# Patient Record
Sex: Male | Born: 1955
Health system: Southern US, Community
[De-identification: ages and names within clinical notes are randomized; demographics above are authoritative.]

## PROBLEM LIST (undated history)

## (undated) DIAGNOSIS — M1990 Unspecified osteoarthritis, unspecified site: Secondary | ICD-10-CM

## (undated) DIAGNOSIS — K9189 Other postprocedural complications and disorders of digestive system: Secondary | ICD-10-CM

## (undated) DIAGNOSIS — C4491 Basal cell carcinoma of skin, unspecified: Secondary | ICD-10-CM

## (undated) DIAGNOSIS — H9192 Unspecified hearing loss, left ear: Secondary | ICD-10-CM

## (undated) DIAGNOSIS — R001 Bradycardia, unspecified: Secondary | ICD-10-CM

## (undated) DIAGNOSIS — Z860100 Personal history of colon polyps, unspecified: Secondary | ICD-10-CM

## (undated) DIAGNOSIS — I10 Essential (primary) hypertension: Secondary | ICD-10-CM

## (undated) DIAGNOSIS — Q8789 Other specified congenital malformation syndromes, not elsewhere classified: Secondary | ICD-10-CM

## (undated) DIAGNOSIS — Z9289 Personal history of other medical treatment: Secondary | ICD-10-CM

## (undated) DIAGNOSIS — E785 Hyperlipidemia, unspecified: Secondary | ICD-10-CM

## (undated) DIAGNOSIS — E278 Other specified disorders of adrenal gland: Secondary | ICD-10-CM

## (undated) DIAGNOSIS — K567 Ileus, unspecified: Secondary | ICD-10-CM

## (undated) DIAGNOSIS — J189 Pneumonia, unspecified organism: Secondary | ICD-10-CM

## (undated) DIAGNOSIS — I499 Cardiac arrhythmia, unspecified: Secondary | ICD-10-CM

## (undated) DIAGNOSIS — C4442 Squamous cell carcinoma of skin of scalp and neck: Secondary | ICD-10-CM

## (undated) DIAGNOSIS — J9383 Other pneumothorax: Secondary | ICD-10-CM

## (undated) DIAGNOSIS — Z87442 Personal history of urinary calculi: Secondary | ICD-10-CM

## (undated) HISTORY — PX: TENDON REPAIR: SHX5111

## (undated) HISTORY — PX: NASAL SEPTUM SURGERY: SHX37

## (undated) HISTORY — DX: Essential (primary) hypertension: I10

## (undated) HISTORY — DX: Personal history of colon polyps, unspecified: Z86.0100

## (undated) HISTORY — PX: SKIN CANCER EXCISION: SHX779

## (undated) HISTORY — DX: Hyperlipidemia, unspecified: E78.5

## (undated) HISTORY — DX: Personal history of colonic polyps: Z86.010

## (undated) HISTORY — PX: HERNIA REPAIR: SHX51

## (undated) HISTORY — PX: LUNG SURGERY: SHX703

## (undated) HISTORY — DX: Bradycardia, unspecified: R00.1

## (undated) HISTORY — PX: KNEE ARTHROSCOPY: SHX127

---
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
## 1993-02-20 DIAGNOSIS — J9383 Other pneumothorax: Secondary | ICD-10-CM

## 1993-02-20 HISTORY — DX: Other pneumothorax: J93.83

## 1998-04-05 ENCOUNTER — Ambulatory Visit (HOSPITAL_COMMUNITY): Admission: RE | Admit: 1998-04-05 | Discharge: 1998-04-05 | Payer: Self-pay | Admitting: Psychiatry

## 1998-10-22 DIAGNOSIS — C4491 Basal cell carcinoma of skin, unspecified: Secondary | ICD-10-CM

## 1998-10-22 HISTORY — DX: Basal cell carcinoma of skin, unspecified: C44.91

## 1999-02-21 HISTORY — PX: ELBOW SURGERY: SHX618

## 2000-07-17 ENCOUNTER — Encounter (INDEPENDENT_AMBULATORY_CARE_PROVIDER_SITE_OTHER): Payer: Self-pay | Admitting: Specialist

## 2000-07-17 ENCOUNTER — Ambulatory Visit (HOSPITAL_BASED_OUTPATIENT_CLINIC_OR_DEPARTMENT_OTHER): Admission: RE | Admit: 2000-07-17 | Discharge: 2000-07-17 | Payer: Self-pay | Admitting: Otolaryngology

## 2002-02-20 HISTORY — PX: CHOLECYSTECTOMY: SHX55

## 2002-04-24 ENCOUNTER — Encounter: Payer: Self-pay | Admitting: Surgery

## 2002-04-30 ENCOUNTER — Observation Stay (HOSPITAL_COMMUNITY): Admission: RE | Admit: 2002-04-30 | Discharge: 2002-05-01 | Payer: Self-pay | Admitting: Surgery

## 2002-04-30 ENCOUNTER — Encounter (INDEPENDENT_AMBULATORY_CARE_PROVIDER_SITE_OTHER): Payer: Self-pay | Admitting: *Deleted

## 2002-04-30 ENCOUNTER — Encounter: Payer: Self-pay | Admitting: Surgery

## 2004-02-25 ENCOUNTER — Ambulatory Visit: Payer: Self-pay | Admitting: Internal Medicine

## 2004-06-27 ENCOUNTER — Encounter: Admission: RE | Admit: 2004-06-27 | Discharge: 2004-06-27 | Payer: Self-pay | Admitting: Internal Medicine

## 2004-06-27 ENCOUNTER — Ambulatory Visit: Payer: Self-pay | Admitting: Internal Medicine

## 2004-11-01 ENCOUNTER — Ambulatory Visit: Payer: Self-pay | Admitting: Internal Medicine

## 2005-02-24 ENCOUNTER — Ambulatory Visit: Payer: Self-pay | Admitting: Internal Medicine

## 2005-02-28 ENCOUNTER — Encounter: Admission: RE | Admit: 2005-02-28 | Discharge: 2005-02-28 | Payer: Self-pay | Admitting: Internal Medicine

## 2005-06-16 ENCOUNTER — Ambulatory Visit: Payer: Self-pay | Admitting: Internal Medicine

## 2005-06-20 HISTORY — PX: COLONOSCOPY: SHX174

## 2005-06-23 ENCOUNTER — Ambulatory Visit: Payer: Self-pay | Admitting: Internal Medicine

## 2005-07-03 ENCOUNTER — Ambulatory Visit: Payer: Self-pay | Admitting: Gastroenterology

## 2005-07-14 ENCOUNTER — Encounter (INDEPENDENT_AMBULATORY_CARE_PROVIDER_SITE_OTHER): Payer: Self-pay | Admitting: Specialist

## 2005-07-14 ENCOUNTER — Encounter: Payer: Self-pay | Admitting: Internal Medicine

## 2005-07-14 ENCOUNTER — Ambulatory Visit: Payer: Self-pay | Admitting: Gastroenterology

## 2005-07-18 ENCOUNTER — Encounter: Payer: Self-pay | Admitting: Internal Medicine

## 2005-12-22 ENCOUNTER — Ambulatory Visit: Payer: Self-pay | Admitting: Internal Medicine

## 2006-03-09 ENCOUNTER — Ambulatory Visit: Payer: Self-pay | Admitting: Internal Medicine

## 2006-07-03 ENCOUNTER — Ambulatory Visit: Payer: Self-pay | Admitting: Internal Medicine

## 2006-10-02 DIAGNOSIS — I10 Essential (primary) hypertension: Secondary | ICD-10-CM | POA: Insufficient documentation

## 2006-10-02 DIAGNOSIS — J309 Allergic rhinitis, unspecified: Secondary | ICD-10-CM | POA: Insufficient documentation

## 2006-10-25 ENCOUNTER — Ambulatory Visit: Payer: Self-pay | Admitting: Internal Medicine

## 2006-10-25 LAB — CONVERTED CEMR LAB
ALT: 31 units/L (ref 0–53)
AST: 21 units/L (ref 0–37)
Albumin: 4.2 g/dL (ref 3.5–5.2)
Alkaline Phosphatase: 66 units/L (ref 39–117)
BUN: 16 mg/dL (ref 6–23)
Basophils Absolute: 0 10*3/uL (ref 0.0–0.1)
Basophils Relative: 0.1 % (ref 0.0–1.0)
Bilirubin, Direct: 0.1 mg/dL (ref 0.0–0.3)
Blood in Urine, dipstick: NEGATIVE
CO2: 30 meq/L (ref 19–32)
Calcium: 9.4 mg/dL (ref 8.4–10.5)
Chloride: 104 meq/L (ref 96–112)
Cholesterol: 164 mg/dL (ref 0–200)
Creatinine, Ser: 1 mg/dL (ref 0.4–1.5)
Direct LDL: 123.1 mg/dL
Eosinophils Absolute: 0.4 10*3/uL (ref 0.0–0.6)
Eosinophils Relative: 7.3 % — ABNORMAL HIGH (ref 0.0–5.0)
GFR calc Af Amer: 101 mL/min
GFR calc non Af Amer: 83 mL/min
Glucose, Bld: 99 mg/dL (ref 70–99)
Glucose, Urine, Semiquant: NEGATIVE
HCT: 44.3 % (ref 39.0–52.0)
HDL: 26.4 mg/dL — ABNORMAL LOW (ref >39.0)
Hemoglobin: 15.5 g/dL (ref 13.0–17.0)
Ketones, urine, test strip: NEGATIVE
LDL Cholesterol: 120 mg/dL — ABNORMAL HIGH (ref 0–99)
Lymphocytes Relative: 42.2 % (ref 12.0–46.0)
MCHC: 35.1 g/dL (ref 30.0–36.0)
MCV: 91.3 fL (ref 78.0–100.0)
Monocytes Absolute: 0.4 10*3/uL (ref 0.2–0.7)
Monocytes Relative: 8.4 % (ref 3.0–11.0)
Neutro Abs: 2.1 10*3/uL (ref 1.4–7.7)
Neutrophils Relative %: 42 % — ABNORMAL LOW (ref 43.0–77.0)
Nitrite: NEGATIVE
PSA: 0.5 ng/mL (ref 0.10–4.00)
Platelets: 206 10*3/uL (ref 150–400)
Potassium: 3.4 meq/L — ABNORMAL LOW (ref 3.5–5.1)
Protein, U semiquant: NEGATIVE
RBC: 4.86 M/uL (ref 4.22–5.81)
RDW: 12.7 % (ref 11.5–14.6)
Sodium: 141 meq/L (ref 135–145)
Specific Gravity, Urine: >=1.03
TSH: 2.16 microintl units/mL (ref 0.35–5.50)
Total Bilirubin: 1 mg/dL (ref 0.3–1.2)
Total CHOL/HDL Ratio: 6.2
Total Protein: 7.1 g/dL (ref 6.0–8.3)
Triglycerides: 88 mg/dL (ref 0–149)
Urobilinogen, UA: 0.2
VLDL: 18 mg/dL (ref 0–40)
WBC Urine, dipstick: NEGATIVE
WBC: 5 10*3/uL (ref 4.5–10.5)
pH: 5.5

## 2006-11-02 ENCOUNTER — Ambulatory Visit: Payer: Self-pay | Admitting: Internal Medicine

## 2007-06-18 ENCOUNTER — Emergency Department (HOSPITAL_COMMUNITY): Admission: EM | Admit: 2007-06-18 | Discharge: 2007-06-18 | Payer: Self-pay | Admitting: Emergency Medicine

## 2007-08-26 ENCOUNTER — Ambulatory Visit: Payer: Self-pay | Admitting: Internal Medicine

## 2007-08-26 DIAGNOSIS — J310 Chronic rhinitis: Secondary | ICD-10-CM

## 2007-09-25 ENCOUNTER — Ambulatory Visit: Payer: Self-pay | Admitting: Internal Medicine

## 2007-10-22 ENCOUNTER — Ambulatory Visit: Payer: Self-pay | Admitting: Internal Medicine

## 2007-10-22 LAB — CONVERTED CEMR LAB
ALT: 26 units/L (ref 0–53)
AST: 19 units/L (ref 0–37)
Albumin: 3.8 g/dL (ref 3.5–5.2)
BUN: 14 mg/dL (ref 6–23)
Basophils Relative: 0.1 % (ref 0.0–3.0)
CO2: 33 meq/L — ABNORMAL HIGH (ref 19–32)
Chloride: 110 meq/L (ref 96–112)
Creatinine, Ser: 0.9 mg/dL (ref 0.4–1.5)
Eosinophils Absolute: 0.3 10*3/uL (ref 0.0–0.7)
Eosinophils Relative: 6.1 % — ABNORMAL HIGH (ref 0.0–5.0)
GFR calc non Af Amer: 94 mL/min
Glucose, Urine, Semiquant: NEGATIVE
Hemoglobin: 15.2 g/dL (ref 13.0–17.0)
MCV: 91.4 fL (ref 78.0–100.0)
Neutrophils Relative %: 56.2 % (ref 43.0–77.0)
PSA: 0.59 ng/mL (ref 0.10–4.00)
RBC: 4.6 M/uL (ref 4.22–5.81)
Specific Gravity, Urine: 1.02
VLDL: 19 mg/dL (ref 0–40)
WBC Urine, dipstick: NEGATIVE
WBC: 5.3 10*3/uL (ref 4.5–10.5)
pH: 5

## 2007-10-29 ENCOUNTER — Ambulatory Visit: Payer: Self-pay | Admitting: Internal Medicine

## 2007-10-29 DIAGNOSIS — E785 Hyperlipidemia, unspecified: Secondary | ICD-10-CM

## 2008-02-24 ENCOUNTER — Ambulatory Visit: Payer: Self-pay | Admitting: Internal Medicine

## 2008-02-24 DIAGNOSIS — J069 Acute upper respiratory infection, unspecified: Secondary | ICD-10-CM | POA: Insufficient documentation

## 2008-03-27 ENCOUNTER — Ambulatory Visit: Payer: Self-pay | Admitting: Internal Medicine

## 2008-04-10 ENCOUNTER — Ambulatory Visit: Payer: Self-pay | Admitting: Internal Medicine

## 2008-04-10 DIAGNOSIS — I498 Other specified cardiac arrhythmias: Secondary | ICD-10-CM

## 2008-04-13 ENCOUNTER — Telehealth: Payer: Self-pay | Admitting: Internal Medicine

## 2008-04-13 ENCOUNTER — Emergency Department (HOSPITAL_COMMUNITY): Admission: EM | Admit: 2008-04-13 | Discharge: 2008-04-13 | Payer: Self-pay | Admitting: Emergency Medicine

## 2008-04-17 ENCOUNTER — Ambulatory Visit: Payer: Self-pay | Admitting: Internal Medicine

## 2008-04-17 DIAGNOSIS — R079 Chest pain, unspecified: Secondary | ICD-10-CM

## 2008-04-20 HISTORY — PX: OTHER SURGICAL HISTORY: SHX169

## 2008-04-23 ENCOUNTER — Encounter: Payer: Self-pay | Admitting: Internal Medicine

## 2008-04-23 ENCOUNTER — Ambulatory Visit: Payer: Self-pay

## 2008-10-23 ENCOUNTER — Ambulatory Visit: Payer: Self-pay | Admitting: Internal Medicine

## 2008-10-23 LAB — CONVERTED CEMR LAB
ALT: 34 units/L (ref 0–53)
AST: 22 units/L (ref 0–37)
Alkaline Phosphatase: 70 units/L (ref 39–117)
Eosinophils Relative: 4.7 % (ref 0.0–5.0)
GFR calc non Af Amer: 82.69 mL/min (ref >60)
HCT: 46.7 % (ref 39.0–52.0)
Hemoglobin: 16.5 g/dL (ref 13.0–17.0)
LDL Cholesterol: 140 mg/dL — ABNORMAL HIGH (ref 0–99)
Lymphocytes Relative: 32.5 % (ref 12.0–46.0)
Lymphs Abs: 1.8 10*3/uL (ref 0.7–4.0)
Monocytes Relative: 7.2 % (ref 3.0–12.0)
Neutro Abs: 3 10*3/uL (ref 1.4–7.7)
Nitrite: NEGATIVE
Platelets: 193 10*3/uL (ref 150.0–400.0)
Potassium: 3.8 meq/L (ref 3.5–5.1)
Protein, U semiquant: NEGATIVE
Sodium: 142 meq/L (ref 135–145)
TSH: 2.01 microintl units/mL (ref 0.35–5.50)
Total Bilirubin: 0.9 mg/dL (ref 0.3–1.2)
Urobilinogen, UA: 0.2
VLDL: 16.4 mg/dL (ref 0.0–40.0)
WBC Urine, dipstick: NEGATIVE
WBC: 5.5 10*3/uL (ref 4.5–10.5)

## 2008-10-30 ENCOUNTER — Ambulatory Visit: Payer: Self-pay | Admitting: Internal Medicine

## 2008-12-31 ENCOUNTER — Ambulatory Visit: Payer: Self-pay | Admitting: Internal Medicine

## 2008-12-31 DIAGNOSIS — M549 Dorsalgia, unspecified: Secondary | ICD-10-CM | POA: Insufficient documentation

## 2009-03-01 ENCOUNTER — Ambulatory Visit: Payer: Self-pay | Admitting: Internal Medicine

## 2009-04-30 ENCOUNTER — Ambulatory Visit: Payer: Self-pay | Admitting: Internal Medicine

## 2009-10-21 ENCOUNTER — Ambulatory Visit: Payer: Self-pay | Admitting: Internal Medicine

## 2009-10-21 LAB — CONVERTED CEMR LAB
AST: 19 units/L (ref 0–37)
Albumin: 4.3 g/dL (ref 3.5–5.2)
BUN: 19 mg/dL (ref 6–23)
Basophils Absolute: 0 10*3/uL (ref 0.0–0.1)
Blood in Urine, dipstick: NEGATIVE
Cholesterol: 181 mg/dL (ref 0–200)
Creatinine, Ser: 1 mg/dL (ref 0.4–1.5)
Eosinophils Absolute: 0.3 10*3/uL (ref 0.0–0.7)
HCT: 47.4 % (ref 39.0–52.0)
HDL: 30.9 mg/dL — ABNORMAL LOW (ref >39.00)
Hemoglobin: 16.5 g/dL (ref 13.0–17.0)
Ketones, urine, test strip: NEGATIVE
LDL Cholesterol: 130 mg/dL — ABNORMAL HIGH (ref 0–99)
Lymphs Abs: 1.9 10*3/uL (ref 0.7–4.0)
MCHC: 34.7 g/dL (ref 30.0–36.0)
MCV: 91.9 fL (ref 78.0–100.0)
Monocytes Relative: 6.4 % (ref 3.0–12.0)
Neutro Abs: 3.1 10*3/uL (ref 1.4–7.7)
Nitrite: NEGATIVE
Protein, U semiquant: NEGATIVE
RDW: 13.2 % (ref 11.5–14.6)
TSH: 2.62 microintl units/mL (ref 0.35–5.50)
Total CHOL/HDL Ratio: 6
Triglycerides: 103 mg/dL (ref 0.0–149.0)
Urobilinogen, UA: 0.2
VLDL: 20.6 mg/dL (ref 0.0–40.0)

## 2009-11-01 ENCOUNTER — Ambulatory Visit: Payer: Self-pay | Admitting: Internal Medicine

## 2009-12-29 ENCOUNTER — Ambulatory Visit: Payer: Self-pay | Admitting: Internal Medicine

## 2009-12-29 DIAGNOSIS — L308 Other specified dermatitis: Secondary | ICD-10-CM

## 2010-01-17 ENCOUNTER — Ambulatory Visit: Payer: Self-pay | Admitting: Internal Medicine

## 2010-03-13 ENCOUNTER — Encounter: Payer: Self-pay | Admitting: Internal Medicine

## 2010-03-24 NOTE — Assessment & Plan Note (Signed)
Summary: 6 month rov/njr   Vital Signs:  Patient profile:   55 year old male Weight:      202 pounds Temp:     98. degrees F oral BP sitting:   130 / 94  (right arm) Cuff size:   regular  Vitals Entered By: Duard Brady LPN (April 30, 2009 8:12 AM) CC: 6 mos rov - doing well Is Patient Diabetic? No   CC:  6 mos rov - doing well.  History of Present Illness: 55 year old patient who is seen today for follow-up of his hypertension.  He is doing quite well.  He does have a history of allergic rhinitis.  He has mild dyslipidemia.  He is done well with his hypertension and does monitor a home blood pressures occasionally.  He is tolerant.  His medications without difficulty.  he is  only having minimal allergy related symptoms.  Preventive Screening-Counseling & Management  Alcohol-Tobacco     Smoking Status: current  Allergies (verified): No Known Drug Allergies  Past History:  Past Medical History: Reviewed history from 12/31/2008 and no changes required. Allergic rhinitis Hypertension Colonic polyps, hx of Hyperlipidemia bradycardia  Low HDL cholesterol  Past Surgical History: Reviewed history from 10/30/2008 and no changes required. spontaneous pneumothorax  10/05 Cholecystectomy 2004 ear surgery 1971 colonoscopy 5-07 Cardiolyte Stress 3-10  Social History: Smoking Status:  current  Review of Systems  The patient denies anorexia, fever, weight loss, weight gain, vision loss, decreased hearing, hoarseness, chest pain, syncope, dyspnea on exertion, peripheral edema, prolonged cough, headaches, hemoptysis, abdominal pain, melena, hematochezia, severe indigestion/heartburn, hematuria, incontinence, genital sores, muscle weakness, suspicious skin lesions, transient blindness, difficulty walking, depression, unusual weight change, abnormal bleeding, enlarged lymph nodes, angioedema, breast masses, and testicular masses.    Physical Exam  General:   Well-developed,well-nourished,in no acute distress; alert,appropriate and cooperative throughout examination; 130/90 Head:  Normocephalic and atraumatic without obvious abnormalities. No apparent alopecia or balding. Eyes:  No corneal or conjunctival inflammation noted. EOMI. Perrla. Funduscopic exam benign, without hemorrhages, exudates or papilledema. Vision grossly normal. Mouth:  Oral mucosa and oropharynx without lesions or exudates.  Teeth in good repair. Neck:  No deformities, masses, or tenderness noted. Lungs:  Normal respiratory effort, chest expands symmetrically. Lungs are clear to auscultation, no crackles or wheezes. Heart:  Normal rate and regular rhythm. S1 and S2 normal without gallop, murmur, click, rub or other extra sounds. Abdomen:  Bowel sounds positive,abdomen soft and non-tender without masses, organomegaly or hernias noted. Msk:  No deformity or scoliosis noted of thoracic or lumbar spine.   Pulses:  R and L carotid,radial,femoral,dorsalis pedis and posterior tibial pulses are full and equal bilaterally Extremities:  No clubbing, cyanosis, edema, or deformity noted with normal full range of motion of all joints.     Impression & Recommendations:  Problem # 1:  HYPERLIPIDEMIA (ICD-272.4)  Problem # 2:  HYPERTENSION (ICD-401.9)  His updated medication list for this problem includes:    Lisinopril-hydrochlorothiazide 20-25 Mg Tabs (Lisinopril-hydrochlorothiazide) ..... One daily  His updated medication list for this problem includes:    Lisinopril-hydrochlorothiazide 20-25 Mg Tabs (Lisinopril-hydrochlorothiazide) ..... One daily  Orders: Prescription Created Electronically [phone])  Problem # 3:  ALLERGIC RHINITIS (ICD-477.9)  His updated medication list for this problem includes:    Zyrtec Allergy 10 Mg Tabs (Cetirizine hcl) .Marland Kitchen... 1 qd as needed    Fluticasone Propionate 50 Mcg/act Susp (Fluticasone propionate) .Marland Kitchen..Marland Kitchen Two puffs each side twice daily for one week,  then once daily  His  updated medication list for this problem includes:    Zyrtec Allergy 10 Mg Tabs (Cetirizine hcl) .Marland Kitchen... 1 qd as needed    Fluticasone Propionate 50 Mcg/act Susp (Fluticasone propionate) .Marland Kitchen..Marland Kitchen Two puffs each side twice daily for one week, then once daily  Complete Medication List: 1)  Zyrtec Allergy 10 Mg Tabs (Cetirizine hcl) .Marland Kitchen.. 1 qd as needed 2)  Adult Aspirin Low Strength 81 Mg Tbdp (Aspirin) .Marland Kitchen.. 1 once daily 3)  Fluticasone Propionate 50 Mcg/act Susp (Fluticasone propionate) .... Two puffs each side twice daily for one week, then once daily 4)  Lisinopril-hydrochlorothiazide 20-25 Mg Tabs (Lisinopril-hydrochlorothiazide) .... One daily  Patient Instructions: 1)  Please schedule a follow-up appointment in 6 months. 2)  Limit your Sodium (Salt). 3)  It is important that you exercise regularly at least 20 minutes 5 times a week. If you develop chest pain, have severe difficulty breathing, or feel very tired , stop exercising immediately and seek medical attention. Prescriptions: LISINOPRIL-HYDROCHLOROTHIAZIDE 20-25 MG TABS (LISINOPRIL-HYDROCHLOROTHIAZIDE) one daily  #90 x 6   Entered and Authorized by:   Gordy Savers  MD   Signed by:   Gordy Savers  MD on 04/30/2009   Method used:   Print then Give to Patient   RxID:   [id number] FLUTICASONE PROPIONATE 50 MCG/ACT  SUSP (FLUTICASONE PROPIONATE) two puffs each side twice daily for one week, then once daily  #3 x 6   Entered and Authorized by:   Gordy Savers  MD   Signed by:   Gordy Savers  MD on 04/30/2009   Method used:   Print then Give to Patient   RxID:   [id number] ZYRTEC ALLERGY 10 MG  TABS (CETIRIZINE HCL) 1 qd as needed  #90 x 6   Entered and Authorized by:   Gordy Savers  MD   Signed by:   Gordy Savers  MD on 04/30/2009   Method used:   Print then Give to Patient   RxID:   [id number] LISINOPRIL-HYDROCHLOROTHIAZIDE 20-25 MG TABS  (LISINOPRIL-HYDROCHLOROTHIAZIDE) one daily  #90 x 6   Entered and Authorized by:   Gordy Savers  MD   Signed by:   Gordy Savers  MD on 04/30/2009   Method used:   Electronically to        [facility]* (retail)       [address]       [address] Shipping/mailing       [place], [place]  [address]       Ph: [phone]       Fax: [phone]   RxID:   [id number] FLUTICASONE PROPIONATE 50 MCG/ACT  SUSP (FLUTICASONE PROPIONATE) two puffs each side twice daily for one week, then once daily  #3 x 6   Entered and Authorized by:   Gordy Savers  MD   Signed by:   Gordy Savers  MD on 04/30/2009   Method used:   Electronically to        [facility]* (retail)       [address]       [address] Shipping/mailing       [place], [place]  [address]       Ph: [phone]       Fax: [phone]   RxID:   [id number] ZYRTEC ALLERGY 10 MG  TABS (CETIRIZINE HCL) 1 qd as needed  #90 x 6   Entered  and Authorized by:   Gordy Savers  MD   Signed by:   Gordy Savers  MD on 04/30/2009   Method used:   Electronically to        [facility]* (retail)       [address]       [address] Shipping/mailing       [place], [place]  [address]       Ph: [phone]       Fax: [phone]   RxID:   [id number]

## 2010-03-24 NOTE — Assessment & Plan Note (Signed)
Summary: ?shingles/njr   Vital Signs:  Patient profile:   55 year old male Weight:      208 pounds Temp:     97.6 degrees F oral BP sitting:   140 / 96  (left arm) Cuff size:   regular  Vitals Entered By: Raechel Ache, RN (March 01, 2009 11:19 AM) CC: C/o sharp back pain, itching and stinging R side abdomen.   CC:  C/o sharp back pain and itching and stinging R side abdomen.Marland Kitchen  History of Present Illness: 55 year old patient who had the sudden onset of sharp mid back pain with radiation along both flank areas.  Three days ago.  Intense pain lasted for a few minutes and then resolved completely after approximately 1 hour.  The following day he developed a burning sensation involving the right flank area.  There is some concern about shingles although there has been no rash.  Pain is described as fairly minor feeling much as a mild sunburn  Allergies: No Known Drug Allergies  Physical Exam  General:  Well-developed,well-nourished,in no acute distress; alert,appropriate and cooperative throughout examination Neck:  No deformities, masses, or tenderness noted. Chest Wall:  No deformities, masses, tenderness or gynecomastia noted. Abdomen:  Bowel sounds positive,abdomen soft and non-tender without masses, organomegaly or hernias noted. Msk:  no tenderness over the spinous processes Neurologic:  strength normal in all extremities, sensation intact to light touch, and sensation intact to pinprick.     Impression & Recommendations:  Problem # 1:  BACK PAIN (ICD-724.5)  His updated medication list for this problem includes:    Adult Aspirin Low Strength 81 Mg Tbdp (Aspirin) .Marland Kitchen... 1 once daily believed that the right flank discomfort is more of a neuralgia, and there is no evidence of shingles at this time  Problem # 2:  HYPERTENSION (ICD-401.9)  His updated medication list for this problem includes:    Lisinopril-hydrochlorothiazide 20-25 Mg Tabs (Lisinopril-hydrochlorothiazide)  ..... One daily  Complete Medication List: 1)  Zyrtec Allergy 10 Mg Tabs (Cetirizine hcl) .Marland Kitchen.. 1 qd as needed 2)  Adult Aspirin Low Strength 81 Mg Tbdp (Aspirin) .Marland Kitchen.. 1 once daily 3)  Fluticasone Propionate 50 Mcg/act Susp (Fluticasone propionate) .... Two puffs each side twice daily for one week, then once daily 4)  Lisinopril-hydrochlorothiazide 20-25 Mg Tabs (Lisinopril-hydrochlorothiazide) .... One daily  Patient Instructions: 1)  call if  the pain intensifies or if you develop any rash. 2)  Please schedule a follow-up appointment in 3 months.

## 2010-03-24 NOTE — Assessment & Plan Note (Signed)
Summary: RASH//CCM   Vital Signs:  Patient profile:   55 year old male Weight:      203 pounds Temp:     98.0 degrees F oral  Vitals Entered By: Duard Brady LPN (January 17, 2010 11:05 AM) CC: c/o rash all over has come back , ?pink eye(R) , leg and hand cramps , head congestion Is Patient Diabetic? No   CC:  c/o rash all over has come back , ?pink eye(R) , leg and hand cramps , and head congestion.  History of Present Illness: 55 year old patient who is seen today for evaluation of persistent rash.  He was seen earlier in two to with a brief course of steroids that resulted in total resolution of the rash.  The rash has return since the discontinuation of the oral prednisone.  His blood pressure regimen includes hydrochlorothiazide  Allergies: 1)  ! Niacin  Past History:  Past Medical History: Reviewed history from 12/31/2008 and no changes required. Allergic rhinitis Hypertension Colonic polyps, hx of Hyperlipidemia bradycardia  Low HDL cholesterol  Review of Systems       The patient complains of suspicious skin lesions.  The patient denies anorexia, fever, weight loss, weight gain, vision loss, decreased hearing, hoarseness, chest pain, syncope, dyspnea on exertion, peripheral edema, prolonged cough, headaches, hemoptysis, abdominal pain, melena, hematochezia, severe indigestion/heartburn, hematuria, incontinence, genital sores, muscle weakness, transient blindness, difficulty walking, depression, unusual weight change, abnormal bleeding, enlarged lymph nodes, angioedema, breast masses, and testicular masses.    Physical Exam  General:  Well-developed,well-nourished,in no acute distress; alert,appropriate and cooperative throughout examination; normal blood pressure Skin:  scattered, erythematous, macular rash.  There are patchy areas of dry, scaly, erythematous patches, most marked over the arms   Impression & Recommendations:  Problem # 1:  DERMATITIS  (ICD-692.9)  His updated medication list for this problem includes:    Zyrtec Allergy 10 Mg Tabs (Cetirizine hcl) .Marland Kitchen... 1 qd as needed    Prednisone 20 Mg Tabs (Prednisone) ..... One twice daily will discontinue hydrochlorothiazide, and retreat with oral prednisone.  If rash fails to improve or recur.  Is off steroid therapy.  Will refer to dermatology  Problem # 2:  HYPERTENSION (ICD-401.9)  The following medications were removed from the medication list:    Lisinopril-hydrochlorothiazide 20-25 Mg Tabs (Lisinopril-hydrochlorothiazide) ..... One daily His updated medication list for this problem includes:    Lisinopril 40 Mg Tabs (Lisinopril) ..... One daily  Complete Medication List: 1)  Zyrtec Allergy 10 Mg Tabs (Cetirizine hcl) .Marland Kitchen.. 1 qd as needed 2)  Adult Aspirin Low Strength 81 Mg Tbdp (Aspirin) .Marland Kitchen.. 1 once daily 3)  Fluticasone Propionate 50 Mcg/act Susp (Fluticasone propionate) .... Two puffs each side twice daily for one week, then once daily 4)  Fish Oil 1000 Mg Caps (Omega-3 fatty acids) .... 2 by mouth qd 5)  Prednisone 20 Mg Tabs (Prednisone) .... One twice daily 6)  Lisinopril 40 Mg Tabs (Lisinopril) .... One daily 7)  Sulfacetamide Sodium 10 % Soln (Sulfacetamide sodium) .... 2 drops od qid  Patient Instructions: 1)  please call for a dermatology referral if rash persists Prescriptions: SULFACETAMIDE SODIUM 10 % SOLN (SULFACETAMIDE SODIUM) 2 drops OD QID  #10 cc x 0   Entered and Authorized by:   Gordy Savers  MD   Signed by:   Gordy Savers  MD on 01/17/2010   Method used:   Print then Give to Patient   RxID:   [id number] LISINOPRIL 40 MG  TABS (LISINOPRIL) one daily  #90 x 4   Entered and Authorized by:   Gordy Savers  MD   Signed by:   Gordy Savers  MD on 01/17/2010   Method used:   Print then Give to Patient   RxID:   [id number] PREDNISONE 20 MG TABS (PREDNISONE) one twice daily  #14 x 0   Entered and Authorized by:   Gordy Savers  MD   Signed by:   Gordy Savers  MD on 01/17/2010   Method used:   Print then Give to Patient   RxID:   [id number]    Orders Added: 1)  Est. Patient Level III [84696]

## 2010-03-24 NOTE — Assessment & Plan Note (Signed)
Summary: RASH // RS   Vital Signs:  Patient profile:   55 year old male Weight:      200 pounds Temp:     98.1 degrees F oral BP sitting:   140 / 88  (right arm) Cuff size:   regular  Vitals Entered By: Duard Brady LPN (December 29, 2009 10:22 AM) CC: c/o rash and itching all over x3ks Is Patient Diabetic? No  Flu Vaccine Consent Questions     Do you have a history of severe allergic reactions to this vaccine? no    Any prior history of allergic reactions to egg and/or gelatin? no    Do you have a sensitivity to the preservative Thimersol? no    Do you have a past history of Guillan-Barre Syndrome? no    Do you currently have an acute febrile illness? no    Have you ever had a severe reaction to latex? no    Vaccine information given and explained to patient? yes    Are you currently pregnant? no    Lot Number:[id number]   Exp Date:08/20/2010   Site Given  Left Deltoid IM  CC:  c/o rash and itching all over x3ks.  History of Present Illness: 55 year old patient who presents with a 3-week history of a generalized pruritic rash.  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  There is been no change in his medications and he has been on a lisinopril, hydrochlorothiazide regimen for a number of years.  He does switch detergents, soaps deodorants etc. frequently based on cost consideration,  the rash is fairly  generalized except spares the face  Preventive Screening-Counseling & Management  Alcohol-Tobacco     Smoking Status: current  Allergies: 1)  ! Niacin  Past History:  Past Medical History: Reviewed history from 12/31/2008 and no changes required. Allergic rhinitis Hypertension Colonic polyps, hx of Hyperlipidemia bradycardia  Low HDL cholesterol  Review of Systems       The patient complains of suspicious skin lesions.  The patient denies anorexia, fever, weight loss, weight gain, vision loss, decreased hearing, hoarseness, chest pain, syncope,  dyspnea on exertion, peripheral edema, prolonged cough, headaches, hemoptysis, abdominal pain, melena, hematochezia, severe indigestion/heartburn, hematuria, incontinence, genital sores, muscle weakness, transient blindness, difficulty walking, depression, unusual weight change, abnormal bleeding, enlarged lymph nodes, angioedema, breast masses, and testicular masses.    Physical Exam  General:  Well-developed,well-nourished,in no acute distress; alert,appropriate and cooperative throughout examination Eyes:  No corneal or conjunctival inflammation noted. EOMI. Perrla. Funduscopic exam benign, without hemorrhages, exudates or papilledema. Vision grossly normal. Ears:  External ear exam shows no significant lesions or deformities.  Otoscopic examination reveals clear canals, tympanic membranes are intact bilaterally without bulging, retraction, inflammation or discharge. Hearing is grossly normal bilaterally. Mouth:  Oral mucosa and oropharynx without lesions or exudates.  Teeth in good repair. Neck:  No deformities, masses, or tenderness noted. Lungs:  Normal respiratory effort, chest expands symmetrically. Lungs are clear to auscultation, no crackles or wheezes. Skin:  there is generalized patchy, erythematous rash, most marked over the extremities   Impression & Recommendations:  Problem # 1:  DERMATITIS (ICD-692.9)  His updated medication list for this problem includes:    Zyrtec Allergy 10 Mg Tabs (Cetirizine hcl) .Marland Kitchen... 1 qd as needed    Prednisone 20 Mg Tabs (Prednisone) ..... One twice daily  Problem # 2:  HYPERTENSION (ICD-401.9)  His updated medication list for this problem includes:    Lisinopril-hydrochlorothiazide  20-25 Mg Tabs (Lisinopril-hydrochlorothiazide) ..... One daily  Complete Medication List: 1)  Zyrtec Allergy 10 Mg Tabs (Cetirizine hcl) .Marland Kitchen.. 1 qd as needed 2)  Adult Aspirin Low Strength 81 Mg Tbdp (Aspirin) .Marland Kitchen.. 1 once daily 3)  Fluticasone Propionate 50 Mcg/act  Susp (Fluticasone propionate) .... Two puffs each side twice daily for one week, then once daily 4)  Lisinopril-hydrochlorothiazide 20-25 Mg Tabs (Lisinopril-hydrochlorothiazide) .... One daily 5)  Fish Oil 1000 Mg Caps (Omega-3 fatty acids) .... 2 by mouth qd 6)  Prednisone 20 Mg Tabs (Prednisone) .... One twice daily  Other Orders: Admin 1st Vaccine ([address]) Flu Vaccine 80yrs + ([address])  Patient Instructions: 1)  Please schedule a follow-up appointment as needed. Prescriptions: PREDNISONE 20 MG TABS (PREDNISONE) one twice daily  #14 x 0   Entered and Authorized by:   Gordy Savers  MD   Signed by:   Gordy Savers  MD on 12/29/2009   Method used:   Electronically to        [facility]* (retail)       [address]       [address] Shipping/mailing       [place], [place]  [address]       Ph: [phone]       Fax: [phone]   RxID:   [id number]    Orders Added: 1)  Admin 1st Vaccine [90471] 2)  Flu Vaccine 42yrs + [27253] 3)  Est. Patient Level III [66440]     Appended Document: RASH // RS     Allergies: 1)  ! Niacin   Complete Medication List: 1)  Zyrtec Allergy 10 Mg Tabs (Cetirizine hcl) .Marland Kitchen.. 1 qd as needed 2)  Adult Aspirin Low Strength 81 Mg Tbdp (Aspirin) .Marland Kitchen.. 1 once daily 3)  Fluticasone Propionate 50 Mcg/act Susp (Fluticasone propionate) .... Two puffs each side twice daily for one week, then once daily 4)  Lisinopril-hydrochlorothiazide 20-25 Mg Tabs (Lisinopril-hydrochlorothiazide) .... One daily 5)  Fish Oil 1000 Mg Caps (Omega-3 fatty acids) .... 2 by mouth qd 6)  Prednisone 20 Mg Tabs (Prednisone) .... One twice daily  Other Orders: Depo- Medrol 80mg  (J1040) Admin of Therapeutic Inj  intramuscular or subcutaneous (34742)   Medication Administration  Injection # 1:    Medication: Depo- Medrol 80mg     Diagnosis: DERMATITIS (ICD-692.9)    Route: IM    Site: R deltoid    Exp Date: 06/2012    Lot #: [id number]     Mfr: Pharmacia    Patient tolerated injection without complications    Given by: Duard Brady LPN (December 29, 2009 12:25 PM)  Orders Added: 1)  Depo- Medrol 80mg  [J1040] 2)  Admin of Therapeutic Inj  intramuscular or subcutaneous [59563]

## 2010-03-24 NOTE — Assessment & Plan Note (Signed)
Summary: CPX // RS   Vital Signs:  Hudson profile:   55 year old male Height:      70 inches Weight:      191 pounds BMI:     27.50 Temp:     98.2 degrees F oral BP sitting:   110 / 70  (right arm) Cuff size:   regular  Vitals Entered By: Duard Brady LPN (November 01, 2009 2:36 PM) CC: cpx -  Is Hudson Diabetic? No   CC:  cpx - .  History of Present Illness: Ernest Hudson who is seen today for a wellness exam.  He has a history of treated hypertension, low HDL cholesterol.  He also has a history of allergic rhinitis.  He has done quite well.  laboratory studies were reviewed  Preventive Screening-Counseling & Management  Alcohol-Tobacco     Smoking Status: current  Allergies: 1)  ! Niacin  Past History:  Past Medical History: Reviewed history from 12/31/2008 and no changes required. Allergic rhinitis Hypertension Colonic polyps, hx of Hyperlipidemia bradycardia  Low HDL cholesterol  Past Surgical History: Reviewed history from 10/30/2008 and no changes required. spontaneous pneumothorax  10/05 Cholecystectomy 2004 ear surgery 1971 colonoscopy 5-07 Cardiolyte Stress 3-10  Family History: Reviewed history from 10/29/2007 and no changes required. father status post MI, history of diabetes. prostate ca  Mother, status post breast cancer, history of diabetes two brothers and one sister in good health  Social History: Reviewed history from 10/29/2007 and no changes required. 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    Physical Exam  General:  Well-developed,well-nourished,in no acute distress; alert,appropriate and cooperative throughout examination; 120/80 Head:  Normocephalic and atraumatic without obvious abnormalities. No apparent alopecia or balding. Eyes:  No corneal or conjunctival inflammation noted. EOMI. Perrla. Funduscopic exam benign, without hemorrhages, exudates or papilledema. Vision grossly normal. Nose:  External nasal examination shows no deformity or inflammation. Nasal mucosa are pink and moist without lesions or exudates. Mouth:  Oral mucosa and oropharynx without lesions or exudates.  Teeth in good repair. Neck:  No deformities, masses, or tenderness noted. Chest Wall:  No deformities, masses, tenderness or gynecomastia noted. Breasts:  No masses or gynecomastia noted Lungs:  Normal respiratory effort, chest expands symmetrically. Lungs are clear to auscultation, no crackles or wheezes. Heart:  Normal rate and regular rhythm. S1 and S2 normal without gallop, murmur, click, rub or other extra sounds. Abdomen:  Bowel sounds positive,abdomen soft and non-tender without masses, organomegaly or hernias noted. Rectal:  No external abnormalities noted. Normal sphincter tone. No rectal masses or tenderness. Genitalia:  Testes bilaterally descended without nodularity, tenderness or masses. No scrotal masses or lesions. No penis lesions or urethral discharge. Prostate:  Prostate gland firm and smooth, no enlargement, nodularity, tenderness, mass, asymmetry or induration. Msk:  No deformity or scoliosis noted of thoracic or lumbar spine.   Pulses:  R and L carotid,radial,femoral,dorsalis pedis and posterior tibial pulses are full and equal bilaterally Extremities:  No clubbing, cyanosis, edema, or deformity noted with normal full range of  motion of all  joints.   Neurologic:  No cranial nerve deficits noted. Station and gait are normal. Plantar reflexes are down-going bilaterally. DTRs are symmetrical throughout. Sensory, motor and coordinative functions appear intact. Skin:  Intact without suspicious lesions or rashes Cervical Nodes:  No lymphadenopathy noted Axillary Nodes:  No palpable lymphadenopathy Inguinal Nodes:  No significant adenopathy Psych:  Cognition and judgment appear intact. Alert and cooperative with normal attention span and concentration. No apparent delusions, illusions, hallucinations   Impression & Recommendations:  Problem # 1:  PREVENTIVE HEALTH CARE (ICD-V70.0)  Complete Medication List: 1)  Zyrtec Allergy 10 Mg Tabs (Cetirizine hcl) .Marland Kitchen.. 1 qd as needed 2)  Adult Aspirin Low Strength 81 Mg Tbdp (Aspirin) .Marland Kitchen.. 1 once daily 3)  Fluticasone Propionate 50 Mcg/act Susp (Fluticasone propionate) .... Two puffs each side twice daily for one week, then once daily 4)  Lisinopril-hydrochlorothiazide 20-25 Mg Tabs (Lisinopril-hydrochlorothiazide) .... One daily 5)  Fish Oil 1000 Mg Caps (Omega-3 fatty acids) .... 2 by mouth qd  Hudson Instructions: 1)  Please schedule a follow-up appointment in 1 year. 2)  Limit your Sodium (Salt). 3)  It is important that you exercise regularly at least 20 minutes 5 times a week. If you develop chest pain, have severe difficulty breathing, or feel very tired , stop exercising immediately and seek medical attention. 4)  You need to lose weight. Consider a lower calorie diet and regular exercise.  5)  Check your Blood Pressure regularly. If it is above: 150/90 you should make an appointment. Prescriptions: LISINOPRIL-HYDROCHLOROTHIAZIDE 20-25 MG TABS (LISINOPRIL-HYDROCHLOROTHIAZIDE) one daily  #90 x 6   Entered and Authorized by:   Gordy Savers  MD   Signed by:   Gordy Savers  MD on 11/01/2009   Method used:   Electronically to        [facility]* (retail)       [address]       [address] Shipping/mailing       [place], [place]  [address]       Ph: [phone]       Fax: [phone]   RxID:   [id number] FLUTICASONE PROPIONATE 50 MCG/ACT  SUSP (FLUTICASONE PROPIONATE) two puffs each side twice daily for one week, then once daily  #3 x 6   Entered and Authorized by:   Gordy Savers  MD   Signed by:   Gordy Savers  MD on 11/01/2009   Method used:   Electronically to        [facility]* (retail)       [address]       [address]mailing       [place], [place]  [address]       Ph: [phone]       Fax: [phone]   RxID:   [id number]

## 2010-06-07 LAB — BASIC METABOLIC PANEL
CO2: 24 mEq/L (ref 19–32)
Calcium: 9.9 mg/dL (ref 8.4–10.5)
Creatinine, Ser: 0.92 mg/dL (ref 0.4–1.5)
GFR calc Af Amer: >60 mL/min (ref >60)
GFR calc non Af Amer: >60 mL/min (ref >60)
Glucose, Bld: 100 mg/dL — ABNORMAL HIGH (ref 70–99)
Sodium: 142 mEq/L (ref 135–145)

## 2010-06-07 LAB — DIFFERENTIAL
Basophils Absolute: 0 10*3/uL (ref 0.0–0.1)
Basophils Relative: 0 % (ref 0–1)
Lymphocytes Relative: 13 % (ref 12–46)
Neutro Abs: 8.7 10*3/uL — ABNORMAL HIGH (ref 1.7–7.7)
Neutrophils Relative %: 82 % — ABNORMAL HIGH (ref 43–77)

## 2010-06-07 LAB — CBC
Hemoglobin: 16.3 g/dL (ref 13.0–17.0)
MCHC: 35.8 g/dL (ref 30.0–36.0)
RDW: 13.3 % (ref 11.5–15.5)

## 2010-07-08 NOTE — Op Note (Signed)
[place]. [facility]  Patient:    Ernest Hudson, Ernest Hudson                        MRN: [id number] Attending:  Keturah Barre, M.D. CC:         Gordy Savers, M.D.   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  PROCEDURES: 1. Septal reconstruction. 2. Turbinate reconstruction.  SURGEON:  Keturah Barre, M.D.  ANESTHESIA:  Local with general standby.  DESCRIPTION OF PROCEDURE:  Patient placed in the supine position under the local anesthesia.  He was prepped and draped in the appropriate manner.  A hemitransfixion incision was made on the right side that was carried around the columella to the left, and the quadrilateral cartilage was, the mucoperichondrium was elevated, and a strip of posterior quadrilateral cartilage was taken.  A strip of cartilage was taken from the floor of the nose also, as it was way off the premaxillary crest and into the left side of the nose.  The open and closed Jansen-Middletons were used to take down the ethmoid and vomerine septum.  Once this was taken down, then the 4 mm chisel was used to take down the premaxillary crest.  The vomerine spur was then removed using the open and closed Jansen-Middletons and the [employer] forceps. Once this was corrected, then we were able to bring the rest of the septum back to the midline, which was severely deviated and pushing over into the inferior turbinate on the left.  The inferior turbinate, however, posteriorly, way posteriorly, was still somewhat medial and was lateralized using the butter knife.  On the right side, the inferior turbinate was outfractured also and reduced, but no mucous membrane was removed.  The middle turbinate on the right was also a concha bullosa, and this was reduced but no mucous  membrane was removed.  It was crushed using the polyp forceps.  Once the septum was established back in the midline, then closure was begun using 5-0 plain catgut, then a through-and-through septal suture using 4-0 plain as a blanket suture was used x 2.  Intranasal dressing was then placed.  The patient tolerated the procedure well, is doing well postoperatively.  He is aware of the risks and gains.  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  His packing will be removed tomorrow.  His follow-up will be tomorrow, then Thursday, then in three weeks, six weeks, six months. DD:  07/17/00 TD:  07/17/00 Job: [id number]

## 2010-07-08 NOTE — Op Note (Signed)
NAME:  Ernest Hudson, Ernest Hudson                         ACCOUNT NO.:  [id number]   MEDICAL RECORD NO.:  [id number]                   PATIENT TYPE:  AMB   LOCATION:  DAY                                  FACILITY:  [facility]   PHYSICIAN:  Currie Paris, M.D.           DATE OF BIRTH:  [birth year]   DATE OF PROCEDURE:  04/30/2002  DATE OF DISCHARGE:                                 OPERATIVE REPORT   OFFICE MEDICAL RECORD NUMBER:  [id number]   PREOPERATIVE DIAGNOSIS:  Chronic calculus cholecystitis.   POSTOPERATIVE DIAGNOSIS:  Chronic calculus cholecystitis.   OPERATION:  Laparoscopic cholecystectomy with operative cholangiogram.   SURGEON:  Currie Paris, M.D.   ASSISTANT:  Angelia Mould. Derrell Lolling, M.D.   ANESTHESIA:  General endotracheal.   CLINICAL HISTORY:  The patient is a 55 year old gentleman, who has been  having biliary-type symptoms.  Preoperative evaluation showed some minimal  elevation in his LFTs, so we elected to also do cholangiography.   DESCRIPTION OF PROCEDURE:  The patient seen in the holding area and had no  further questions.  He was taken to the operating room and after  satisfactory general endotracheal anesthesia had been obtained, the abdomen  was shaved, prepped, and draped.  Marcaine 0.25% plain was used for each  incision.  The umbilical incision was made first, the fascia picked up and  opened and under direct vision, the peritoneal cavity entered.  Pursestring  was placed and the Hasson introduced.  The abdomen was insufflated to 15.  There were no gross abnormalities noted.   The patient was placed in reverse Trendelenburg, tilted to the left, and a  10 mm trocar was placed in the epigastrium and two 5's laterally under  direct vision.  The gallbladder was retracted over the liver.  A few  adhesions of the duodenum were taken down just lateral to the gallbladder.  There was a little oozing from where the retraction caused some capsular  bleeding on the  liver, but this stopped readily with some cautery.   The peritoneum over the triangle of Calot was opened.  In cystic artery, its  anterior branch was identified as was the cystic duct.  I could define it  going into the common duct and into the gallbladder.  I placed clips on the  cystic artery and one on the cystic duct near the gallbladder and opened the  cystic duct.  A Cook catheter was introduced and threaded in and operative  cholangiography done which appeared to be normal with good filling of the  common duct and duodenum and then nice filling of the hepatic radicles and a  couple centimeter of cystic duct noted.   Once that was done, I removed the catheter.  I put two clips on the stay  side of the cystic duct and divided it.  Anterior branch of the cystic  artery was divided and the gallbladder removed from below to  above.  Identified the posterior branch coming up and clipped it.  That was also  divided then and as we came a little further up on the gallbladder, noticed  another branch coming right along the bed of the gallbladder which was  clipped.  Once the gallbladder was completely disconnected, we irrigated and  made sure everything was dry and then brought it out the umbilical port.  The abdomen was reinsufflated, a final hemostasis check made, and the  lateral ports then removed.  The umbilical port was removed and the  pursestring tied down and then the abdomen deflated through the epigastric  port.   The skin was closed with 4-0 Monocryl subcuticular plus Steri-Strips.   The patient tolerated the procedure well.  There were no operative  complications, and all counts were correct.                                               Currie Paris, M.D.    CJS/MEDQ  D:  04/30/2002  T:  04/30/2002  Job:  [id number]   cc:   Gordy Savers, M.D. [facility]

## 2010-07-08 NOTE — H&P (Signed)
[place]. [facility]  Patient:    Ernest Hudson, Ernest Hudson                     MRN: [id number] Adm. Date:  07/17/00 Attending:  Keturah Barre, M.D. CC:         Gordy Savers, M.D.   History and Physical  CHIEF COMPLAINT:  This patient is a 55 year old male who has had some difficulties in the past.  He played a great deal of soccer.  He was hit in the nose a great deal, and has lived with a septal deviation for approximately 30 years.  He has had a headache, problems at times, and some nasal congestion.  He does have some allergy problems, and has taken Zyrtec.  He has an obvious septal deviation, and now enters for a septal reconstruction.  CURRENT MEDICATIONS: 1. Metoprolol 50 mg q.d. 2. Hydrochlorothiazide 1/2 of a 25 mg tablet q.d. 3. Zyrtec 10 mg q.d. 4. Vitamin E and vitamin C q.d. 5. Aspirin, which he has been off of for five days.  SOCIAL HISTORY:  He used to smoke three cigars a day.  No longer does this.  PAST MEDICAL HISTORY: 1. A collapsed lung seven years ago. 2. He has had knee surgery. 3. Ear surgery as a child, an exploratory tympanotomy.  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  It was done on the left ear.  He, however, has no    para____ fistula.  Has not had any vertigo.  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  PHYSICAL EXAMINATION:  VITAL SIGNS:  Blood pressure 122/86, heart rate 52.  HEENT:  Ears:  External ear canals are clear.  The tympanic membranes look excellent.  The nose shows a severe septal deviation, turbinate hypertrophy with nasal obstruction.  The oral cavity is clear.  There is no ulceration or mass.  The larynx is clear.  True cords, false cords, epiglottis, and the base of the tongue and free of any ulcerations or mass.  NECK:  Free of any thyromegaly,  cervical adenopathy, or mass.  CHEST:  Clear.  No rales, rhonchi, or wheezes.  CARDIOVASCULAR:  No opening snaps, murmurs, or gallops.  ABDOMEN:  Unremarkable.  EXTREMITIES:  Unremarkable except for his previous knee surgery.  NEUROLOGIC:  Oriented x 3.  Cranial nerves intact.  INITIAL DIAGNOSES: 1. Septal deviation, turbinate hypertrophy, secondary to nasal trauma. 2. 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 3. History of knee surgery. 4. History of a collapsed lung. 5. History of cigar smoking, three a day. DD:  07/17/00 TD:  07/17/00 Job: [id number]

## 2010-09-07 ENCOUNTER — Encounter: Payer: Self-pay | Admitting: Gastroenterology

## 2010-10-27 ENCOUNTER — Other Ambulatory Visit (INDEPENDENT_AMBULATORY_CARE_PROVIDER_SITE_OTHER): Payer: 59

## 2010-10-27 DIAGNOSIS — Z0000 Encounter for general adult medical examination without abnormal findings: Secondary | ICD-10-CM

## 2010-10-27 LAB — CBC WITH DIFFERENTIAL/PLATELET
Basophils Relative: 0.3 % (ref 0.0–3.0)
Eosinophils Absolute: 0.4 10*3/uL (ref 0.0–0.7)
Eosinophils Relative: 6.5 % — ABNORMAL HIGH (ref 0.0–5.0)
HCT: 47.1 % (ref 39.0–52.0)
Hemoglobin: 16 g/dL (ref 13.0–17.0)
MCHC: 34 g/dL (ref 30.0–36.0)
MCV: 91.5 fl (ref 78.0–100.0)
Monocytes Absolute: 0.4 10*3/uL (ref 0.1–1.0)
Neutro Abs: 3.2 10*3/uL (ref 1.4–7.7)
Neutrophils Relative %: 52.1 % (ref 43.0–77.0)
RBC: 5.14 Mil/uL (ref 4.22–5.81)
WBC: 6.1 10*3/uL (ref 4.5–10.5)

## 2010-10-27 LAB — HEPATIC FUNCTION PANEL
ALT: 27 U/L (ref 0–53)
Albumin: 4.2 g/dL (ref 3.5–5.2)
Bilirubin, Direct: 0.1 mg/dL (ref 0.0–0.3)
Total Protein: 6.7 g/dL (ref 6.0–8.3)

## 2010-10-27 LAB — BASIC METABOLIC PANEL
CO2: 28 mEq/L (ref 19–32)
Chloride: 108 mEq/L (ref 96–112)
Creatinine, Ser: 0.8 mg/dL (ref 0.4–1.5)
Potassium: 4.6 mEq/L (ref 3.5–5.1)
Sodium: 144 mEq/L (ref 135–145)

## 2010-10-27 LAB — POCT URINALYSIS DIPSTICK
Bilirubin, UA: NEGATIVE
Blood, UA: NEGATIVE
Glucose, UA: NEGATIVE
Ketones, UA: NEGATIVE
Leukocytes, UA: NEGATIVE
Nitrite, UA: NEGATIVE

## 2010-10-27 LAB — LIPID PANEL
Total CHOL/HDL Ratio: 5
Triglycerides: 96 mg/dL (ref 0.0–149.0)

## 2010-10-27 LAB — TSH: TSH: 3.05 u[IU]/mL (ref 0.35–5.50)

## 2010-11-03 ENCOUNTER — Ambulatory Visit (INDEPENDENT_AMBULATORY_CARE_PROVIDER_SITE_OTHER): Payer: 59 | Admitting: Internal Medicine

## 2010-11-03 ENCOUNTER — Encounter: Payer: Self-pay | Admitting: Internal Medicine

## 2010-11-03 VITALS — BP 130/90 | HR 60 | Temp 98.2°F | Resp 16 | Ht 71.0 in | Wt 205.0 lb

## 2010-11-03 DIAGNOSIS — Z23 Encounter for immunization: Secondary | ICD-10-CM

## 2010-11-03 DIAGNOSIS — I10 Essential (primary) hypertension: Secondary | ICD-10-CM

## 2010-11-03 DIAGNOSIS — Z720 Tobacco use: Secondary | ICD-10-CM | POA: Insufficient documentation

## 2010-11-03 DIAGNOSIS — F17200 Nicotine dependence, unspecified, uncomplicated: Secondary | ICD-10-CM

## 2010-11-03 DIAGNOSIS — Z0000 Encounter for general adult medical examination without abnormal findings: Secondary | ICD-10-CM

## 2010-11-03 DIAGNOSIS — E785 Hyperlipidemia, unspecified: Secondary | ICD-10-CM

## 2010-11-03 MED ORDER — FLUTICASONE PROPIONATE 50 MCG/ACT NA SUSP: 2.0000 | Freq: Every day | NASAL | Status: DC

## 2010-11-03 MED ORDER — LISINOPRIL 40 MG PO TABS: 40.0000 mg | ORAL_TABLET | Freq: Every day | ORAL | Status: DC

## 2010-11-03 NOTE — Progress Notes (Signed)
Subjective:    Patient ID: Ernest Hudson, male    DOB: 06-Mar-1955, 55 y.o.   MRN: [id number]  HPI  55 year old patient who is seen today for an annual exam. Medical problems include hypertension ongoing tobacco abuse and mild dyslipidemia. He is doing quite well. For the past month he has had some early morning stiffness involving both hands only. He does fairly vigorous manual labor. Otherwise done quite well he has allergic rhinitis which has been stable. Laboratory studies were reviewed.     Review of Systems  Constitutional: Negative for fever, chills, activity change, appetite change and fatigue.  HENT: Negative for hearing loss, ear pain, congestion, rhinorrhea, sneezing, mouth sores, trouble swallowing, neck pain, neck stiffness, dental problem, voice change, sinus pressure and tinnitus.   Eyes: Negative for photophobia, pain, redness and visual disturbance.  Respiratory: Negative for apnea, cough, choking, chest tightness, shortness of breath and wheezing.   Cardiovascular: Negative for chest pain, palpitations and leg swelling.  Gastrointestinal: Negative for nausea, vomiting, abdominal pain, diarrhea, constipation, blood in stool, abdominal distention, anal bleeding and rectal pain.  Genitourinary: Negative for dysuria, urgency, frequency, hematuria, flank pain, decreased urine volume, discharge, penile swelling, scrotal swelling, difficulty urinating, genital sores and testicular pain.  Musculoskeletal: Negative for myalgias, back pain, joint swelling, arthralgias and gait problem.  Skin: Negative for color change, rash and wound.  Neurological: Negative for dizziness, tremors, seizures, syncope, facial asymmetry, speech difficulty, weakness, light-headedness, numbness and headaches.  Hematological: Negative for adenopathy. Does not bruise/bleed easily.  Psychiatric/Behavioral: Negative for suicidal ideas, hallucinations, behavioral problems, confusion, sleep disturbance,  self-injury, dysphoric mood, decreased concentration and agitation. The patient is not nervous/anxious.   Preventive Screening-Counseling & Management  Alcohol-Tobacco  Smoking Status: current   Allergies:  1) ! Niacin   Past History:  Past Medical History:  Reviewed history from 12/31/2008 and no changes required.  Allergic rhinitis  Hypertension  Colonic polyps, hx of  Hyperlipidemia  bradycardia  Low HDL cholesterol    Past Surgical History:  Reviewed history from 10/30/2008 and no changes required.  spontaneous pneumothorax 10/05  Cholecystectomy 2004  ear surgery 1971  colonoscopy 5-07  Cardiolyte Stress 3-10   Family History:  Reviewed history from 10/29/2007 and no changes required.  father status post MI, history of diabetes. prostate ca  Mother, status post breast cancer, history of diabetes  two brothers and one sister in good health   Social History:  Reviewed history from 10/29/2007 and no changes required.  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  HENT:  Head: Normocephalic and atraumatic.  Right Ear: External ear normal.  Left Ear: External ear normal.  Nose: Nose normal.  Mouth/Throat: Oropharynx is clear and moist.  Eyes: Conjunctivae and EOM are normal. Pupils are equal, round, and reactive to light. No scleral icterus.  Neck: Normal range of motion. Neck supple. No JVD present. No thyromegaly present.  Cardiovascular: Regular rhythm, normal heart sounds and intact distal pulses.  Exam reveals no gallop and no friction rub.   No murmur heard.      Posterior tibial pulses full. Dorsalis pedis pulses faint  Pulmonary/Chest: Effort normal and breath sounds normal. He exhibits no tenderness.  Abdominal: Soft. Bowel sounds are normal. He exhibits no distension and no mass. There is no tenderness.    Genitourinary: Prostate normal and penis normal.  Musculoskeletal: Normal range of motion. He exhibits no edema and no tenderness.       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  Neurological: He is alert. He has normal reflexes. No cranial nerve deficit. Coordination normal.  Skin: Skin is warm and dry. No rash noted.  Psychiatric: He has a normal mood and affect. His behavior is normal.          Assessment & Plan:   Preventive health examination Hypertension stable. Repeat blood pressure 130/82 Mild dyslipidemia. More vigorous exercise weight loss encouraged heart healthy diet encouraged History colonic polyps. He has been notified by GI. These apparently were hyperplastic and followup colonoscopy in 10 years was recommended Ongoing tobacco use smoking cessation again discussed and encouraged

## 2010-11-03 NOTE — Patient Instructions (Signed)
Please check your blood pressure on a regular basis.  If it is consistently greater than 150/90, please make an office appointment.  Limit your sodium (Salt) intake    It is important that you exercise regularly, at least 20 minutes 3 to 4 times per week.  If you develop chest pain or shortness of breath seek  medical attention.  Smoking tobacco is very bad for your health. You should stop smoking immediately.  Return in 6 months for follow-up

## 2010-11-30 ENCOUNTER — Emergency Department (HOSPITAL_COMMUNITY)
Admission: EM | Admit: 2010-11-30 | Discharge: 2010-11-30 | Disposition: A | Discharge status: Home / Self Care | Payer: 59 | Attending: Emergency Medicine | Admitting: Emergency Medicine

## 2010-11-30 ENCOUNTER — Emergency Department (HOSPITAL_COMMUNITY): Payer: 59

## 2010-11-30 DIAGNOSIS — N289 Disorder of kidney and ureter, unspecified: Secondary | ICD-10-CM | POA: Insufficient documentation

## 2010-11-30 DIAGNOSIS — N201 Calculus of ureter: Secondary | ICD-10-CM | POA: Insufficient documentation

## 2010-11-30 DIAGNOSIS — I10 Essential (primary) hypertension: Secondary | ICD-10-CM | POA: Insufficient documentation

## 2010-11-30 DIAGNOSIS — D3500 Benign neoplasm of unspecified adrenal gland: Secondary | ICD-10-CM | POA: Insufficient documentation

## 2010-11-30 LAB — URINALYSIS, ROUTINE W REFLEX MICROSCOPIC
Glucose, UA: NEGATIVE mg/dL
Leukocytes, UA: NEGATIVE
Protein, ur: NEGATIVE mg/dL
Specific Gravity, Urine: 1.024 (ref 1.005–1.030)
pH: 6 (ref 5.0–8.0)

## 2010-11-30 LAB — DIFFERENTIAL
Basophils Relative: 0 % (ref 0–1)
Eosinophils Absolute: 0.1 10*3/uL (ref 0.0–0.7)
Neutro Abs: 6.3 10*3/uL (ref 1.7–7.7)
Neutrophils Relative %: 79 % — ABNORMAL HIGH (ref 43–77)

## 2010-11-30 LAB — CBC
Platelets: 195 10*3/uL (ref 150–400)
RBC: 4.98 MIL/uL (ref 4.22–5.81)
WBC: 8 10*3/uL (ref 4.0–10.5)

## 2010-11-30 LAB — BASIC METABOLIC PANEL
CO2: 23 mEq/L (ref 19–32)
Chloride: 106 mEq/L (ref 96–112)
Potassium: 3.9 mEq/L (ref 3.5–5.1)
Sodium: 139 mEq/L (ref 135–145)

## 2010-11-30 LAB — URINE MICROSCOPIC-ADD ON

## 2010-12-19 ENCOUNTER — Other Ambulatory Visit (HOSPITAL_COMMUNITY): Payer: Self-pay | Admitting: Adult Health

## 2010-12-19 DIAGNOSIS — D4959 Neoplasm of unspecified behavior of other genitourinary organ: Secondary | ICD-10-CM

## 2010-12-27 ENCOUNTER — Ambulatory Visit (HOSPITAL_COMMUNITY)
Admission: RE | Admit: 2010-12-27 | Discharge: 2010-12-27 | Disposition: A | Discharge status: Home / Self Care | Payer: 59 | Source: Ambulatory Visit | Attending: Adult Health | Admitting: Adult Health

## 2010-12-27 DIAGNOSIS — N289 Disorder of kidney and ureter, unspecified: Secondary | ICD-10-CM | POA: Insufficient documentation

## 2010-12-27 DIAGNOSIS — Z9089 Acquired absence of other organs: Secondary | ICD-10-CM | POA: Insufficient documentation

## 2010-12-27 DIAGNOSIS — D3500 Benign neoplasm of unspecified adrenal gland: Secondary | ICD-10-CM | POA: Insufficient documentation

## 2010-12-27 DIAGNOSIS — D4959 Neoplasm of unspecified behavior of other genitourinary organ: Secondary | ICD-10-CM

## 2010-12-27 DIAGNOSIS — E279 Disorder of adrenal gland, unspecified: Secondary | ICD-10-CM | POA: Insufficient documentation

## 2010-12-27 DIAGNOSIS — Q619 Cystic kidney disease, unspecified: Secondary | ICD-10-CM | POA: Insufficient documentation

## 2010-12-27 MED ORDER — GADOBENATE DIMEGLUMINE 529 MG/ML IV SOLN
20.0000 mL | Freq: Once | INTRAVENOUS | Status: AC | PRN
  Administered 2010-12-27: 19 mL via INTRAVENOUS

## 2011-05-04 ENCOUNTER — Ambulatory Visit (INDEPENDENT_AMBULATORY_CARE_PROVIDER_SITE_OTHER): Payer: 59 | Admitting: Internal Medicine

## 2011-05-04 ENCOUNTER — Encounter: Payer: Self-pay | Admitting: Internal Medicine

## 2011-05-04 VITALS — BP 110/82 | Temp 98.0°F | Wt 199.0 lb

## 2011-05-04 DIAGNOSIS — I10 Essential (primary) hypertension: Secondary | ICD-10-CM

## 2011-05-04 DIAGNOSIS — F17200 Nicotine dependence, unspecified, uncomplicated: Secondary | ICD-10-CM

## 2011-05-04 DIAGNOSIS — Z720 Tobacco use: Secondary | ICD-10-CM

## 2011-05-04 MED ORDER — LISINOPRIL 40 MG PO TABS: 40.0000 mg | ORAL_TABLET | Freq: Every day | ORAL | Status: DC

## 2011-05-04 MED ORDER — FLUTICASONE PROPIONATE 50 MCG/ACT NA SUSP: 2.0000 | Freq: Every day | NASAL | Status: DC

## 2011-05-04 NOTE — Progress Notes (Signed)
  Subjective:    Patient ID: Ernest Hudson, male    DOB: 11-Apr-1955, 56 y.o.   MRN: [id number]  HPI  56 year old patient who is seen today for followup of hypertension. He is doing quite well. He has been followed by urology due to to renal stone disease. He has a followup abdominal ultrasound scheduled to confirm a left renal adenoma. No concerns or complaints today. 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 Asymptomatic except for some mild sinus issues that are chronic    Review of Systems  Constitutional: Negative for fever, chills, appetite change and fatigue.  HENT: Positive for congestion. Negative for hearing loss, ear pain, sore throat, trouble swallowing, neck stiffness, dental problem, voice change and tinnitus.   Eyes: Negative for pain, discharge and visual disturbance.  Respiratory: Negative for cough, chest tightness, wheezing and stridor.   Cardiovascular: Negative for chest pain, palpitations and leg swelling.  Gastrointestinal: Negative for nausea, vomiting, abdominal pain, diarrhea, constipation, blood in stool and abdominal distention.  Genitourinary: Negative for urgency, hematuria, flank pain, discharge, difficulty urinating and genital sores.  Musculoskeletal: Negative for myalgias, back pain, joint swelling, arthralgias and gait problem.  Skin: Negative for rash.  Neurological: Negative for dizziness, syncope, speech difficulty, weakness, numbness and headaches.  Hematological: Negative for adenopathy. Does not bruise/bleed easily.  Psychiatric/Behavioral: Negative for behavioral problems and dysphoric mood. The patient is not nervous/anxious.        Objective:   Physical Exam  Constitutional: He is oriented to person, place, and time. He appears well-developed.  HENT:  Head: Normocephalic.  Right Ear: External ear normal.  Left Ear: External ear normal.  Eyes: Conjunctivae and EOM are normal.  Neck: Normal range of motion.    Cardiovascular: Normal rate and normal heart sounds.   Pulmonary/Chest: Breath sounds normal.  Abdominal: Bowel sounds are normal.  Musculoskeletal: Normal range of motion. He exhibits no edema and no tenderness.  Neurological: He is alert and oriented to person, place, and time.  Psychiatric: He has a normal mood and affect. His behavior is normal.          Assessment & Plan:   Hypertension stable we'll continue present regimen Allergic rhinitis History tobacco use  Smoking cessation encouraged CPX in one year

## 2011-05-04 NOTE — Patient Instructions (Signed)
Limit your sodium (Salt) intake    It is important that you exercise regularly, at least 20 minutes 3 to 4 times per week.  If you develop chest pain or shortness of breath seek  medical attention.  Please check your blood pressure on a regular basis.  If it is consistently greater than 150/90, please make an office appointment.  

## 2011-07-06 ENCOUNTER — Encounter: Payer: Self-pay | Admitting: Internal Medicine

## 2011-07-06 ENCOUNTER — Ambulatory Visit (INDEPENDENT_AMBULATORY_CARE_PROVIDER_SITE_OTHER): Payer: 59 | Admitting: Internal Medicine

## 2011-07-06 VITALS — BP 130/80 | Temp 97.9°F | Wt 198.0 lb

## 2011-07-06 DIAGNOSIS — J309 Allergic rhinitis, unspecified: Secondary | ICD-10-CM

## 2011-07-06 DIAGNOSIS — J019 Acute sinusitis, unspecified: Secondary | ICD-10-CM

## 2011-07-06 DIAGNOSIS — Z720 Tobacco use: Secondary | ICD-10-CM

## 2011-07-06 DIAGNOSIS — F17200 Nicotine dependence, unspecified, uncomplicated: Secondary | ICD-10-CM

## 2011-07-06 MED ORDER — BUPROPION HCL ER (SR) 150 MG PO TB12: 150.0000 mg | ORAL_TABLET | Freq: Two times a day (BID) | ORAL | Status: DC

## 2011-07-06 MED ORDER — AMOXICILLIN 500 MG PO CAPS: 500.0000 mg | ORAL_CAPSULE | Freq: Three times a day (TID) | ORAL | Status: AC

## 2011-07-06 NOTE — Patient Instructions (Signed)
Mucinex one tablet twice daily   Use saline irrigation, warm  moist compresses and over-the-counter decongestants only as directed.  Call if there is no improvement in 5 to 7 days, or sooner if you develop increasing pain, fever, or any new symptoms.  Take your antibiotic as prescribed until ALL of it is gone, but stop if you develop a rash, swelling, or any side effects of the medication.  Contact our office as soon as possible if  there are side effects of the medication.  Smoking tobacco is very bad for your health. You should stop smoking immediately.

## 2011-07-06 NOTE — Progress Notes (Signed)
  Subjective:    Patient ID: Ernest Hudson, male    DOB: [birth year], 56 y.o.   MRN: [id number]  HPI  56 year old patient who has a history of ongoing tobacco use as well as allergic rhinitis. One week ago he had the onset of the sore throat fever. Of past week he has had worsening head congestion cough and headache he has developed sinus pain and purulent sinus drainage and current productive cough there's been some chest congestion and occasional wheezing. He has treated hypertension. He continues to smoke and is requesting assistance with a smoking cessation program    Review of Systems  Constitutional: Negative for fever, chills, appetite change and fatigue.  HENT: Positive for congestion, sore throat, postnasal drip and sinus pressure. Negative for hearing loss, ear pain, trouble swallowing, neck stiffness, dental problem, voice change and tinnitus.   Eyes: Negative for pain, discharge and visual disturbance.  Respiratory: Positive for cough. Negative for chest tightness, wheezing and stridor.   Cardiovascular: Negative for chest pain, palpitations and leg swelling.  Gastrointestinal: Negative for nausea, vomiting, abdominal pain, diarrhea, constipation, blood in stool and abdominal distention.  Genitourinary: Negative for urgency, hematuria, flank pain, discharge, difficulty urinating and genital sores.  Musculoskeletal: Negative for myalgias, back pain, joint swelling, arthralgias and gait problem.  Skin: Negative for rash.  Neurological: Negative for dizziness, syncope, speech difficulty, weakness, numbness and headaches.  Hematological: Negative for adenopathy. Does not bruise/bleed easily.  Psychiatric/Behavioral: Negative for behavioral problems and dysphoric mood. The patient is not nervous/anxious.        Objective:   Physical Exam  Constitutional: He is oriented to person, place, and time. He appears well-developed.  HENT:  Head: Normocephalic.  Right Ear: External ear  normal.  Left Ear: External ear normal.  Eyes: Conjunctivae and EOM are normal.  Neck: Normal range of motion.  Cardiovascular: Normal rate and normal heart sounds.   Pulmonary/Chest: Effort normal and breath sounds normal. No respiratory distress. He has no wheezes. He has no rales.       Breath sounds slightly diminished left base  Abdominal: Bowel sounds are normal.  Musculoskeletal: Normal range of motion. He exhibits no edema and no tenderness.  Neurological: He is alert and oriented to person, place, and time.  Psychiatric: He has a normal mood and affect. His behavior is normal.          Assessment & Plan:   Bronchitis sinusitis. We'll treat with amoxicillin for 10 days. Smoking cessation encouraged. Will treat with expectorants and sinus irrigation. We'll continue nasal steroids Tobacco abuse. Will give a trial of Wellbutrin Hypertension stable

## 2011-08-21 ENCOUNTER — Other Ambulatory Visit: Payer: Self-pay | Admitting: Dermatology

## 2011-08-23 ENCOUNTER — Telehealth: Payer: Self-pay | Admitting: *Deleted

## 2011-08-23 NOTE — Telephone Encounter (Signed)
Confirmed 10/09/11 genetic appt w/ Dois Davenport at referring facility.  She is to give pt appt info and my number.

## 2011-10-03 ENCOUNTER — Telehealth: Payer: Self-pay | Admitting: Internal Medicine

## 2011-10-03 ENCOUNTER — Ambulatory Visit (INDEPENDENT_AMBULATORY_CARE_PROVIDER_SITE_OTHER): Payer: 59 | Admitting: Internal Medicine

## 2011-10-03 ENCOUNTER — Encounter: Payer: Self-pay | Admitting: Internal Medicine

## 2011-10-03 VITALS — BP 162/98 | Temp 98.6°F | Wt 197.0 lb

## 2011-10-03 DIAGNOSIS — R209 Unspecified disturbances of skin sensation: Secondary | ICD-10-CM

## 2011-10-03 DIAGNOSIS — I10 Essential (primary) hypertension: Secondary | ICD-10-CM

## 2011-10-03 DIAGNOSIS — M79601 Pain in right arm: Secondary | ICD-10-CM | POA: Insufficient documentation

## 2011-10-03 DIAGNOSIS — R200 Anesthesia of skin: Secondary | ICD-10-CM

## 2011-10-03 DIAGNOSIS — M79609 Pain in unspecified limb: Secondary | ICD-10-CM

## 2011-10-03 MED ORDER — AMLODIPINE BESYLATE 5 MG PO TABS: 5.0000 mg | ORAL_TABLET | Freq: Every day | ORAL | Status: DC

## 2011-10-03 MED ORDER — METHOCARBAMOL 500 MG PO TABS: 500.0000 mg | ORAL_TABLET | Freq: Three times a day (TID) | ORAL | Status: AC | PRN

## 2011-10-03 NOTE — Assessment & Plan Note (Signed)
Blood pressure is suboptimally controlled. Add amlodipine 5 mg. Electrolytes and kidney function before next followup visit. BP: 162/98 mmHg  Lab Results  Component Value Date   CREATININE 1.22 11/30/2010

## 2011-10-03 NOTE — Telephone Encounter (Signed)
Caller: [name]/Patient; Patient Name: Ernest Hudson; PCP: Eleonore Chiquito; Best Callback Phone Number: [phone].  Patient calling about R neck and arm pain.  Mild numbness to fingertips.  Onset 10/02/11.  On arising AM 10/03/11 he states his arm is still mildly numb and tingly.  Able to use/move the arm.  c/o neck pain "up to the ear on the right."  Blood pressure 149/85 1000  10/03/11.  Per "Neck Pain" and "Numbness and Tingling" protocols, emergent symptoms denied; advised appointment within 72 hours.  Appointment scheduled 10/03/11 1545 with Dr. Artist Pais, as Dr. Amador Cunas out of office per Epic.

## 2011-10-03 NOTE — Progress Notes (Signed)
Subjective:    Patient ID: Ernest Hudson, male    DOB: 1955-12-18, 56 y.o.   MRN: [id number]  HPI  56 year old white male with history of hypertension complains of tightness in his right neck and right arm heaviness. Symptoms started 24-48 hours ago. He denies any trauma or injury. 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 He usually carries on his left shoulder. Severity of discomfort is rated as 3/10. His pain is not worse with neck movement.  Patient denies shortness of breath or chest pain.  Htn - he does not monitor at home.  He is smoker.    Review of Systems Negative for arm weakness, numbness and tingling in right arm  Past Medical History  Diagnosis Date  . Allergic rhinitis   . Hypertension   . Hx of colonic polyps   . Hyperlipidemia   . Bradycardia   . Dyslipidemia (high LDL; low HDL)     History   Social History  . Marital Status: Married    Spouse Name: N/A    Number of Children: N/A  . Years of Education: N/A   Occupational History  . Not on file.   Social History Main Topics  . Smoking status: Current Everyday Smoker -- 1.0 packs/day    Types: Cigarettes  . Smokeless tobacco: Never Used  . Alcohol Use: No  . Drug Use: No  . Sexually Active: Not on file   Other Topics Concern  . Not on file   Social History Narrative  . No narrative on file    Past Surgical History  Procedure Date  . Sponstaneous pneumothorax 10/05  . Cholecystectomy 2004  . External ear surgery 1971  . Colonoscopy 5-07  . Cardiolyte stress 3/10    Family History  Problem Relation Age of Onset  . Cancer Mother     breast  . Diabetes Mother   . Heart attack Father   . Diabetes Father   . Cancer Father     prostate   . Healthy Sister   . Healthy Brother   . Healthy Brother     Allergies  Allergen Reactions  . Niacin     REACTION: rash    Current Outpatient Prescriptions on File Prior to Visit  Medication Sig Dispense Refill  . aspirin 81 MG  tablet Take 81 mg by mouth daily.        Marland Kitchen buPROPion (WELLBUTRIN SR) 150 MG 12 hr tablet Take 1 tablet (150 mg total) by mouth 2 (two) times daily.  60 tablet  2  . cetirizine (ZYRTEC) 10 MG tablet Take 10 mg by mouth daily.        . fluticasone (FLONASE) 50 MCG/ACT nasal spray Place 2 sprays into the nose daily.  16 g  6  . lisinopril (PRINIVIL,ZESTRIL) 40 MG tablet Take 1 tablet (40 mg total) by mouth daily.  90 tablet  6  . amLODipine (NORVASC) 5 MG tablet Take 1 tablet (5 mg total) by mouth daily.  30 tablet  2    BP 162/98  Temp 98.6 F (37 C) (Oral)  Wt 197 lb (89.359 kg)  EKG shows sinus bradycardia at 52 beats per minute. Voltage criteria for LVH.     Objective:   Physical Exam  Constitutional: He appears well-developed and well-nourished.  HENT:  Head: Normocephalic and atraumatic.  Cardiovascular: Normal rate, regular rhythm and normal heart sounds.   No murmur heard. Pulmonary/Chest: Effort normal and breath sounds normal. He has no  wheezes. He has no rales.  Musculoskeletal:       Muscle strength is 5 out of 5 in upper extremities Cervical range of motion is normal Reflexes upper extremities are normal and symmetric  Neurological: No cranial nerve deficit.  Skin: Skin is warm and dry.  Psychiatric: He has a normal mood and affect. His behavior is normal.          Assessment & Plan:

## 2011-10-03 NOTE — Patient Instructions (Addendum)
Please complete the following lab tests before your next follow up appointment: BMET - 401.9 Lipid panel, LFTs - 272.4 

## 2011-10-03 NOTE — Assessment & Plan Note (Addendum)
56 year old white male with history of tobacco use and hypertension complains of right arm heaviness. His blood pressure is elevated today. I doubt his symptoms are anginal equivalent. EKG notable for LVH only.  I suspect his symptoms are secondary to mild cervical disc disease. Patient advised to avoid heavy lifting for [redacted] weeks along with massage therapy. Use muscle relaxers as directed. Followup with primary care physician within 2 weeks.  Patient advised to call office if symptoms persist or worsen.

## 2011-10-09 ENCOUNTER — Ambulatory Visit: Payer: 59 | Admitting: Genetic Counselor

## 2011-10-09 ENCOUNTER — Encounter: Payer: Self-pay | Admitting: Genetic Counselor

## 2011-10-09 ENCOUNTER — Other Ambulatory Visit: Payer: 59

## 2011-10-09 DIAGNOSIS — J939 Pneumothorax, unspecified: Secondary | ICD-10-CM

## 2011-10-09 DIAGNOSIS — N281 Cyst of kidney, acquired: Secondary | ICD-10-CM

## 2011-10-09 NOTE — Progress Notes (Signed)
Dr. Campbell Stall requested a consultation for genetic counseling and risk assessment for Ernest Hudson, a 56 y.o. male, for discussion of his history of pneumothorax, lung blebs on CT, renal cysts, and dermatological findings of trichodiscomas. He presents to clinic today, with his wife Ernest Hudson, to discuss the possibility of a genetic predisposition to cancer, and to further clarify his risks, as well as his family members' risks for cancer.   HISTORY OF PRESENT ILLNESS: Ernest Hudson is a 56 y.o. male with no personal history of cancer.    Past Medical History  Diagnosis Date  . Allergic rhinitis   . Hypertension   . Hx of colonic polyps   . Hyperlipidemia   . Bradycardia   . Dyslipidemia (high LDL; low HDL)   . Pneumothorax 1995    Past Surgical History  Procedure Date  . Sponstaneous pneumothorax 10/05  . Cholecystectomy 2004  . External ear surgery 1971  . Colonoscopy 5-07  . Cardiolyte stress 3/10    History  Substance Use Topics  . Smoking status: Current Everyday Smoker -- 1.0 packs/day    Types: Cigarettes  . Smokeless tobacco: Never Used  . Alcohol Use: No    PERSONAL RISK ASSESSMENT FACTORS: Two lesions biopsied consistent with trichodiscoma Renal MRI and CT in 2012 finding a benign right renal cysts and benign left adrenal adenoma. Pneumothorax in 1995. Bleb's found on chest CT as incidental finding to other workup. Colon polyps found on colonoscopy  FAMILY HISTORY:  We obtained a detailed, 4-generation family history.  Significant diagnoses are listed below: Family History  Problem Relation Age of Onset  . Cancer Mother     breast  . Diabetes Mother   . Heart attack Father   . Diabetes Father   . Cancer Father     prostate   . Healthy Sister   . Lung disease Sister     has blebs on MRI,   . Healthy Brother   . Healthy Brother   . Breast cancer Maternal Aunt     diagnosed in her 95s  . Melanoma Maternal Uncle   . Breast cancer Maternal Aunt       diagnosed in her 68s  . Breast cancer Maternal Aunt     diagnosed in her 62s  . Cancer Cousin     multiple myeloma  The patient has a history of pneumothorax, blebs on CT, renal cysts, and trichodiscoma. He has two brothers and one sister.  His sister has a history of dermatological findings and Blebs found incidentally on chest CT.  The patient is not aware that his brothers or daughter has any findings consistent with Birt-Hogg-Dube.  He feels that his mother may have had dermatological findings.  He has three maternal aunts with breast cancer, and uncle who died of melanoma and two maternal uncles who are healthy.  He has a maternal cousin with multiple myeloma.  His father was diagnosed with prostate cancer ate age 89.  There is no other reported cancer history, including renal cell carcinoma.  Patient's maternal ancestors are of [place] descent, and paternal ancestors are of [place] descent. There is no reported Ashkenazi Jewish ancestry. There is no known consanguinity.  GENETIC COUNSELING RISK ASSESSMENT, DISCUSSION, AND SUGGESTED FOLLOW UP: We reviewed the natural history and genetic etiology of sporadic, familial and hereditary cancer syndromes. We discussed the signs and symptoms of BHD syndrome, and the dominant inheritance pattern. Based on his current symptoms it is suggestive that he is  affected, although genetic testing would help with diagnosis. We reviewed genetic testing and that it identifies approximately 84% of individuals who have a clinical diagnosis of BHD.  He was provided information about the national support group, and had brought information in about a national study at the [facility] for BHD.  I told him that I would contact this study to find out more about their obligations if they decide to participate.  We also discussed that BHD has a differential diagnosis of Tuberous Sclerosis and MEN disease.  If he tests negative for BHD, it may be helpful to be seen by a geneticist to  determine whether he may have something else.  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 If a mutation indicating Birt Ernest Hudson is detected in this case, the [employer] recommendations would include annual Renal MRI's. If a mutation is detected, the patient will be referred back to the referring provider and to any additional appropriate care providers to discuss the relevant options.   If a mutation is not found in the patient, this will decrease the likelihood of Birt Ernest Hudson as the explanation for his personal history. Cancer surveillance options would be discussed for the patient according to the appropriate standard National Comprehensive Cancer Network and American Cancer Society guidelines, with consideration of their personal and family history risk factors. In this case, the patient will be referred back to their care providers for discussions of management.   After considering the risks, benefits, and limitations, the patient provided informed consent for  the following  testing: FLCN through [facility].   Per the patient's request, we will contact him by telephone to discuss these results. A follow up genetic counseling visit will be scheduled if indicated.  The patient was seen for a total of 90 minutes, greater than 50% of which was spent face-to-face counseling.  This plan is being carried out per Dr. Thayer Ohm recommendations.  This note will also be sent to the referring provider via the electronic medical record. The patient will be supplied with a summary of this genetic counseling discussion as well as educational information on the discussed hereditary cancer syndromes following the conclusion of their visit.   Patient was discussed with Dr. Drue Second.  CC BY [place]  MAIL: Dr. Campbell Stall   EDUCATIONAL INFORMATION SUPPLIED TO PATIENT AT ENCOUNTER:  Ernest Hudson national support group information   _______________________________________________________________________ For Office Staff:  Number of people involved in session: 3 Was an Intern/ student involved with case: not applicable

## 2011-10-10 ENCOUNTER — Other Ambulatory Visit (INDEPENDENT_AMBULATORY_CARE_PROVIDER_SITE_OTHER): Payer: 59

## 2011-10-10 DIAGNOSIS — E785 Hyperlipidemia, unspecified: Secondary | ICD-10-CM

## 2011-10-10 DIAGNOSIS — I10 Essential (primary) hypertension: Secondary | ICD-10-CM

## 2011-10-10 LAB — BASIC METABOLIC PANEL
Calcium: 9.5 mg/dL (ref 8.4–10.5)
Chloride: 104 mEq/L (ref 96–112)
Creatinine, Ser: 1 mg/dL (ref 0.4–1.5)

## 2011-10-10 LAB — HEPATIC FUNCTION PANEL
AST: 20 U/L (ref 0–37)
Albumin: 4.2 g/dL (ref 3.5–5.2)
Alkaline Phosphatase: 73 U/L (ref 39–117)
Total Protein: 7.2 g/dL (ref 6.0–8.3)

## 2011-10-10 LAB — LIPID PANEL
Cholesterol: 162 mg/dL (ref 0–200)
Triglycerides: 94 mg/dL (ref 0.0–149.0)

## 2011-10-17 ENCOUNTER — Ambulatory Visit (INDEPENDENT_AMBULATORY_CARE_PROVIDER_SITE_OTHER): Payer: 59 | Admitting: Internal Medicine

## 2011-10-17 ENCOUNTER — Encounter: Payer: Self-pay | Admitting: Internal Medicine

## 2011-10-17 VITALS — BP 142/94 | Temp 97.9°F | Wt 200.0 lb

## 2011-10-17 DIAGNOSIS — F17200 Nicotine dependence, unspecified, uncomplicated: Secondary | ICD-10-CM

## 2011-10-17 DIAGNOSIS — I10 Essential (primary) hypertension: Secondary | ICD-10-CM

## 2011-10-17 DIAGNOSIS — Z720 Tobacco use: Secondary | ICD-10-CM

## 2011-10-17 MED ORDER — AMLODIPINE BESYLATE 10 MG PO TABS: 10.0000 mg | ORAL_TABLET | Freq: Every day | ORAL | Status: DC

## 2011-10-17 NOTE — Patient Instructions (Signed)
Limit your sodium (Salt) intake  Please check your blood pressure on a regular basis.  If it is consistently greater than 150/90, please make an office appointment.  Smoking tobacco is very bad for your health. You should stop smoking immediately. 

## 2011-10-17 NOTE — Progress Notes (Signed)
Subjective:    Patient ID: Ernest Hudson, male    DOB: [birth year], 56 y.o.   MRN: [id number]  HPI  56 year old patient who is seen today for followup of his hypertension. Amlodipine added to his regimen approximately 2 weeks ago. Blood pressure readings at home have been quite labile but generally high. He feels well on this medication. His right arm tingling and numbness has resolved. This resolved within 12 hours of his last visit. No new concerns or complaints. He awaits results of genetic testing  Past Medical History  Diagnosis Date  . Allergic rhinitis   . Hypertension   . Hx of colonic polyps   . Hyperlipidemia   . Bradycardia   . Dyslipidemia (high LDL; low HDL)   . Pneumothorax 1995    History   Social History  . Marital Status: Married    Spouse Name: N/A    Number of Children: N/A  . Years of Education: N/A   Occupational History  . Not on file.   Social History Main Topics  . Smoking status: Current Everyday Smoker -- 1.0 packs/day    Types: Cigarettes  . Smokeless tobacco: Never Used  . Alcohol Use: No  . Drug Use: No  . Sexually Active: Not on file   Other Topics Concern  . Not on file   Social History Narrative  . No narrative on file    Past Surgical History  Procedure Date  . Sponstaneous pneumothorax 10/05  . Cholecystectomy 2004  . External ear surgery 1971  . Colonoscopy 5-07  . Cardiolyte stress 3/10    Family History  Problem Relation Age of Onset  . Cancer Mother     breast  . Diabetes Mother   . Heart attack Father   . Diabetes Father   . Cancer Father     prostate   . Healthy Sister   . Lung disease Sister     has blebs on MRI,   . Healthy Brother   . Healthy Brother   . Breast cancer Maternal Aunt     diagnosed in her 43s  . Melanoma Maternal Uncle   . Breast cancer Maternal Aunt     diagnosed in her 32s  . Breast cancer Maternal Aunt     diagnosed in her 67s  . Cancer Cousin     multiple myeloma    Allergies    Allergen Reactions  . Niacin     REACTION: rash    Current Outpatient Prescriptions on File Prior to Visit  Medication Sig Dispense Refill  . amLODipine (NORVASC) 5 MG tablet Take 1 tablet (5 mg total) by mouth daily.  30 tablet  2  . aspirin 81 MG tablet Take 81 mg by mouth daily.        Marland Kitchen buPROPion (WELLBUTRIN SR) 150 MG 12 hr tablet Take 1 tablet (150 mg total) by mouth 2 (two) times daily.  60 tablet  2  . cetirizine (ZYRTEC) 10 MG tablet Take 10 mg by mouth daily.        . fluticasone (FLONASE) 50 MCG/ACT nasal spray Place 2 sprays into the nose daily.  16 g  6  . lisinopril (PRINIVIL,ZESTRIL) 40 MG tablet Take 1 tablet (40 mg total) by mouth daily.  90 tablet  6    BP 142/94  Temp 97.9 F (36.6 C) (Oral)  Wt 200 lb (90.719 kg)       Review of Systems  Constitutional: Negative.  Objective:   Physical Exam  Constitutional: He appears well-developed and well-nourished. No distress.       Blood pressure 160/100          Assessment & Plan:   Hypertension. Suboptimal control. We'll increase amlodipine to 10 mg daily. Smoking cessation encouraged we'll recheck in 4 weeks

## 2011-11-09 ENCOUNTER — Encounter (HOSPITAL_COMMUNITY): Payer: Self-pay | Admitting: Emergency Medicine

## 2011-11-09 ENCOUNTER — Emergency Department (HOSPITAL_COMMUNITY): Payer: 59

## 2011-11-09 ENCOUNTER — Emergency Department (HOSPITAL_COMMUNITY)
Admission: EM | Admit: 2011-11-09 | Discharge: 2011-11-09 | Disposition: A | Discharge status: Home / Self Care | Payer: 59 | Attending: Emergency Medicine | Admitting: Emergency Medicine

## 2011-11-09 DIAGNOSIS — I10 Essential (primary) hypertension: Secondary | ICD-10-CM | POA: Insufficient documentation

## 2011-11-09 DIAGNOSIS — S61209A Unspecified open wound of unspecified finger without damage to nail, initial encounter: Secondary | ICD-10-CM | POA: Insufficient documentation

## 2011-11-09 DIAGNOSIS — E785 Hyperlipidemia, unspecified: Secondary | ICD-10-CM | POA: Insufficient documentation

## 2011-11-09 DIAGNOSIS — S61412A Laceration without foreign body of left hand, initial encounter: Secondary | ICD-10-CM

## 2011-11-09 DIAGNOSIS — Z79899 Other long term (current) drug therapy: Secondary | ICD-10-CM | POA: Insufficient documentation

## 2011-11-09 DIAGNOSIS — W268XXA Contact with other sharp object(s), not elsewhere classified, initial encounter: Secondary | ICD-10-CM | POA: Insufficient documentation

## 2011-11-09 MED ORDER — OXYCODONE-ACETAMINOPHEN 5-325 MG PO TABS: 2.0000 | ORAL_TABLET | ORAL | Status: DC | PRN

## 2011-11-09 MED ORDER — SULFAMETHOXAZOLE-TRIMETHOPRIM 800-160 MG PO TABS: 1.0000 | ORAL_TABLET | Freq: Two times a day (BID) | ORAL | Status: DC

## 2011-11-09 MED ORDER — TETANUS-DIPHTH-ACELL PERTUSSIS 5-2.5-18.5 LF-MCG/0.5 IM SUSP
0.5000 mL | Freq: Once | INTRAMUSCULAR | Status: AC
  Administered 2011-11-09: 0.5 mL via INTRAMUSCULAR
  Filled 2011-11-09: qty 0.5

## 2011-11-09 MED ORDER — OXYCODONE-ACETAMINOPHEN 5-325 MG PO TABS
2.0000 | ORAL_TABLET | Freq: Once | ORAL | Status: AC
  Administered 2011-11-09: 2 via ORAL
  Filled 2011-11-09: qty 2

## 2011-11-09 NOTE — ED Provider Notes (Signed)
History     CSN: [id number]  Arrival date & time 11/09/11  1438   First MD Initiated Contact with Patient 11/09/11 1552      Chief Complaint  Patient presents with  . Extremity Laceration    1cm laceration at base of 4th finger     (Consider location/radiation/quality/duration/timing/severity/associated sxs/prior treatment) HPI Comments: Patient presents with left hand pain that started today when he cut his left hand with a ratchet while working. He reports immediate onset of bleeding and throbbing pain that does not radiate. Pain is made worse with movement of fingers. He did not try anything for pain. Denies numbness/tingling, weakness.    Past Medical History  Diagnosis Date  . Allergic rhinitis   . Hypertension   . Hx of colonic polyps   . Hyperlipidemia   . Bradycardia   . Dyslipidemia (high LDL; low HDL)   . Pneumothorax 1995  . Cancer     Past Surgical History  Procedure Date  . Sponstaneous pneumothorax 10/05  . Cholecystectomy 2004  . External ear surgery 1971  . Colonoscopy 5-07  . Cardiolyte stress 3/10    Family History  Problem Relation Age of Onset  . Cancer Mother     breast  . Diabetes Mother   . Heart attack Father   . Diabetes Father   . Cancer Father     prostate   . Healthy Sister   . Lung disease Sister     has blebs on MRI,   . Healthy Brother   . Healthy Brother   . Breast cancer Maternal Aunt     diagnosed in her 10s  . Melanoma Maternal Uncle   . Breast cancer Maternal Aunt     diagnosed in her 68s  . Breast cancer Maternal Aunt     diagnosed in her 39s  . Cancer Cousin     multiple myeloma    History  Substance Use Topics  . Smoking status: Current Every Day Smoker -- 1.0 packs/day    Types: Cigarettes  . Smokeless tobacco: Never Used  . Alcohol Use: No      Review of Systems  Musculoskeletal: Positive for arthralgias.  Skin: Positive for wound.  All other systems reviewed and are negative.    Allergies    Niacin  Home Medications   Current Outpatient Rx  Name Route Sig Dispense Refill  . AMLODIPINE BESYLATE 10 MG PO TABS Oral Take 1 tablet (10 mg total) by mouth daily. 90 tablet 3  . ASPIRIN EC 81 MG PO TBEC Oral Take 81 mg by mouth daily.    Marland Kitchen CETIRIZINE HCL 10 MG PO TABS Oral Take 10 mg by mouth daily.      Marland Kitchen FLUTICASONE PROPIONATE 50 MCG/ACT NA SUSP Nasal Place 2 sprays into the nose daily as needed. For allergies.    Marland Kitchen LISINOPRIL 40 MG PO TABS Oral Take 1 tablet (40 mg total) by mouth daily. 90 tablet 6  . VITAMIN B-12 1000 MCG PO TABS Oral Take 1,000 mcg by mouth daily.      BP 110/70  Pulse 67  Temp 98.1 F (36.7 C) (Oral)  Resp 18  Wt 195 lb (88.451 kg)  SpO2 97%  Physical Exam  Nursing note and vitals reviewed. Constitutional: He is oriented to person, place, and time. He appears well-developed and well-nourished. No distress.  HENT:  Head: Normocephalic and atraumatic.  Eyes: Conjunctivae normal and EOM are normal. Pupils are equal, round, and reactive to light.  Neck: Normal range of motion. Neck supple.  Cardiovascular: Normal rate and regular rhythm.  Exam reveals no gallop and no friction rub.   No murmur heard. Pulmonary/Chest: Effort normal and breath sounds normal. He has no wheezes. He has no rales. He exhibits no tenderness.  Abdominal: Soft. There is no tenderness.  Musculoskeletal: Normal range of motion.       Left hand digits limited ROM due to pain.   Neurological: He is alert and oriented to person, place, and time. Coordination normal.       Left hand grip strength diminished due to pain. Sensation equal and intact bilaterally. Speech is goal-oriented. Moves limbs without ataxia.   Skin: Skin is warm and dry.  Psychiatric: He has a normal mood and affect. His behavior is normal.    ED Course  Procedures (including critical care time)  LACERATION REPAIR Performed by: Emilia Beck Authorized by: Emilia Beck Consent: Verbal consent  obtained. Risks and benefits: risks, benefits and alternatives were discussed Consent given by: patient Patient identity confirmed: provided demographic data Prepped and Draped in normal sterile fashion Wound explored  Laceration Location: dorsal left hand, laceration at base of 4th finger and base of thumb  Laceration Length: 1cm and 2cm respectively  No Foreign Bodies seen or palpated  Anesthesia: local infiltration  Local anesthetic: lidocaine 2% without epinephrine  Anesthetic total: 5 ml  Irrigation method: syringe Amount of cleaning: standard  Skin closure: 4-0 prolene  Number of sutures: 8  Technique: simple  Patient tolerance: Patient tolerated the procedure well with no immediate complications.   Labs Reviewed - No data to display Dg Hand Complete Left  11/09/2011  *RADIOLOGY REPORT*  Clinical Data: Finger laceration  LEFT HAND - COMPLETE 3+ VIEW  Comparison: None.  Findings: Three views of the left hand submitted.  No acute fracture or subluxation.  No radiopaque foreign body.  Degenerative changes noted distal interphalangeal joints second third fourth and fifth finger.  IMPRESSION: No acute fracture or subluxation.  Degenerative changes distal interphalangeal joints.   Original Report Authenticated By: Natasha Mead, M.D.      1. Laceration of left hand       MDM  4:21 PM Patient's left hand xray shows no foreign body or fracture. I will suture the lacerations, give him tdap booster, and send him home with pain medication and bactrim.         Emilia Beck, PA-C 11/21/11 2228

## 2011-11-09 NOTE — ED Notes (Addendum)
2 Deep lacerations on l/hand due to piece of machinery 1cm laceration on anterior hand  at base of 4th finger and thumb 2 cm laceration at base of 3 finger on hand.  Bleeding controlled

## 2011-11-14 ENCOUNTER — Encounter: Payer: Self-pay | Admitting: Internal Medicine

## 2011-11-14 ENCOUNTER — Ambulatory Visit (INDEPENDENT_AMBULATORY_CARE_PROVIDER_SITE_OTHER): Payer: 59 | Admitting: Internal Medicine

## 2011-11-14 VITALS — BP 140/80 | Temp 98.0°F | Wt 204.0 lb

## 2011-11-14 DIAGNOSIS — Z23 Encounter for immunization: Secondary | ICD-10-CM

## 2011-11-14 NOTE — Patient Instructions (Signed)
Limit your sodium (Salt) intake    It is important that you exercise regularly, at least 20 minutes 3 to 4 times per week.  If you develop chest pain or shortness of breath seek  medical attention.  Smoking tobacco is very bad for your health. You should stop smoking immediately.  Return in 6 months for follow-up  

## 2011-11-14 NOTE — Progress Notes (Signed)
  Subjective:    Patient ID: Ernest Hudson, male    DOB: 1956/01/23, 56 y.o.   MRN: [id number]  HPI  BP Readings from Last 3 Encounters:  11/14/11 140/80  11/09/11 103/66  10/17/11 142/94    Review of Systems     Objective:   Physical Exam        Assessment & Plan:

## 2011-11-22 NOTE — ED Provider Notes (Signed)
Medical screening examination/treatment/procedure(s) were performed by non-physician practitioner and as supervising physician I was immediately available for consultation/collaboration.   Aleksandr Pellow, MD 11/22/11 [id number] 

## 2011-12-08 ENCOUNTER — Telehealth: Payer: Self-pay | Admitting: Genetic Counselor

## 2011-12-08 NOTE — Telephone Encounter (Signed)
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

## 2011-12-20 ENCOUNTER — Ambulatory Visit (HOSPITAL_BASED_OUTPATIENT_CLINIC_OR_DEPARTMENT_OTHER): Payer: 59 | Admitting: Genetic Counselor

## 2011-12-20 DIAGNOSIS — Z8709 Personal history of other diseases of the respiratory system: Secondary | ICD-10-CM

## 2011-12-20 DIAGNOSIS — IMO0002 Reserved for concepts with insufficient information to code with codable children: Secondary | ICD-10-CM

## 2011-12-20 DIAGNOSIS — L259 Unspecified contact dermatitis, unspecified cause: Secondary | ICD-10-CM

## 2011-12-20 DIAGNOSIS — Z86010 Personal history of colonic polyps: Secondary | ICD-10-CM

## 2011-12-20 NOTE — Progress Notes (Signed)
Ernest Hudson, a 56 y.o. 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 He presents to clinic today to discuss the possibility of a genetic predisposition to cancer, and to further clarify his risks, as well as his family members' risks for cancer.   HISTORY OF PRESENT ILLNESS: Ernest Hudson is a 56 y.o. male with no personal history of cancer.  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  Past Medical History  Diagnosis Date  . Allergic rhinitis   . Hypertension   . Hx of colonic polyps   . Hyperlipidemia   . Bradycardia   . Dyslipidemia (high LDL; low HDL)   . Pneumothorax 1995  . Cancer     Past Surgical History  Procedure Date  . Sponstaneous pneumothorax 10/05  . Cholecystectomy 2004  . External ear surgery 1971  . Colonoscopy 5-07  . Cardiolyte stress 3/10    History  Substance Use Topics  . Smoking status: Current Every Day Smoker -- 1.0 packs/day    Types: Cigarettes  . Smokeless tobacco: Never Used  . Alcohol Use: No   FAMILY HISTORY:  We obtained a detailed, 4-generation family history.  Significant diagnoses are listed below: Family History  Problem Relation Age of Onset  . Cancer Mother     breast  . Diabetes Mother   . Heart attack Father   . Diabetes Father   . Cancer Father     prostate   . Healthy Sister   . Lung disease Sister     has blebs on MRI,   . Healthy Brother   . Healthy Brother   . Breast cancer Maternal Aunt     diagnosed in her 75s  . Melanoma Maternal Uncle   . Breast cancer Maternal Aunt     diagnosed in her 7s  . Breast cancer Maternal Aunt     diagnosed in her 72s  . Cancer Cousin     multiple myeloma  Family history is  unchanged from his October 09, 2011 appointment.  GENETIC COUNSELING RISK ASSESSMENT, DISCUSSION, AND SUGGESTED FOLLOW UP: We reviewed the genetic change that was found and is diagnostic for BHD.  This mutation has been described in the literature in a large [facility].  This [facility] had pneumothoraces and renal cancer.  While there was a report or two of colon polyps, there were no reports of colon cancer.  At this time there are not any national guidelines for the management and treatment for BHD.  Two papers suggest that kidneys should be scanned once every 1-3 years via MRI to screen for renal cancer.  At this time, Ernest Hudson does not have findings consistent with renal cell carcinoma or other common tumors found in BHD.  Therefore, he does not meet criteria for a renal study at the [facility].  We discussed that his siblings and daughter are at 50% risk of carrying the same mutation.  He has notified his siblings of this risk, and plans on letting his cousins know at Christmas time when he sees them of his diagnosis.  The patient was given an email address for another patient with BHD as a contact.   The patient was seen for a total  of 40 minutes, greater than 50% of which was spent face-to-face counseling.  This plan is being carried out per Dr. Thayer Ohm recommendations.  This note will also be sent to the referring provider via the electronic medical record. The patient will be supplied with a summary of this genetic counseling discussion as well as educational information on the discussed hereditary cancer syndromes following the conclusion of their visit.   CC BY [place] MAIL: Campbell Stall, MD  EDUCATIONAL INFORMATION SUPPLIED TO PATIENT AT ENCOUNTER:  Email address for contact, literature about the diagnosis and management for BHD, articles describing a [facility] with the same mutation found in the patient.   _______________________________________________________________________ For Office Staff:    Number of people involved in session: 3 Was an Intern/ student involved with case: no

## 2011-12-29 ENCOUNTER — Encounter: Payer: Self-pay | Admitting: Internal Medicine

## 2011-12-29 ENCOUNTER — Ambulatory Visit (INDEPENDENT_AMBULATORY_CARE_PROVIDER_SITE_OTHER): Payer: 59 | Admitting: Internal Medicine

## 2011-12-29 VITALS — BP 122/80 | Temp 98.2°F | Wt 204.0 lb

## 2011-12-29 DIAGNOSIS — J069 Acute upper respiratory infection, unspecified: Secondary | ICD-10-CM

## 2011-12-29 DIAGNOSIS — I10 Essential (primary) hypertension: Secondary | ICD-10-CM

## 2011-12-29 DIAGNOSIS — J309 Allergic rhinitis, unspecified: Secondary | ICD-10-CM

## 2011-12-29 MED ORDER — METHYLPREDNISOLONE ACETATE 80 MG/ML IJ SUSP
80.0000 mg | Freq: Once | INTRAMUSCULAR | Status: AC
  Administered 2011-12-29: 80 mg via INTRAMUSCULAR

## 2011-12-29 NOTE — Addendum Note (Signed)
Addended by: Duard Brady I on: 12/29/2011 12:54 PM   Modules accepted: Orders

## 2011-12-29 NOTE — Patient Instructions (Signed)
Acute bronchitis symptoms for less than 10 days are generally not helped by antibiotics.  Take over-the-counter expectorants and cough medications such as  Mucinex DM.  Call if there is no improvement in 5 to 7 days or if he developed worsening cough, fever, or new symptoms, such as shortness of breath or chest pain.    

## 2011-12-29 NOTE — Progress Notes (Signed)
Subjective:    Patient ID: Ernest Hudson, male    DOB: 04-Nov-1955, 56 y.o.   MRN: [id number]  HPI  56 year old patient who has treated hypertension and also a history of allergic rhinitis. He presents with 5 day history of head and chest congestion and cough. There's been no fever or sputum production. He is on maintenance Zyrtec and Flonase. His blood pressure has been well-controlled He will be set him in for his pastor and will be given the Sunday service in 2 days  Past Medical History  Diagnosis Date  . Allergic rhinitis   . Hypertension   . Hx of colonic polyps   . Hyperlipidemia   . Bradycardia   . Dyslipidemia (high LDL; low HDL)   . Pneumothorax 1995  . Cancer     History   Social History  . Marital Status: Married    Spouse Name: N/A    Number of Children: N/A  . Years of Education: N/A   Occupational History  . Not on file.   Social History Main Topics  . Smoking status: Current Every Day Smoker -- 1.0 packs/day    Types: Cigarettes  . Smokeless tobacco: Never Used  . Alcohol Use: No  . Drug Use: No  . Sexually Active: Not on file   Other Topics Concern  . Not on file   Social History Narrative  . No narrative on file    Past Surgical History  Procedure Date  . Sponstaneous pneumothorax 10/05  . Cholecystectomy 2004  . External ear surgery 1971  . Colonoscopy 5-07  . Cardiolyte stress 3/10    Family History  Problem Relation Age of Onset  . Cancer Mother     breast  . Diabetes Mother   . Heart attack Father   . Diabetes Father   . Cancer Father     prostate   . Healthy Sister   . Lung disease Sister     has blebs on MRI,   . Healthy Brother   . Healthy Brother   . Breast cancer Maternal Aunt     diagnosed in her 32s  . Melanoma Maternal Uncle   . Breast cancer Maternal Aunt     diagnosed in her 37s  . Breast cancer Maternal Aunt     diagnosed in her 53s  . Cancer Cousin     multiple myeloma    Allergies  Allergen Reactions    . Niacin     REACTION: rash    Current Outpatient Prescriptions on File Prior to Visit  Medication Sig Dispense Refill  . amLODipine (NORVASC) 10 MG tablet Take 1 tablet (10 mg total) by mouth daily.  90 tablet  3  . aspirin EC 81 MG tablet Take 81 mg by mouth daily.      . cetirizine (ZYRTEC) 10 MG tablet Take 10 mg by mouth daily.        . fluticasone (FLONASE) 50 MCG/ACT nasal spray Place 2 sprays into the nose daily as needed. For allergies.      Marland Kitchen lisinopril (PRINIVIL,ZESTRIL) 40 MG tablet Take 1 tablet (40 mg total) by mouth daily.  90 tablet  6  . oxyCODONE-acetaminophen (PERCOCET/ROXICET) 5-325 MG per tablet Take 2 tablets by mouth every 4 (four) hours as needed for pain.  10 tablet  0  . sulfamethoxazole-trimethoprim (SEPTRA DS) 800-160 MG per tablet Take 1 tablet by mouth every 12 (twelve) hours.  20 tablet  0  . vitamin B-12 (CYANOCOBALAMIN) 1000 MCG  tablet Take 1,000 mcg by mouth daily.        BP 122/80  Temp 98.2 F (36.8 C) (Oral)  Wt 204 lb (92.534 kg)       Review of Systems  Constitutional: Positive for fatigue. Negative for fever, chills and appetite change.  HENT: Positive for congestion, rhinorrhea and postnasal drip. Negative for hearing loss, ear pain, sore throat, trouble swallowing, neck stiffness, dental problem, voice change and tinnitus.   Eyes: Negative for pain, discharge and visual disturbance.  Respiratory: Positive for cough. Negative for chest tightness, wheezing and stridor.   Cardiovascular: Negative for chest pain, palpitations and leg swelling.  Gastrointestinal: Negative for nausea, vomiting, abdominal pain, diarrhea, constipation, blood in stool and abdominal distention.  Genitourinary: Negative for urgency, hematuria, flank pain, discharge, difficulty urinating and genital sores.  Musculoskeletal: Negative for myalgias, back pain, joint swelling, arthralgias and gait problem.  Skin: Negative for rash.  Neurological: Negative for dizziness,  syncope, speech difficulty, weakness, numbness and headaches.  Hematological: Negative for adenopathy. Does not bruise/bleed easily.  Psychiatric/Behavioral: Negative for behavioral problems and dysphoric mood. The patient is not nervous/anxious.        Objective:   Physical Exam  Constitutional: He is oriented to person, place, and time. He appears well-developed.  HENT:  Head: Normocephalic.  Right Ear: External ear normal.  Left Ear: External ear normal.       Congested  Eyes: Conjunctivae normal and EOM are normal.  Neck: Normal range of motion.  Cardiovascular: Normal rate and normal heart sounds.   Pulmonary/Chest: Breath sounds normal.  Abdominal: Bowel sounds are normal.  Musculoskeletal: Normal range of motion. He exhibits no edema and no tenderness.  Neurological: He is alert and oriented to person, place, and time.  Psychiatric: He has a normal mood and affect. His behavior is normal.          Assessment & Plan:   Viral URI/allergic rhinitis. Will treat aggressively with Depo-Medrol to minimize this symptoms Hypertension stable

## 2012-01-05 ENCOUNTER — Telehealth: Payer: Self-pay | Admitting: Internal Medicine

## 2012-01-05 ENCOUNTER — Encounter: Payer: Self-pay | Admitting: Internal Medicine

## 2012-01-05 ENCOUNTER — Ambulatory Visit (INDEPENDENT_AMBULATORY_CARE_PROVIDER_SITE_OTHER): Payer: 59 | Admitting: Internal Medicine

## 2012-01-05 VITALS — BP 150/94 | HR 80 | Temp 98.1°F | Resp 18 | Wt 206.0 lb

## 2012-01-05 DIAGNOSIS — A499 Bacterial infection, unspecified: Secondary | ICD-10-CM

## 2012-01-05 DIAGNOSIS — F17200 Nicotine dependence, unspecified, uncomplicated: Secondary | ICD-10-CM

## 2012-01-05 DIAGNOSIS — Z720 Tobacco use: Secondary | ICD-10-CM

## 2012-01-05 DIAGNOSIS — J069 Acute upper respiratory infection, unspecified: Secondary | ICD-10-CM

## 2012-01-05 DIAGNOSIS — B9689 Other specified bacterial agents as the cause of diseases classified elsewhere: Secondary | ICD-10-CM

## 2012-01-05 DIAGNOSIS — J329 Chronic sinusitis, unspecified: Secondary | ICD-10-CM

## 2012-01-05 MED ORDER — AMOXICILLIN-POT CLAVULANATE 875-125 MG PO TABS: 1.0000 | ORAL_TABLET | Freq: Two times a day (BID) | ORAL | Status: DC

## 2012-01-05 NOTE — Patient Instructions (Signed)
    Use saline irrigation, warm  moist compresses and over-the-counter decongestants only as directed.  Call if there is no improvement in 5 to 7 days, or sooner if you develop increasing pain, fever, or any new symptoms.  Take your antibiotic as prescribed until ALL of it is gone, but stop if you develop a rash, swelling, or any side effects of the medication.  Contact our office as soon as possible if  there are side effects of the medication. 

## 2012-01-05 NOTE — Progress Notes (Signed)
Subjective:    Patient ID: Ernest Hudson, male    DOB: 07/29/1955, 56 y.o.   MRN: [id number]  HPI  56 year old patient who has a history of tobacco use and was treated about one week ago symptomatically for a URI. With the past several days she has developed low-grade fever headaches and left facial pain. He has developed more purulent drainage assessment from the left nares. He describes dental discomfort.  Past Medical History  Diagnosis Date  . Allergic rhinitis   . Hypertension   . Hx of colonic polyps   . Hyperlipidemia   . Bradycardia   . Dyslipidemia (high LDL; low HDL)   . Pneumothorax 1995  . Cancer     History   Social History  . Marital Status: Married    Spouse Name: N/A    Number of Children: N/A  . Years of Education: N/A   Occupational History  . Not on file.   Social History Main Topics  . Smoking status: Current Every Day Smoker -- 1.0 packs/day    Types: Cigarettes  . Smokeless tobacco: Never Used  . Alcohol Use: No  . Drug Use: No  . Sexually Active: Not on file   Other Topics Concern  . Not on file   Social History Narrative  . No narrative on file    Past Surgical History  Procedure Date  . Sponstaneous pneumothorax 10/05  . Cholecystectomy 2004  . External ear surgery 1971  . Colonoscopy 5-07  . Cardiolyte stress 3/10    Family History  Problem Relation Age of Onset  . Cancer Mother     breast  . Diabetes Mother   . Heart attack Father   . Diabetes Father   . Cancer Father     prostate   . Healthy Sister   . Lung disease Sister     has blebs on MRI,   . Healthy Brother   . Healthy Brother   . Breast cancer Maternal Aunt     diagnosed in her 22s  . Melanoma Maternal Uncle   . Breast cancer Maternal Aunt     diagnosed in her 25s  . Breast cancer Maternal Aunt     diagnosed in her 19s  . Cancer Cousin     multiple myeloma    Allergies  Allergen Reactions  . Niacin     REACTION: rash    Current Outpatient  Prescriptions on File Prior to Visit  Medication Sig Dispense Refill  . amLODipine (NORVASC) 10 MG tablet Take 1 tablet (10 mg total) by mouth daily.  90 tablet  3  . aspirin EC 81 MG tablet Take 81 mg by mouth daily.      . cetirizine (ZYRTEC) 10 MG tablet Take 10 mg by mouth daily.        . fluticasone (FLONASE) 50 MCG/ACT nasal spray Place 2 sprays into the nose daily as needed. For allergies.      Marland Kitchen lisinopril (PRINIVIL,ZESTRIL) 40 MG tablet Take 1 tablet (40 mg total) by mouth daily.  90 tablet  6  . vitamin B-12 (CYANOCOBALAMIN) 1000 MCG tablet Take 1,000 mcg by mouth daily.      Marland Kitchen oxyCODONE-acetaminophen (PERCOCET/ROXICET) 5-325 MG per tablet Take 2 tablets by mouth every 4 (four) hours as needed for pain.  10 tablet  0  . sulfamethoxazole-trimethoprim (SEPTRA DS) 800-160 MG per tablet Take 1 tablet by mouth every 12 (twelve) hours.  20 tablet  0    BP 150/94  Pulse 80  Temp 98.1 F (36.7 C) (Oral)  Resp 18  Wt 206 lb (93.441 kg)  SpO2 98%       Review of Systems  Constitutional: Negative for fever, chills, appetite change and fatigue.  HENT: Positive for congestion and rhinorrhea. Negative for hearing loss, ear pain, sore throat, trouble swallowing, neck stiffness, dental problem, voice change and tinnitus.   Eyes: Negative for pain, discharge and visual disturbance.  Respiratory: Negative for cough, chest tightness, wheezing and stridor.   Cardiovascular: Negative for chest pain, palpitations and leg swelling.  Gastrointestinal: Negative for nausea, vomiting, abdominal pain, diarrhea, constipation, blood in stool and abdominal distention.  Genitourinary: Negative for urgency, hematuria, flank pain, discharge, difficulty urinating and genital sores.  Musculoskeletal: Negative for myalgias, back pain, joint swelling, arthralgias and gait problem.  Skin: Negative for rash.  Neurological: Positive for weakness and headaches. Negative for dizziness, syncope, speech difficulty  and numbness.  Hematological: Negative for adenopathy. Does not bruise/bleed easily.  Psychiatric/Behavioral: Negative for behavioral problems and dysphoric mood. The patient is not nervous/anxious.        Objective:   Physical Exam  Constitutional: He is oriented to person, place, and time. He appears well-developed.  HENT:  Head: Normocephalic.  Right Ear: External ear normal.  Left Ear: External ear normal.       Tender over the left maxillary sinus region  Eyes: Conjunctivae normal and EOM are normal.  Neck: Normal range of motion.  Cardiovascular: Normal rate and normal heart sounds.   Pulmonary/Chest: Breath sounds normal.  Abdominal: Bowel sounds are normal.  Musculoskeletal: Normal range of motion. He exhibits no edema and no tenderness.  Neurological: He is alert and oriented to person, place, and time.  Psychiatric: He has a normal mood and affect. His behavior is normal.          Assessment & Plan:   Acute left maxillary sinusitis . Will treat with antibiotic therapy we'll continue her decongestants expectorants and the saline irrigation cessation of smoking encouraged We'll call if unimproved

## 2012-01-05 NOTE — Telephone Encounter (Signed)
Caller: [name]/Patient; Patient Name: Ernest Hudson; PCP: Eleonore Chiquito [facility]); Best Callback Phone Number: [phone], follows office visit 12/29/11 given Cortisone, no improvement, head hurts to ear, sinus issues: green nasal dischargre, afebrile. Guideline: URI, Disposition: See within 24 hours due to no improvement, patient agrees to 1600 appoitment 01/05/12 with DR Lesia Hausen.

## 2012-01-05 NOTE — Telephone Encounter (Signed)
No follow up required, note closed

## 2012-02-05 ENCOUNTER — Other Ambulatory Visit: Payer: Self-pay | Admitting: Internal Medicine

## 2012-04-30 ENCOUNTER — Other Ambulatory Visit (INDEPENDENT_AMBULATORY_CARE_PROVIDER_SITE_OTHER): Payer: 59

## 2012-04-30 DIAGNOSIS — Z0000 Encounter for general adult medical examination without abnormal findings: Secondary | ICD-10-CM

## 2012-04-30 LAB — HEPATIC FUNCTION PANEL
ALT: 36 U/L (ref 0–53)
Albumin: 4.1 g/dL (ref 3.5–5.2)
Total Protein: 6.7 g/dL (ref 6.0–8.3)

## 2012-04-30 LAB — BASIC METABOLIC PANEL
BUN: 18 mg/dL (ref 6–23)
Chloride: 107 mEq/L (ref 96–112)
Potassium: 5.5 mEq/L — ABNORMAL HIGH (ref 3.5–5.1)

## 2012-04-30 LAB — CBC WITH DIFFERENTIAL/PLATELET
Basophils Relative: 0.2 % (ref 0.0–3.0)
Eosinophils Relative: 4.7 % (ref 0.0–5.0)
HCT: 46.3 % (ref 39.0–52.0)
Lymphs Abs: 2.2 10*3/uL (ref 0.7–4.0)
MCV: 88.2 fl (ref 78.0–100.0)
Monocytes Absolute: 0.4 10*3/uL (ref 0.1–1.0)
Neutro Abs: 3.8 10*3/uL (ref 1.4–7.7)
Platelets: 220 10*3/uL (ref 150.0–400.0)
RBC: 5.25 Mil/uL (ref 4.22–5.81)
WBC: 6.7 10*3/uL (ref 4.5–10.5)

## 2012-04-30 LAB — TSH: TSH: 3.17 u[IU]/mL (ref 0.35–5.50)

## 2012-04-30 LAB — PSA: PSA: 0.84 ng/mL (ref 0.10–4.00)

## 2012-04-30 LAB — LIPID PANEL
LDL Cholesterol: 137 mg/dL — ABNORMAL HIGH (ref 0–99)
Total CHOL/HDL Ratio: 6

## 2012-04-30 LAB — POCT URINALYSIS DIPSTICK
Blood, UA: NEGATIVE
Nitrite, UA: NEGATIVE
Urobilinogen, UA: 0.2
pH, UA: 5

## 2012-05-07 ENCOUNTER — Encounter: Payer: Self-pay | Admitting: Internal Medicine

## 2012-05-07 ENCOUNTER — Ambulatory Visit (INDEPENDENT_AMBULATORY_CARE_PROVIDER_SITE_OTHER): Payer: 59 | Admitting: Internal Medicine

## 2012-05-07 VITALS — BP 150/90 | HR 66 | Temp 98.1°F | Resp 18 | Ht 70.0 in | Wt 202.0 lb

## 2012-05-07 DIAGNOSIS — Q898 Other specified congenital malformations: Secondary | ICD-10-CM

## 2012-05-07 DIAGNOSIS — Q8789 Other specified congenital malformation syndromes, not elsewhere classified: Secondary | ICD-10-CM | POA: Insufficient documentation

## 2012-05-07 MED ORDER — LISINOPRIL 40 MG PO TABS: ORAL_TABLET | ORAL | Status: DC

## 2012-05-07 MED ORDER — FLUTICASONE PROPIONATE 50 MCG/ACT NA SUSP: 2.0000 | Freq: Every day | NASAL | Status: DC | PRN

## 2012-05-07 MED ORDER — AMLODIPINE BESYLATE 10 MG PO TABS: 10.0000 mg | ORAL_TABLET | Freq: Every day | ORAL | Status: DC

## 2012-05-07 NOTE — Progress Notes (Signed)
Subjective:    Patient ID: Ernest Hudson, male    DOB: 10/13/1955, 57 y.o.   MRN: [id number]  HPI 57 -year-old patient who is seen today for an annual exam. Medical problems include hypertension ongoing tobacco abuse and mild dyslipidemia. He is doing quite well.  Otherwise done quite well he has allergic rhinitis which has been stable. Laboratory studies were reviewed. Continues to smoke tobacco Last colonoscopy 2007 with hyperplastic polyps  Past Medical History  Diagnosis Date  . Allergic rhinitis   . Hypertension   . Hx of colonic polyps   . Hyperlipidemia   . Bradycardia   . Dyslipidemia (high LDL; low HDL)   . Pneumothorax 1995  . Cancer     History   Social History  . Marital Status: Married    Spouse Name: N/A    Number of Children: N/A  . Years of Education: N/A   Occupational History  . Not on file.   Social History Main Topics  . Smoking status: Current Every Day Smoker -- 1.00 packs/day    Types: Cigarettes  . Smokeless tobacco: Never Used  . Alcohol Use: No  . Drug Use: No  . Sexually Active: Not on file   Other Topics Concern  . Not on file   Social History Narrative  . No narrative on file    Past Surgical History  Procedure Laterality Date  . Sponstaneous pneumothorax  10/05  . Cholecystectomy  2004  . External ear surgery  1971  . Colonoscopy  5-07  . Cardiolyte stress  3/10    Family History  Problem Relation Age of Onset  . Cancer Mother     breast  . Diabetes Mother   . Heart attack Father   . Diabetes Father   . Cancer Father     prostate   . Healthy Sister   . Lung disease Sister     has blebs on MRI,   . Healthy Brother   . Healthy Brother   . Breast cancer Maternal Aunt     diagnosed in her 58s  . Melanoma Maternal Uncle   . Breast cancer Maternal Aunt     diagnosed in her 58s  . Breast cancer Maternal Aunt     diagnosed in her 58s  . Cancer Cousin     multiple myeloma    Allergies  Allergen Reactions  .  Niacin     REACTION: rash    Current Outpatient Prescriptions on File Prior to Visit  Medication Sig Dispense Refill  . aspirin EC 81 MG tablet Take 81 mg by mouth daily.      . cetirizine (ZYRTEC) 10 MG tablet Take 10 mg by mouth daily.        . vitamin B-12 (CYANOCOBALAMIN) 1000 MCG tablet Take 1,000 mcg by mouth daily.       No current facility-administered medications on file prior to visit.    BP 150/90  Pulse 66  Temp(Src) 98.1 F (36.7 C) (Oral)  Resp 18  Ht 5\' 10"  (1.778 m)  Wt 202 lb (91.627 kg)  BMI 28.98 kg/m2  SpO2 98%      Review of Systems  Constitutional: Negative for fever, chills, activity change, appetite change and fatigue.  HENT: Negative for hearing loss, ear pain, congestion, rhinorrhea, sneezing, mouth sores, trouble swallowing, neck pain, neck stiffness, dental problem, voice change, sinus pressure and tinnitus.   Eyes: Negative for photophobia, pain, redness and visual disturbance.  Respiratory: Negative for apnea,  cough, choking, chest tightness, shortness of breath and wheezing.   Cardiovascular: Negative for chest pain, palpitations and leg swelling.  Gastrointestinal: Negative for nausea, vomiting, abdominal pain, diarrhea, constipation, blood in stool, abdominal distention, anal bleeding and rectal pain.  Genitourinary: Negative for dysuria, urgency, frequency, hematuria, flank pain, decreased urine volume, discharge, penile swelling, scrotal swelling, difficulty urinating, genital sores and testicular pain.  Musculoskeletal: Negative for myalgias, back pain, joint swelling, arthralgias and gait problem.  Skin: Negative for color change, rash and wound.  Neurological: Negative for dizziness, tremors, seizures, syncope, facial asymmetry, speech difficulty, weakness, light-headedness, numbness and headaches.  Hematological: Negative for adenopathy. Does not bruise/bleed easily.  Psychiatric/Behavioral: Negative for suicidal ideas, hallucinations,  behavioral problems, confusion, sleep disturbance, self-injury, dysphoric mood, decreased concentration and agitation. The patient is not nervous/anxious.   Preventive Screening-Counseling & Management  Alcohol-Tobacco  Smoking Status: current   Allergies:  1) ! Niacin   Past History:  Past Medical History:  Reviewed history from 12/31/2008 and no changes required.  Allergic rhinitis  Hypertension  Colonic polyps, hx of  Hyperlipidemia  bradycardia  Low HDL cholesterol  Birt-Hogg-Dube Syndrome   Past Surgical History:  Reviewed history from 10/30/2008 and no changes required.  spontaneous pneumothorax 10/05  Cholecystectomy 2004  ear surgery 1971  colonoscopy 5-07 (hyperplastic polyps Cardiolyte Stress 3-10   Family History:  Reviewed history from 10/29/2007 and no changes required.  father status post MI, history of diabetes. prostate ca  Mother, status post breast cancer, history of diabetes  two brothers and one sister in good health   Social History:  Reviewed history from 10/29/2007 and no changes required.  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  HENT:  Head: Normocephalic and atraumatic.  Right Ear: External ear normal.  Left Ear: External ear normal.  Nose: Nose normal.  Mouth/Throat: Oropharynx is clear and moist.  Eyes: Conjunctivae and EOM are normal. Pupils are equal, round, and reactive to light. No scleral icterus.  Neck: Normal range of motion. Neck supple. No JVD present. No thyromegaly present.  Cardiovascular: Regular rhythm, normal heart sounds and intact distal pulses.  Exam reveals no gallop and no friction rub.   No murmur heard. Posterior tibial pulses full. Dorsalis pedis pulses faint  Pulmonary/Chest: Effort normal and breath sounds normal. He exhibits no tenderness.  Abdominal: Soft. Bowel  sounds are normal. He exhibits no distension and no mass. There is no tenderness.  Genitourinary: Prostate normal and penis normal.  Musculoskeletal: Normal range of motion. He exhibits no edema and no tenderness.  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  Neurological: He is alert. He has normal reflexes. No cranial nerve deficit. Coordination normal.  Skin: Skin is warm and dry. No rash noted.  Psychiatric: He has a normal mood and affect. His behavior is normal.          Assessment & Plan:   Preventive health examination Hypertension stable. Repeat blood pressure 130/82 Mild dyslipidemia. More vigorous exercise weight loss encouraged heart healthy diet encouraged History colonic polyps. He has been notified by GI. These  were hyperplastic and followup colonoscopy in 10 years was recommended Ongoing tobacco use smoking cessation again discussed and encouraged BHD Syndrome-  Will check renal U/S

## 2012-05-07 NOTE — Patient Instructions (Addendum)
Limit your sodium (Salt) intake    It is important that you exercise regularly, at least 20 minutes 3 to 4 times per week.  If you develop chest pain or shortness of breath seek  medical attention.  Smoking tobacco is very bad for your health. You should stop smoking immediately.  Please check your blood pressure on a regular basis.  If it is consistently greater than 150/90, please make an office appointment.  Renal ultrasound as discussed

## 2012-05-14 ENCOUNTER — Ambulatory Visit
Admission: RE | Admit: 2012-05-14 | Discharge: 2012-05-14 | Disposition: A | Discharge status: Home / Self Care | Payer: 59 | Source: Ambulatory Visit | Attending: Internal Medicine | Admitting: Internal Medicine

## 2012-05-14 DIAGNOSIS — Q8789 Other specified congenital malformation syndromes, not elsewhere classified: Secondary | ICD-10-CM

## 2012-07-30 ENCOUNTER — Emergency Department (HOSPITAL_COMMUNITY)
Admission: EM | Admit: 2012-07-30 | Discharge: 2012-07-30 | Disposition: A | Discharge status: Home / Self Care | Payer: 59 | Attending: Emergency Medicine | Admitting: Emergency Medicine

## 2012-07-30 ENCOUNTER — Encounter (HOSPITAL_COMMUNITY): Payer: Self-pay | Admitting: Emergency Medicine

## 2012-07-30 ENCOUNTER — Emergency Department (HOSPITAL_COMMUNITY): Payer: 59

## 2012-07-30 ENCOUNTER — Telehealth: Payer: Self-pay | Admitting: Internal Medicine

## 2012-07-30 DIAGNOSIS — Z860100 Personal history of colon polyps, unspecified: Secondary | ICD-10-CM | POA: Insufficient documentation

## 2012-07-30 DIAGNOSIS — Z79899 Other long term (current) drug therapy: Secondary | ICD-10-CM | POA: Insufficient documentation

## 2012-07-30 DIAGNOSIS — F17200 Nicotine dependence, unspecified, uncomplicated: Secondary | ICD-10-CM | POA: Insufficient documentation

## 2012-07-30 DIAGNOSIS — Z8679 Personal history of other diseases of the circulatory system: Secondary | ICD-10-CM | POA: Insufficient documentation

## 2012-07-30 DIAGNOSIS — Z8739 Personal history of other diseases of the musculoskeletal system and connective tissue: Secondary | ICD-10-CM | POA: Insufficient documentation

## 2012-07-30 DIAGNOSIS — Z8709 Personal history of other diseases of the respiratory system: Secondary | ICD-10-CM | POA: Insufficient documentation

## 2012-07-30 DIAGNOSIS — Z872 Personal history of diseases of the skin and subcutaneous tissue: Secondary | ICD-10-CM | POA: Insufficient documentation

## 2012-07-30 DIAGNOSIS — R0789 Other chest pain: Secondary | ICD-10-CM | POA: Insufficient documentation

## 2012-07-30 DIAGNOSIS — I10 Essential (primary) hypertension: Secondary | ICD-10-CM | POA: Insufficient documentation

## 2012-07-30 DIAGNOSIS — Z7982 Long term (current) use of aspirin: Secondary | ICD-10-CM | POA: Insufficient documentation

## 2012-07-30 DIAGNOSIS — Z859 Personal history of malignant neoplasm, unspecified: Secondary | ICD-10-CM | POA: Insufficient documentation

## 2012-07-30 DIAGNOSIS — E785 Hyperlipidemia, unspecified: Secondary | ICD-10-CM | POA: Insufficient documentation

## 2012-07-30 LAB — CBC
HCT: 43.7 % (ref 39.0–52.0)
Hemoglobin: 15.9 g/dL (ref 13.0–17.0)
RDW: 13 % (ref 11.5–15.5)
WBC: 8.2 10*3/uL (ref 4.0–10.5)

## 2012-07-30 LAB — BASIC METABOLIC PANEL
BUN: 14 mg/dL (ref 6–23)
Chloride: 105 mEq/L (ref 96–112)
GFR calc Af Amer: >90 mL/min (ref >90)
Potassium: 3.7 mEq/L (ref 3.5–5.1)
Sodium: 141 mEq/L (ref 135–145)

## 2012-07-30 LAB — POCT I-STAT TROPONIN I

## 2012-07-30 NOTE — Telephone Encounter (Signed)
ED Notification 

## 2012-07-30 NOTE — ED Notes (Signed)
Pt c/o left sided CP worse with deep inspiration; pt sts some SOB; pt sts hx of pneumothorax from a syndrome

## 2012-07-30 NOTE — ED Provider Notes (Signed)
History     CSN: [id number] Arrival date & time 07/30/12  1405 First MD Initiated Contact with Patient 07/30/12 1431      Chief Complaint  Patient presents with  . 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   Patient states for last 2 days if he breaths "a certain way" he will feel a twinge of pain in his left outer chest near his axilla. Not associated with shortness of breath though if he breaths quickly, pain will often come more often. Pain is sharp, brief in duration. Moderate in severity. Nonexertional and not relieved by rest. 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 Tried ibuprofen with only minimal relief (but once again pain is infrequent). Patient does a lot of heavy lifting and admits straining things is possible in his line of work.   Patient Active Problem List   Diagnosis Date Noted  . 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 Right arm pain 10/03/2011  . Tobacco abuse 11/03/2010  . DERMATITIS 12/29/2009  . BACK PAIN 12/31/2008  . CHEST PAIN 04/17/2008  . BRADYCARDIA 04/10/2008  . URI 02/24/2008  . HYPERLIPIDEMIA 10/29/2007  . RHINITIS MEDICAMENTOSA 08/26/2007  . COLONIC POLYPS, HX OF 11/02/2006  . HYPERTENSION 10/02/2006  . ALLERGIC RHINITIS 10/02/2006    Past Medical History  Diagnosis Date  . Allergic rhinitis   . Hypertension   . Hx of colonic polyps   . Hyperlipidemia   . Bradycardia   . Dyslipidemia (high LDL; low HDL)   . Pneumothorax 1995  . Cancer     Past Surgical History  Procedure Laterality Date  . Sponstaneous pneumothorax  10/05  . Cholecystectomy  2004  . External ear surgery  1971  . Colonoscopy  5-07  . Cardiolyte stress  3/10    Family History  Problem Relation Age of Onset  . Cancer Mother     breast  . Diabetes Mother   . Heart attack Father   . Diabetes Father   . Cancer Father      prostate   . Healthy Sister   . Lung disease Sister     has blebs on MRI,   . Healthy Brother   . Healthy Brother   . Breast cancer Maternal Aunt     diagnosed in her 70s  . Melanoma Maternal Uncle   . Breast cancer Maternal Aunt     diagnosed in her 60s  . Breast cancer Maternal Aunt     diagnosed in her 27s  . Cancer Cousin     multiple myeloma    History  Substance Use Topics  . Smoking status: Current Every Day Smoker -- 1.00 packs/day    Types: Cigarettes  . Smokeless tobacco: Never Used  . Alcohol Use: No     Review of Systems  Allergies  Niacin  Home Medications   Current Outpatient Rx  Name  Route  Sig  Dispense  Refill  . amLODipine (NORVASC) 10 MG tablet   Oral   Take 1 tablet (10 mg total) by mouth daily.   90 tablet   3   . aspirin EC 81 MG tablet   Oral   Take 81 mg by mouth daily.         Marland Kitchen CALCIUM PO   Oral   Take 1 tablet by mouth daily.         Marland Kitchen  cetirizine (ZYRTEC) 10 MG tablet   Oral   Take 10 mg by mouth daily.           . fluticasone (FLONASE) 50 MCG/ACT nasal spray   Nasal   Place 2 sprays into the nose daily as needed. For allergies.   16 g   6   . lisinopril (PRINIVIL,ZESTRIL) 40 MG tablet      TAKE 1 TABLET BY MOUTH DAILY.   90 tablet   1   . magnesium oxide (MAG-OX) 400 MG tablet   Oral   Take 400 mg by mouth daily.         . Multiple Vitamin (MULTIVITAMIN) tablet   Oral   Take 2 tablets by mouth daily.            BP 121/80  Pulse 51  Temp(Src) 98.8 F (37.1 C) (Oral)  Resp 13  SpO2 97%  Physical Exam  Constitutional: He is oriented to person, place, and time. He appears well-developed and well-nourished. No distress.  HENT:  Head: Normocephalic and atraumatic.  Eyes: EOM are normal. Pupils are equal, round, and reactive to light.  Neck: Normal range of motion. Neck supple.  Cardiovascular: Normal rate and regular rhythm.  Exam reveals no gallop and no friction rub.   No murmur  heard. Pulmonary/Chest: Effort normal and breath sounds normal. He has no wheezes. He has no rales. He exhibits tenderness (patient very tender to deep palpation of left upper chest near axilla).  Abdominal: Soft. Bowel sounds are normal.  Musculoskeletal: Normal range of motion. He exhibits no edema.  Neurological: He is alert and oriented to person, place, and time. He exhibits normal muscle tone.  Skin: Skin is warm and dry.  Psychiatric: He has a normal mood and affect. His behavior is normal.   EKG-75 HR. NSR. Inversions in avL and v1 unchanged form 10/03/11.  ED Course  Procedures (including critical care time)  Labs Reviewed  CBC - Abnormal; Notable for the following:    MCHC 36.4 (*)    All other components within normal limits  BASIC METABOLIC PANEL - Abnormal; Notable for the following:    GFR calc non Af Amer 88 (*)    All other components within normal limits  POCT I-STAT TROPONIN I   Dg Chest 2 View  07/30/2012   *RADIOLOGY REPORT*  Clinical Data: Chest pain and shortness of breath  CHEST - 2 VIEW  Comparison: April 13, 2008  Findings: There is scarring in the left base.  There is blunting of the left costophrenic angle which is stable.  There is no edema or consolidation.  The heart size and pulmonary vascularity are normal.  No adenopathy.  No pneumothorax.  No bone lesions.  There is calcification in both carotid arteries.  IMPRESSION:  Left base scarring.  No edema or dilatation. Bilateral carotid artery   calcification is present.   Original Report Authenticated By: Bretta Bang, M.D.   1. Musculoskeletal chest pain    MDM  57 year old male with history of smoking, pneumothorax, Birt-Hogg-Dube Syndrome presenting with left chest pain near axilla.  Easily reproducible on exam, most likely MSK chest wall pain.  CXR unremarkable except left base scarring. POC troponin negative, CBC and BMET unremarkable. Hemodynamically stable. Wells score of 0-no d-dimer was  obtained as a result.  Advised patient to stop smoking. Disharged home in stable medical condition.          [name], MD 07/30/12 [phone]

## 2012-07-30 NOTE — Telephone Encounter (Signed)
Patient Information:  Caller Name: Chrystian  Phone: [phone]  Patient: Ernest Hudson, Ernest Hudson  Gender: Male  DOB: 06/07/1955  Age: 57 Years  PCP: Eleonore Chiquito [facility])  Office Follow Up:  Does the office need to follow up with this patient?: No  Instructions For The Office: N/A  RN Note:  says wife was headed home to carry him to the ED  Symptoms  Reason For Call & Symptoms: woke up 6/9 with chest pain; worsens today with a deep breath; feels sharp on left side; says it feels like when he had a collapsed lung to that side 73yrs ago; slight SOB; has more invountary deep breaths; has had some dizziness  Reviewed Health History In EMR: Yes  Reviewed Medications In EMR: Yes  Reviewed Allergies In EMR: Yes  Reviewed Surgeries / Procedures: Yes  Date of Onset of Symptoms: 07/29/2012  Guideline(s) Used:  Chest Pain  Disposition Per Guideline:   Go to ED Now  Reason For Disposition Reached:   Difficulty breathing  Advice Given:  N/A  Patient Will Follow Care Advice:  YES

## 2012-07-31 NOTE — ED Provider Notes (Signed)
I saw and evaluated the patient, reviewed the resident's note and I agree with the findings and plan.   .Face to face Exam:  General:  Awake HEENT:  Atraumatic Resp:  Normal effort Abd:  Nondistended Neuro:No focal weakness   Nelia Shi, MD 07/31/12 [phone]

## 2012-08-20 ENCOUNTER — Other Ambulatory Visit: Payer: Self-pay | Admitting: Dermatology

## 2012-11-07 ENCOUNTER — Ambulatory Visit (INDEPENDENT_AMBULATORY_CARE_PROVIDER_SITE_OTHER): Payer: 59 | Admitting: Internal Medicine

## 2012-11-07 ENCOUNTER — Encounter: Payer: Self-pay | Admitting: Internal Medicine

## 2012-11-07 VITALS — BP 132/90 | HR 57 | Temp 97.6°F | Resp 20 | Wt 216.0 lb

## 2012-11-07 DIAGNOSIS — Z720 Tobacco use: Secondary | ICD-10-CM

## 2012-11-07 DIAGNOSIS — Z23 Encounter for immunization: Secondary | ICD-10-CM

## 2012-11-07 DIAGNOSIS — Q898 Other specified congenital malformations: Secondary | ICD-10-CM

## 2012-11-07 DIAGNOSIS — I10 Essential (primary) hypertension: Secondary | ICD-10-CM

## 2012-11-07 DIAGNOSIS — Q8789 Other specified congenital malformation syndromes, not elsewhere classified: Secondary | ICD-10-CM

## 2012-11-07 DIAGNOSIS — F17200 Nicotine dependence, unspecified, uncomplicated: Secondary | ICD-10-CM

## 2012-11-07 MED ORDER — LISINOPRIL 40 MG PO TABS: ORAL_TABLET | ORAL | Status: DC

## 2012-11-07 MED ORDER — AMLODIPINE BESYLATE 10 MG PO TABS: 10.0000 mg | ORAL_TABLET | Freq: Every day | ORAL | Status: DC

## 2012-11-07 NOTE — Patient Instructions (Signed)
Limit your sodium (Salt) intake  Please check your blood pressure on a regular basis.  If it is consistently greater than 150/90, please make an office appointment.  Return in 6 months for follow-up   

## 2012-11-07 NOTE — Progress Notes (Signed)
Subjective:    Patient ID: Ernest Hudson, male    DOB: [birth year], 57 y.o.   MRN: [id number]  HPI  57 year old patient who has treated hypertension and mild dyslipidemia. He is seen today for his six-month followup. He successfully discontinued tobacco products about 3 months ago. He is doing quite well today. He does have a history of BHD syndrome and renal ultrasound 6 months ago was unremarkable.  Past Medical History  Diagnosis Date  . Allergic rhinitis   . Hypertension   . Hx of colonic polyps   . Hyperlipidemia   . Bradycardia   . Dyslipidemia (high LDL; low HDL)   . Pneumothorax 1995  . Cancer     History   Social History  . Marital Status: Married    Spouse Name: N/A    Number of Children: N/A  . Years of Education: N/A   Occupational History  . Not on file.   Social History Main Topics  . Smoking status: Former Smoker -- 1.00 packs/day    Types: Cigarettes    Quit date: 07/30/2012  . Smokeless tobacco: Never Used  . Alcohol Use: No  . Drug Use: No  . Sexual Activity: Not on file   Other Topics Concern  . Not on file   Social History Narrative  . No narrative on file    Past Surgical History  Procedure Laterality Date  . Sponstaneous pneumothorax  10/05  . Cholecystectomy  2004  . External ear surgery  1971  . Colonoscopy  5-07  . Cardiolyte stress  3/10    Family History  Problem Relation Age of Onset  . Cancer Mother     breast  . Diabetes Mother   . Heart attack Father   . Diabetes Father   . Cancer Father     prostate   . Healthy Sister   . Lung disease Sister     has blebs on MRI,   . Healthy Brother   . Healthy Brother   . Breast cancer Maternal Aunt     diagnosed in her 55s  . Melanoma Maternal Uncle   . Breast cancer Maternal Aunt     diagnosed in her 78s  . Breast cancer Maternal Aunt     diagnosed in her 54s  . Cancer Cousin     multiple myeloma    Allergies  Allergen Reactions  . Niacin     REACTION: rash     Current Outpatient Prescriptions on File Prior to Visit  Medication Sig Dispense Refill  . aspirin EC 81 MG tablet Take 81 mg by mouth daily.      Marland Kitchen CALCIUM PO Take 1 tablet by mouth daily.      . cetirizine (ZYRTEC) 10 MG tablet Take 10 mg by mouth daily.        . fluticasone (FLONASE) 50 MCG/ACT nasal spray Place 2 sprays into the nose daily as needed. For allergies.  16 g  6  . magnesium oxide (MAG-OX) 400 MG tablet Take 400 mg by mouth daily.      . Multiple Vitamin (MULTIVITAMIN) tablet Take 2 tablets by mouth daily.        No current facility-administered medications on file prior to visit.    BP 132/90  Pulse 57  Temp(Src) 97.6 F (36.4 C) (Oral)  Resp 20  Wt 216 lb (97.977 kg)  BMI 30.99 kg/m2  SpO2 98%       Review of Systems  Constitutional: Negative for fever,  chills, appetite change and fatigue.  HENT: Negative for hearing loss, ear pain, congestion, sore throat, trouble swallowing, neck stiffness, dental problem, voice change and tinnitus.   Eyes: Negative for pain, discharge and visual disturbance.  Respiratory: Negative for cough, chest tightness, wheezing and stridor.   Cardiovascular: Negative for chest pain, palpitations and leg swelling.  Gastrointestinal: Negative for nausea, vomiting, abdominal pain, diarrhea, constipation, blood in stool and abdominal distention.  Genitourinary: Negative for urgency, hematuria, flank pain, discharge, difficulty urinating and genital sores.  Musculoskeletal: Negative for myalgias, back pain, joint swelling, arthralgias and gait problem.  Skin: Negative for rash.  Neurological: Negative for dizziness, syncope, speech difficulty, weakness, numbness and headaches.  Hematological: Negative for adenopathy. Does not bruise/bleed easily.  Psychiatric/Behavioral: Negative for behavioral problems and dysphoric mood. The patient is not nervous/anxious.        Objective:   Physical Exam  Constitutional: He is oriented to  person, place, and time. He appears well-developed.  HENT:  Head: Normocephalic.  Right Ear: External ear normal.  Left Ear: External ear normal.  Eyes: Conjunctivae and EOM are normal.  Neck: Normal range of motion.  Cardiovascular: Normal rate and normal heart sounds.   Pulmonary/Chest: Breath sounds normal.  Abdominal: Bowel sounds are normal.  Musculoskeletal: Normal range of motion. He exhibits no edema and no tenderness.  Neurological: He is alert and oriented to person, place, and time.  Psychiatric: He has a normal mood and affect. His behavior is normal.          Assessment & Plan:   Hypertension stable History tobacco use. Ongoing abstinence encouraged  CPX 6 months

## 2012-11-30 ENCOUNTER — Encounter (HOSPITAL_COMMUNITY): Payer: Self-pay | Admitting: Emergency Medicine

## 2012-11-30 ENCOUNTER — Emergency Department (HOSPITAL_COMMUNITY)
Admission: EM | Admit: 2012-11-30 | Discharge: 2012-11-30 | Disposition: A | Discharge status: Home / Self Care | Payer: 59 | Attending: Emergency Medicine | Admitting: Emergency Medicine

## 2012-11-30 ENCOUNTER — Emergency Department (HOSPITAL_COMMUNITY): Payer: 59

## 2012-11-30 DIAGNOSIS — Y990 Civilian activity done for income or pay: Secondary | ICD-10-CM | POA: Insufficient documentation

## 2012-11-30 DIAGNOSIS — Z87891 Personal history of nicotine dependence: Secondary | ICD-10-CM | POA: Insufficient documentation

## 2012-11-30 DIAGNOSIS — IMO0002 Reserved for concepts with insufficient information to code with codable children: Secondary | ICD-10-CM | POA: Insufficient documentation

## 2012-11-30 DIAGNOSIS — Z859 Personal history of malignant neoplasm, unspecified: Secondary | ICD-10-CM | POA: Insufficient documentation

## 2012-11-30 DIAGNOSIS — Z79899 Other long term (current) drug therapy: Secondary | ICD-10-CM | POA: Insufficient documentation

## 2012-11-30 DIAGNOSIS — T148XXA Other injury of unspecified body region, initial encounter: Secondary | ICD-10-CM | POA: Insufficient documentation

## 2012-11-30 DIAGNOSIS — Z860100 Personal history of colon polyps, unspecified: Secondary | ICD-10-CM | POA: Insufficient documentation

## 2012-11-30 DIAGNOSIS — Z8709 Personal history of other diseases of the respiratory system: Secondary | ICD-10-CM | POA: Insufficient documentation

## 2012-11-30 DIAGNOSIS — Y9289 Other specified places as the place of occurrence of the external cause: Secondary | ICD-10-CM | POA: Insufficient documentation

## 2012-11-30 DIAGNOSIS — X503XXA Overexertion from repetitive movements, initial encounter: Secondary | ICD-10-CM | POA: Insufficient documentation

## 2012-11-30 DIAGNOSIS — R0602 Shortness of breath: Secondary | ICD-10-CM | POA: Insufficient documentation

## 2012-11-30 DIAGNOSIS — I10 Essential (primary) hypertension: Secondary | ICD-10-CM | POA: Insufficient documentation

## 2012-11-30 DIAGNOSIS — Z7982 Long term (current) use of aspirin: Secondary | ICD-10-CM | POA: Insufficient documentation

## 2012-11-30 DIAGNOSIS — Z862 Personal history of diseases of the blood and blood-forming organs and certain disorders involving the immune mechanism: Secondary | ICD-10-CM | POA: Insufficient documentation

## 2012-11-30 DIAGNOSIS — Y9389 Activity, other specified: Secondary | ICD-10-CM | POA: Insufficient documentation

## 2012-11-30 DIAGNOSIS — Z8639 Personal history of other endocrine, nutritional and metabolic disease: Secondary | ICD-10-CM | POA: Insufficient documentation

## 2012-11-30 LAB — POCT I-STAT TROPONIN I: Troponin i, poc: 0 ng/mL (ref 0.00–0.08)

## 2012-11-30 LAB — D-DIMER, QUANTITATIVE: D-Dimer, Quant: <0.27 ug/mL-FEU (ref 0.00–0.48)

## 2012-11-30 LAB — BASIC METABOLIC PANEL
CO2: 26 mEq/L (ref 19–32)
Calcium: 9.3 mg/dL (ref 8.4–10.5)
Chloride: 105 mEq/L (ref 96–112)
Creatinine, Ser: 1.02 mg/dL (ref 0.50–1.35)
GFR calc Af Amer: >90 mL/min (ref >90)
GFR calc non Af Amer: 79 mL/min — ABNORMAL LOW (ref >90)

## 2012-11-30 LAB — CBC
HCT: 41.4 % (ref 39.0–52.0)
MCHC: 36.7 g/dL — ABNORMAL HIGH (ref 30.0–36.0)
MCV: 85.2 fL (ref 78.0–100.0)
Platelets: 201 10*3/uL (ref 150–400)
RDW: 12.7 % (ref 11.5–15.5)
WBC: 5.5 10*3/uL (ref 4.0–10.5)

## 2012-11-30 MED ORDER — KETOROLAC TROMETHAMINE 30 MG/ML IJ SOLN
30.0000 mg | Freq: Once | INTRAMUSCULAR | Status: AC
  Administered 2012-11-30: 30 mg via INTRAVENOUS
  Filled 2012-11-30: qty 1

## 2012-11-30 MED ORDER — METHOCARBAMOL 750 MG PO TABS: 750.0000 mg | ORAL_TABLET | Freq: Four times a day (QID) | ORAL | Status: DC

## 2012-11-30 MED ORDER — DIAZEPAM 5 MG PO TABS
5.0000 mg | ORAL_TABLET | Freq: Once | ORAL | Status: AC
  Administered 2012-11-30: 5 mg via ORAL
  Filled 2012-11-30: qty 1

## 2012-11-30 MED ORDER — IBUPROFEN 600 MG PO TABS: 600.0000 mg | ORAL_TABLET | Freq: Four times a day (QID) | ORAL | Status: DC | PRN

## 2012-11-30 NOTE — ED Notes (Signed)
Handed urinal to pt to use

## 2012-11-30 NOTE — ED Notes (Signed)
Reports onset 20 mins ago of back pain and left side chest pain and sob. ekg done at triage.

## 2012-11-30 NOTE — ED Notes (Signed)
Pt was brought back from triage by DJ, EMT and placed in gown and on monitor; pt also placed on continuous pulse oximetry and blood pressure cuff; family at bedside

## 2012-11-30 NOTE — ED Notes (Signed)
Pt returned from being out of the department; pt placed back on monitor, continuous pulse oximetry and blood pressure cuff; family at bedside 

## 2012-11-30 NOTE — ED Provider Notes (Signed)
CSN: [id number]     Arrival date & time 11/30/12  4098 History   First MD Initiated Contact with Patient 11/30/12 [phone]     Chief Complaint  Patient presents with  . Chest Pain  . Shortness of Breath   (Consider location/radiation/quality/duration/timing/severity/associated sxs/prior Treatment) Patient is a 57 y.o. male presenting with chest pain and shortness of breath. The history is provided by the patient.  Chest Pain Associated symptoms: shortness of breath   Shortness of Breath Associated symptoms: chest pain    patient here complaining of left-sided upper back pain that began suddenly characterized as sharp with radiation to his left anterior chest. Pain is worse with taking a deep breath. He is also worse with certain movements. No recent fever or chills. No associated diaphoresis or dyspnea. Patient does work doing Youth worker. Pain is also worse with certain movements of his arm. Does have a history of spontaneous pneumothorax this does not feel similar. Patient also had a normal stress test in 2010. Symptoms are better without moving and no treatment used prior to arrival  Past Medical History  Diagnosis Date  . Allergic rhinitis   . Hypertension   . Hx of colonic polyps   . Hyperlipidemia   . Bradycardia   . Dyslipidemia (high LDL; low HDL)   . Pneumothorax 1995  . Cancer    Past Surgical History  Procedure Laterality Date  . Sponstaneous pneumothorax  10/05  . Cholecystectomy  2004  . External ear surgery  1971  . Colonoscopy  5-07  . Cardiolyte stress  3/10   Family History  Problem Relation Age of Onset  . Cancer Mother     breast  . Diabetes Mother   . Heart attack Father   . Diabetes Father   . Cancer Father     prostate   . Healthy Sister   . Lung disease Sister     has blebs on MRI,   . Healthy Brother   . Healthy Brother   . Breast cancer Maternal Aunt     diagnosed in her 57s  . Melanoma Maternal Uncle   . Breast cancer Maternal Aunt    diagnosed in her 59s  . Breast cancer Maternal Aunt     diagnosed in her 97s  . Cancer Cousin     multiple myeloma   History  Substance Use Topics  . Smoking status: Former Smoker -- 1.00 packs/day    Types: Cigarettes    Quit date: 07/30/2012  . Smokeless tobacco: Never Used  . Alcohol Use: No    Review of Systems  Respiratory: Positive for shortness of breath.   Cardiovascular: Positive for chest pain.  All other systems reviewed and are negative.    Allergies  Niacin  Home Medications   Current Outpatient Rx  Name  Route  Sig  Dispense  Refill  . amLODipine (NORVASC) 10 MG tablet   Oral   Take 1 tablet (10 mg total) by mouth daily.   90 tablet   1   . aspirin EC 81 MG tablet   Oral   Take 81 mg by mouth daily.         Marland Kitchen CALCIUM PO   Oral   Take 1 tablet by mouth daily.         . cetirizine (ZYRTEC) 10 MG tablet   Oral   Take 10 mg by mouth daily.           . fluticasone (FLONASE) 50 MCG/ACT  nasal spray   Nasal   Place 2 sprays into the nose daily as needed. For allergies.   16 g   6   . lisinopril (PRINIVIL,ZESTRIL) 40 MG tablet      TAKE 1 TABLET BY MOUTH DAILY.   90 tablet   1   . magnesium oxide (MAG-OX) 400 MG tablet   Oral   Take 400 mg by mouth daily.         . Multiple Vitamin (MULTIVITAMIN) tablet   Oral   Take 2 tablets by mouth daily.           BP 122/65  Pulse 56  Temp(Src) 97.7 F (36.5 C) (Oral)  Resp 15  SpO2 99% Physical Exam  Nursing note and vitals reviewed. Constitutional: He is oriented to person, place, and time. He appears well-developed and well-nourished.  Non-toxic appearance. No distress.  HENT:  Head: Normocephalic and atraumatic.  Eyes: Conjunctivae, EOM and lids are normal. Pupils are equal, round, and reactive to light.  Neck: Normal range of motion. Neck supple. No tracheal deviation present. No mass present.  Cardiovascular: Normal rate, regular rhythm and normal heart sounds.  Exam reveals no  gallop.   No murmur heard. Pulmonary/Chest: Effort normal and breath sounds normal. No stridor. No respiratory distress. He has no decreased breath sounds. He has no wheezes. He has no rhonchi. He has no rales.    Abdominal: Soft. Normal appearance and bowel sounds are normal. He exhibits no distension. There is no tenderness. There is no rebound and no CVA tenderness.  Musculoskeletal: Normal range of motion. He exhibits no edema and no tenderness.  Neurological: He is alert and oriented to person, place, and time. He has normal strength. No cranial nerve deficit or sensory deficit. GCS eye subscore is 4. GCS verbal subscore is 5. GCS motor subscore is 6.  Skin: Skin is warm and dry. No abrasion and no rash noted.  Psychiatric: He has a normal mood and affect. His speech is normal and behavior is normal.    ED Course  Procedures (including critical care time) Labs Review Labs Reviewed  BASIC METABOLIC PANEL  CBC  D-DIMER, QUANTITATIVE   Imaging Review No results found.  EKG Interpretation     Ventricular Rate:  58 PR Interval:  134 QRS Duration: 100 QT Interval:  416 QTC Calculation: 408 R Axis:   59 Text Interpretation:  Sinus bradycardia Otherwise normal ECG No significant change since last tracing            MDM  No diagnosis found.  Date: 11/30/2012  11:37 AM Patient given meds here for muscle strain and feels better. Tender to palpation at his levator scapula. Do not think that this represents ACS or pulmonary embolism. Likely muscle strain and he is stable for discharge  Toy Baker, MD 11/30/12 1138

## 2012-11-30 NOTE — ED Notes (Signed)
Dr. Allen at the bedside.  

## 2013-04-07 ENCOUNTER — Observation Stay (HOSPITAL_COMMUNITY): Payer: 59 | Admitting: Certified Registered Nurse Anesthetist

## 2013-04-07 ENCOUNTER — Inpatient Hospital Stay (HOSPITAL_COMMUNITY)
Admission: EM | Admit: 2013-04-07 | Discharge: 2013-04-17 | DRG: 330 | Disposition: A | Discharge status: Home / Self Care | Payer: 59 | Attending: Surgery | Admitting: Surgery

## 2013-04-07 ENCOUNTER — Encounter (HOSPITAL_COMMUNITY): Payer: 59 | Admitting: Certified Registered Nurse Anesthetist

## 2013-04-07 ENCOUNTER — Encounter (HOSPITAL_COMMUNITY): Admission: EM | Disposition: A | Discharge status: Home / Self Care | Payer: Self-pay | Source: Home / Self Care

## 2013-04-07 ENCOUNTER — Emergency Department (HOSPITAL_COMMUNITY): Payer: 59

## 2013-04-07 ENCOUNTER — Encounter (HOSPITAL_COMMUNITY): Payer: Self-pay | Admitting: Emergency Medicine

## 2013-04-07 ENCOUNTER — Inpatient Hospital Stay (HOSPITAL_COMMUNITY): Payer: 59

## 2013-04-07 DIAGNOSIS — E785 Hyperlipidemia, unspecified: Secondary | ICD-10-CM | POA: Diagnosis present

## 2013-04-07 DIAGNOSIS — K352 Acute appendicitis with generalized peritonitis, without abscess: Principal | ICD-10-CM | POA: Diagnosis present

## 2013-04-07 DIAGNOSIS — I10 Essential (primary) hypertension: Secondary | ICD-10-CM | POA: Diagnosis present

## 2013-04-07 DIAGNOSIS — Y9210 Unspecified residential institution as the place of occurrence of the external cause: Secondary | ICD-10-CM | POA: Diagnosis not present

## 2013-04-07 DIAGNOSIS — K3580 Unspecified acute appendicitis: Secondary | ICD-10-CM

## 2013-04-07 DIAGNOSIS — Q898 Other specified congenital malformations: Secondary | ICD-10-CM | POA: Diagnosis present

## 2013-04-07 DIAGNOSIS — K9189 Other postprocedural complications and disorders of digestive system: Secondary | ICD-10-CM

## 2013-04-07 DIAGNOSIS — Z79899 Other long term (current) drug therapy: Secondary | ICD-10-CM

## 2013-04-07 DIAGNOSIS — Z5331 Laparoscopic surgical procedure converted to open procedure: Secondary | ICD-10-CM

## 2013-04-07 DIAGNOSIS — Z87891 Personal history of nicotine dependence: Secondary | ICD-10-CM

## 2013-04-07 DIAGNOSIS — Y849 Medical procedure, unspecified as the cause of abnormal reaction of the patient, or of later complication, without mention of misadventure at the time of the procedure: Secondary | ICD-10-CM | POA: Diagnosis not present

## 2013-04-07 DIAGNOSIS — K3532 Acute appendicitis with perforation and localized peritonitis, without abscess: Secondary | ICD-10-CM | POA: Diagnosis present

## 2013-04-07 DIAGNOSIS — N390 Urinary tract infection, site not specified: Secondary | ICD-10-CM | POA: Diagnosis present

## 2013-04-07 DIAGNOSIS — J9819 Other pulmonary collapse: Secondary | ICD-10-CM | POA: Diagnosis not present

## 2013-04-07 DIAGNOSIS — Z791 Long term (current) use of non-steroidal anti-inflammatories (NSAID): Secondary | ICD-10-CM

## 2013-04-07 DIAGNOSIS — K560 Paralytic ileus: Secondary | ICD-10-CM | POA: Diagnosis not present

## 2013-04-07 DIAGNOSIS — R0902 Hypoxemia: Secondary | ICD-10-CM | POA: Diagnosis present

## 2013-04-07 DIAGNOSIS — K929 Disease of digestive system, unspecified: Secondary | ICD-10-CM | POA: Diagnosis not present

## 2013-04-07 DIAGNOSIS — K567 Ileus, unspecified: Secondary | ICD-10-CM | POA: Diagnosis not present

## 2013-04-07 DIAGNOSIS — K35209 Acute appendicitis with generalized peritonitis, without abscess, unspecified as to perforation: Principal | ICD-10-CM | POA: Diagnosis present

## 2013-04-07 DIAGNOSIS — Z7982 Long term (current) use of aspirin: Secondary | ICD-10-CM

## 2013-04-07 HISTORY — DX: Unspecified osteoarthritis, unspecified site: M19.90

## 2013-04-07 HISTORY — DX: Other specified disorders of adrenal gland: E27.8

## 2013-04-07 HISTORY — PX: APPENDECTOMY: SHX54

## 2013-04-07 HISTORY — DX: Other postprocedural complications and disorders of digestive system: K91.89

## 2013-04-07 HISTORY — DX: Other specified congenital malformation syndromes, not elsewhere classified: Q87.89

## 2013-04-07 HISTORY — DX: Ileus, unspecified: K56.7

## 2013-04-07 HISTORY — DX: Personal history of other medical treatment: Z92.89

## 2013-04-07 HISTORY — PX: LAPAROSCOPIC APPENDECTOMY: SHX408

## 2013-04-07 HISTORY — DX: Basal cell carcinoma of skin, unspecified: C44.91

## 2013-04-07 HISTORY — DX: Other pneumothorax: J93.83

## 2013-04-07 HISTORY — PX: PARTIAL COLECTOMY: SHX5273

## 2013-04-07 LAB — CBC WITH DIFFERENTIAL/PLATELET
Basophils Absolute: 0 10*3/uL (ref 0.0–0.1)
Basophils Relative: 0 % (ref 0–1)
EOS ABS: 0 10*3/uL (ref 0.0–0.7)
Eosinophils Relative: 0 % (ref 0–5)
HCT: 44.4 % (ref 39.0–52.0)
HEMOGLOBIN: 16.1 g/dL (ref 13.0–17.0)
LYMPHS PCT: 15 % (ref 12–46)
Lymphs Abs: 2.2 10*3/uL (ref 0.7–4.0)
MCH: 31.6 pg (ref 26.0–34.0)
MCHC: 36.3 g/dL — ABNORMAL HIGH (ref 30.0–36.0)
MCV: 87.2 fL (ref 78.0–100.0)
MONOS PCT: 8 % (ref 3–12)
Monocytes Absolute: 1.2 10*3/uL — ABNORMAL HIGH (ref 0.1–1.0)
NEUTROS PCT: 77 % (ref 43–77)
Neutro Abs: 11.7 10*3/uL — ABNORMAL HIGH (ref 1.7–7.7)
Platelets: 233 10*3/uL (ref 150–400)
RBC: 5.09 MIL/uL (ref 4.22–5.81)
RDW: 12.7 % (ref 11.5–15.5)
WBC: 15.2 10*3/uL — AB (ref 4.0–10.5)

## 2013-04-07 LAB — COMPREHENSIVE METABOLIC PANEL
ALT: 25 U/L (ref 0–53)
AST: 17 U/L (ref 0–37)
Albumin: 3.8 g/dL (ref 3.5–5.2)
Alkaline Phosphatase: 88 U/L (ref 39–117)
BILIRUBIN TOTAL: 0.9 mg/dL (ref 0.3–1.2)
BUN: 15 mg/dL (ref 6–23)
CO2: 27 mEq/L (ref 19–32)
Calcium: 9.4 mg/dL (ref 8.4–10.5)
Chloride: 101 mEq/L (ref 96–112)
Creatinine, Ser: 1.38 mg/dL — ABNORMAL HIGH (ref 0.50–1.35)
GFR calc non Af Amer: 55 mL/min — ABNORMAL LOW (ref >90)
GFR, EST AFRICAN AMERICAN: 64 mL/min — AB (ref >90)
GLUCOSE: 101 mg/dL — AB (ref 70–99)
Potassium: 4.1 mEq/L (ref 3.7–5.3)
Sodium: 142 mEq/L (ref 137–147)
TOTAL PROTEIN: 7.6 g/dL (ref 6.0–8.3)

## 2013-04-07 LAB — URINALYSIS, ROUTINE W REFLEX MICROSCOPIC
Glucose, UA: NEGATIVE mg/dL
Hgb urine dipstick: NEGATIVE
KETONES UR: 15 mg/dL — AB
Nitrite: POSITIVE — AB
PROTEIN: 30 mg/dL — AB
Specific Gravity, Urine: 1.036 — ABNORMAL HIGH (ref 1.005–1.030)
UROBILINOGEN UA: 1 mg/dL (ref 0.0–1.0)
pH: 5.5 (ref 5.0–8.0)

## 2013-04-07 LAB — LIPASE, BLOOD: Lipase: 25 U/L (ref 11–59)

## 2013-04-07 LAB — URINE MICROSCOPIC-ADD ON

## 2013-04-07 SURGERY — APPENDECTOMY, LAPAROSCOPIC
Anesthesia: General

## 2013-04-07 MED ORDER — MIDAZOLAM HCL 2 MG/2ML IJ SOLN
INTRAMUSCULAR | Status: AC
  Filled 2013-04-07: qty 2

## 2013-04-07 MED ORDER — ROCURONIUM BROMIDE 100 MG/10ML IV SOLN
INTRAVENOUS | Status: DC | PRN
  Administered 2013-04-07: 20 mg via INTRAVENOUS
  Administered 2013-04-07: 10 mg via INTRAVENOUS
  Administered 2013-04-07: 20 mg via INTRAVENOUS

## 2013-04-07 MED ORDER — LACTATED RINGERS IV SOLN: INTRAVENOUS | Status: DC | PRN

## 2013-04-07 MED ORDER — ALBUTEROL SULFATE (2.5 MG/3ML) 0.083% IN NEBU
INHALATION_SOLUTION | RESPIRATORY_TRACT | Status: AC
  Filled 2013-04-07: qty 3

## 2013-04-07 MED ORDER — SODIUM CHLORIDE 0.9 % IV SOLN
INTRAVENOUS | Status: DC | PRN
  Administered 2013-04-07: 16:00:00 via INTRAVENOUS

## 2013-04-07 MED ORDER — SODIUM CHLORIDE 0.9 % IR SOLN
Status: DC | PRN
  Administered 2013-04-07: 1000 mL

## 2013-04-07 MED ORDER — HYDROMORPHONE HCL PF 1 MG/ML IJ SOLN
0.2500 mg | INTRAMUSCULAR | Status: DC | PRN
  Administered 2013-04-07 (×2): 0.5 mg via INTRAVENOUS

## 2013-04-07 MED ORDER — LIDOCAINE HCL (CARDIAC) 20 MG/ML IV SOLN
INTRAVENOUS | Status: DC | PRN
  Administered 2013-04-07: 20 mg via INTRAVENOUS

## 2013-04-07 MED ORDER — SODIUM CHLORIDE 0.9 % IV SOLN
1.0000 g | Freq: Once | INTRAVENOUS | Status: AC
  Administered 2013-04-07: 1 g via INTRAVENOUS
  Filled 2013-04-07: qty 1

## 2013-04-07 MED ORDER — KCL IN DEXTROSE-NACL 20-5-0.45 MEQ/L-%-% IV SOLN
INTRAVENOUS | Status: DC
  Administered 2013-04-07: 1000 mL via INTRAVENOUS
  Administered 2013-04-09 – 2013-04-15 (×15): via INTRAVENOUS
  Filled 2013-04-07 (×27): qty 1000

## 2013-04-07 MED ORDER — AMLODIPINE BESYLATE 10 MG PO TABS: 10.0000 mg | ORAL_TABLET | Freq: Every day | ORAL | Status: DC

## 2013-04-07 MED ORDER — MEPERIDINE HCL 25 MG/ML IJ SOLN: 6.2500 mg | INTRAMUSCULAR | Status: DC | PRN

## 2013-04-07 MED ORDER — ALBUTEROL SULFATE (2.5 MG/3ML) 0.083% IN NEBU
2.5000 mg | INHALATION_SOLUTION | Freq: Once | RESPIRATORY_TRACT | Status: AC
  Administered 2013-04-07: 2.5 mg via RESPIRATORY_TRACT

## 2013-04-07 MED ORDER — GLYCOPYRROLATE 0.2 MG/ML IJ SOLN
INTRAMUSCULAR | Status: AC
  Filled 2013-04-07: qty 3

## 2013-04-07 MED ORDER — ONDANSETRON HCL 4 MG/2ML IJ SOLN
INTRAMUSCULAR | Status: DC | PRN
  Administered 2013-04-07: 4 mg via INTRAVENOUS

## 2013-04-07 MED ORDER — ONDANSETRON HCL 4 MG/2ML IJ SOLN
4.0000 mg | Freq: Four times a day (QID) | INTRAMUSCULAR | Status: DC | PRN
  Administered 2013-04-08: 4 mg via INTRAVENOUS
  Filled 2013-04-07: qty 2

## 2013-04-07 MED ORDER — LACTATED RINGERS IV SOLN
INTRAVENOUS | Status: DC | PRN
  Administered 2013-04-07 (×2): via INTRAVENOUS

## 2013-04-07 MED ORDER — HYDROMORPHONE HCL PF 1 MG/ML IJ SOLN
1.0000 mg | Freq: Once | INTRAMUSCULAR | Status: AC
  Administered 2013-04-07: 1 mg via INTRAVENOUS
  Filled 2013-04-07: qty 1

## 2013-04-07 MED ORDER — OXYCODONE HCL 5 MG PO TABS: 5.0000 mg | ORAL_TABLET | Freq: Once | ORAL | Status: DC | PRN

## 2013-04-07 MED ORDER — IOHEXOL 300 MG/ML  SOLN
100.0000 mL | Freq: Once | INTRAMUSCULAR | Status: AC | PRN
  Administered 2013-04-07: 100 mL via INTRAVENOUS

## 2013-04-07 MED ORDER — IOHEXOL 300 MG/ML  SOLN
20.0000 mL | INTRAMUSCULAR | Status: DC
  Administered 2013-04-07: 25 mL via ORAL

## 2013-04-07 MED ORDER — KCL IN DEXTROSE-NACL 20-5-0.45 MEQ/L-%-% IV SOLN
INTRAVENOUS | Status: AC
  Administered 2013-04-07: 1000 mL via INTRAVENOUS
  Filled 2013-04-07: qty 1000

## 2013-04-07 MED ORDER — SUCCINYLCHOLINE CHLORIDE 20 MG/ML IJ SOLN
INTRAMUSCULAR | Status: AC
  Filled 2013-04-07: qty 1

## 2013-04-07 MED ORDER — NALOXONE HCL 0.4 MG/ML IJ SOLN: 0.4000 mg | INTRAMUSCULAR | Status: DC | PRN

## 2013-04-07 MED ORDER — NEOSTIGMINE METHYLSULFATE 1 MG/ML IJ SOLN
INTRAMUSCULAR | Status: AC
  Filled 2013-04-07: qty 10

## 2013-04-07 MED ORDER — DEXAMETHASONE SODIUM PHOSPHATE 4 MG/ML IJ SOLN
INTRAMUSCULAR | Status: AC
  Filled 2013-04-07: qty 1

## 2013-04-07 MED ORDER — EPHEDRINE SULFATE 50 MG/ML IJ SOLN
INTRAMUSCULAR | Status: DC | PRN
  Administered 2013-04-07 (×2): 5 mg via INTRAVENOUS

## 2013-04-07 MED ORDER — HYDROMORPHONE HCL PF 1 MG/ML IJ SOLN
1.0000 mg | Freq: Once | INTRAMUSCULAR | Status: AC
Start: 2013-04-07 — End: 2013-04-07
  Administered 2013-04-07: 1 mg via INTRAVENOUS
  Filled 2013-04-07: qty 1

## 2013-04-07 MED ORDER — HYDROMORPHONE HCL PF 1 MG/ML IJ SOLN
INTRAMUSCULAR | Status: AC
  Filled 2013-04-07: qty 2

## 2013-04-07 MED ORDER — MORPHINE SULFATE (PF) 1 MG/ML IV SOLN
INTRAVENOUS | Status: AC
  Filled 2013-04-07: qty 25

## 2013-04-07 MED ORDER — ENOXAPARIN SODIUM 40 MG/0.4ML ~~LOC~~ SOLN
40.0000 mg | SUBCUTANEOUS | Status: DC
  Administered 2013-04-08 – 2013-04-17 (×10): 40 mg via SUBCUTANEOUS
  Filled 2013-04-07 (×10): qty 0.4

## 2013-04-07 MED ORDER — MIDAZOLAM HCL 2 MG/2ML IJ SOLN
0.5000 mg | Freq: Once | INTRAMUSCULAR | Status: AC | PRN
  Administered 2013-04-07: 2 mg via INTRAVENOUS

## 2013-04-07 MED ORDER — PROMETHAZINE HCL 25 MG/ML IJ SOLN: 6.2500 mg | INTRAMUSCULAR | Status: DC | PRN

## 2013-04-07 MED ORDER — SODIUM CHLORIDE 0.9 % IJ SOLN: 9.0000 mL | INTRAMUSCULAR | Status: DC | PRN

## 2013-04-07 MED ORDER — DIPHENHYDRAMINE HCL 12.5 MG/5ML PO ELIX
12.5000 mg | ORAL_SOLUTION | Freq: Four times a day (QID) | ORAL | Status: DC | PRN
  Filled 2013-04-07: qty 5

## 2013-04-07 MED ORDER — PROPOFOL 10 MG/ML IV BOLUS
INTRAVENOUS | Status: DC | PRN
  Administered 2013-04-07: 200 mg via INTRAVENOUS

## 2013-04-07 MED ORDER — FENTANYL CITRATE 0.05 MG/ML IJ SOLN
INTRAMUSCULAR | Status: AC
  Filled 2013-04-07: qty 5

## 2013-04-07 MED ORDER — DEXAMETHASONE SODIUM PHOSPHATE 4 MG/ML IJ SOLN
INTRAMUSCULAR | Status: DC | PRN
  Administered 2013-04-07: 4 mg via INTRAVENOUS

## 2013-04-07 MED ORDER — BUPIVACAINE-EPINEPHRINE (PF) 0.25% -1:200000 IJ SOLN
INTRAMUSCULAR | Status: AC
  Filled 2013-04-07: qty 30

## 2013-04-07 MED ORDER — SUCCINYLCHOLINE CHLORIDE 20 MG/ML IJ SOLN
INTRAMUSCULAR | Status: DC | PRN
  Administered 2013-04-07: 100 mg via INTRAVENOUS

## 2013-04-07 MED ORDER — PROPOFOL 10 MG/ML IV BOLUS
INTRAVENOUS | Status: AC
  Filled 2013-04-07: qty 20

## 2013-04-07 MED ORDER — LISINOPRIL 20 MG PO TABS: 40.0000 mg | ORAL_TABLET | Freq: Every day | ORAL | Status: DC

## 2013-04-07 MED ORDER — MIDAZOLAM HCL 5 MG/5ML IJ SOLN
INTRAMUSCULAR | Status: DC | PRN
  Administered 2013-04-07: 2 mg via INTRAVENOUS

## 2013-04-07 MED ORDER — LIDOCAINE HCL (CARDIAC) 20 MG/ML IV SOLN
INTRAVENOUS | Status: AC
  Filled 2013-04-07: qty 5

## 2013-04-07 MED ORDER — HYDROMORPHONE HCL PF 1 MG/ML IJ SOLN
INTRAMUSCULAR | Status: DC | PRN
  Administered 2013-04-07 (×2): 0.5 mg via INTRAVENOUS

## 2013-04-07 MED ORDER — FENTANYL CITRATE 0.05 MG/ML IJ SOLN
INTRAMUSCULAR | Status: DC | PRN
Start: 2013-04-07 — End: 2013-04-07
  Administered 2013-04-07: 100 ug via INTRAVENOUS
  Administered 2013-04-07 (×2): 50 ug via INTRAVENOUS
  Administered 2013-04-07: 100 ug via INTRAVENOUS
  Administered 2013-04-07 (×4): 50 ug via INTRAVENOUS

## 2013-04-07 MED ORDER — ROCURONIUM BROMIDE 50 MG/5ML IV SOLN
INTRAVENOUS | Status: AC
  Filled 2013-04-07: qty 1

## 2013-04-07 MED ORDER — PANTOPRAZOLE SODIUM 40 MG IV SOLR
40.0000 mg | INTRAVENOUS | Status: DC
  Administered 2013-04-07 – 2013-04-17 (×10): 40 mg via INTRAVENOUS
  Filled 2013-04-07 (×16): qty 40

## 2013-04-07 MED ORDER — ONDANSETRON HCL 4 MG/2ML IJ SOLN
4.0000 mg | Freq: Once | INTRAMUSCULAR | Status: AC
  Administered 2013-04-07: 4 mg via INTRAVENOUS
  Filled 2013-04-07: qty 2

## 2013-04-07 MED ORDER — STERILE WATER FOR INJECTION IJ SOLN
INTRAMUSCULAR | Status: AC
  Filled 2013-04-07: qty 10

## 2013-04-07 MED ORDER — EPHEDRINE SULFATE 50 MG/ML IJ SOLN
INTRAMUSCULAR | Status: AC
  Filled 2013-04-07: qty 1

## 2013-04-07 MED ORDER — GLYCOPYRROLATE 0.2 MG/ML IJ SOLN
INTRAMUSCULAR | Status: DC | PRN
  Administered 2013-04-07: 0.4 mg via INTRAVENOUS
  Administered 2013-04-07: 0.2 mg via INTRAVENOUS

## 2013-04-07 MED ORDER — MORPHINE SULFATE 2 MG/ML IJ SOLN: 1.0000 mg | INTRAMUSCULAR | Status: DC | PRN

## 2013-04-07 MED ORDER — ALBUMIN HUMAN 5 % IV SOLN
INTRAVENOUS | Status: DC | PRN
  Administered 2013-04-07: 18:00:00 via INTRAVENOUS

## 2013-04-07 MED ORDER — HEMOSTATIC AGENTS (NO CHARGE) OPTIME
TOPICAL | Status: DC | PRN
  Administered 2013-04-07: 1 via TOPICAL

## 2013-04-07 MED ORDER — MORPHINE SULFATE (PF) 1 MG/ML IV SOLN
INTRAVENOUS | Status: DC
  Administered 2013-04-07: 1.5 mg via INTRAVENOUS
  Administered 2013-04-07: 22.06 mg via INTRAVENOUS
  Administered 2013-04-08: 06:00:00 via INTRAVENOUS
  Administered 2013-04-08: 13.5 mg via INTRAVENOUS
  Administered 2013-04-08: 13.1 mg via INTRAVENOUS
  Administered 2013-04-08 (×2): 9 mg via INTRAVENOUS
  Administered 2013-04-08: 20.97 mg via INTRAVENOUS
  Administered 2013-04-08 – 2013-04-09 (×2): via INTRAVENOUS
  Administered 2013-04-09: 18.8 mg via INTRAVENOUS
  Administered 2013-04-09 (×2): via INTRAVENOUS
  Administered 2013-04-09: 4.5 mg via INTRAVENOUS
  Administered 2013-04-09: 3 mg via INTRAVENOUS
  Administered 2013-04-10 (×2): via INTRAVENOUS
  Administered 2013-04-10: 10.5 mg via INTRAVENOUS
  Administered 2013-04-10: 7.37 mg via INTRAVENOUS
  Administered 2013-04-10: 12 mg via INTRAVENOUS
  Administered 2013-04-10: 18:00:00 via INTRAVENOUS
  Administered 2013-04-11: 16.99 mg via INTRAVENOUS
  Administered 2013-04-11: 9 mg via INTRAVENOUS
  Administered 2013-04-11: 11.6 mg via INTRAVENOUS
  Administered 2013-04-11: 6.88 mg via INTRAVENOUS
  Filled 2013-04-07 (×10): qty 25

## 2013-04-07 MED ORDER — DIPHENHYDRAMINE HCL 50 MG/ML IJ SOLN: 12.5000 mg | Freq: Four times a day (QID) | INTRAMUSCULAR | Status: DC | PRN

## 2013-04-07 MED ORDER — ONDANSETRON HCL 4 MG/2ML IJ SOLN: 4.0000 mg | INTRAMUSCULAR | Status: DC | PRN

## 2013-04-07 MED ORDER — NEOSTIGMINE METHYLSULFATE 1 MG/ML IJ SOLN
INTRAMUSCULAR | Status: DC | PRN
  Administered 2013-04-07: 3 mg via INTRAVENOUS

## 2013-04-07 MED ORDER — BUPIVACAINE-EPINEPHRINE 0.25% -1:200000 IJ SOLN
INTRAMUSCULAR | Status: DC | PRN
  Administered 2013-04-07: 7 mL

## 2013-04-07 MED ORDER — OXYCODONE HCL 5 MG/5ML PO SOLN: 5.0000 mg | Freq: Once | ORAL | Status: DC | PRN

## 2013-04-07 MED ORDER — ONDANSETRON HCL 4 MG/2ML IJ SOLN
INTRAMUSCULAR | Status: AC
  Filled 2013-04-07: qty 2

## 2013-04-07 SURGICAL SUPPLY — 73 items
ADH SKN CLS APL DERMABOND .7 (GAUZE/BANDAGES/DRESSINGS) ×2
APL SKNCLS STERI-STRIP NONHPOA (GAUZE/BANDAGES/DRESSINGS)
APPLIER CLIP ROT 10 11.4 M/L (STAPLE)
APR CLP MED LRG 11.4X10 (STAPLE)
BAG SPEC RTRVL LRG 6X4 10 (ENDOMECHANICALS) ×4
BENZOIN TINCTURE PRP APPL 2/3 (GAUZE/BANDAGES/DRESSINGS) IMPLANT
BLADE SURG ROTATE 9660 (MISCELLANEOUS) IMPLANT
BNDG GAUZE ELAST 4 BULKY (GAUZE/BANDAGES/DRESSINGS) ×2 IMPLANT
CANISTER SUCTION 2500CC (MISCELLANEOUS) ×4 IMPLANT
CHLORAPREP W/TINT 26ML (MISCELLANEOUS) ×4 IMPLANT
CLIP APPLIE ROT 10 11.4 M/L (STAPLE) IMPLANT
COVER SURGICAL LIGHT HANDLE (MISCELLANEOUS) ×4 IMPLANT
CUTTER FLEX LINEAR 45M (STAPLE) ×4 IMPLANT
DECANTER SPIKE VIAL GLASS SM (MISCELLANEOUS) ×4 IMPLANT
DERMABOND ADVANCED (GAUZE/BANDAGES/DRESSINGS) ×2
DERMABOND ADVANCED .7 DNX12 (GAUZE/BANDAGES/DRESSINGS) IMPLANT
DRAPE UTILITY 15X26 W/TAPE STR (DRAPE) ×8 IMPLANT
DRSG PAD ABDOMINAL 8X10 ST (GAUZE/BANDAGES/DRESSINGS) ×2 IMPLANT
ELECT BLADE 4.0 EZ CLEAN MEGAD (MISCELLANEOUS) ×4
ELECT CAUTERY BLADE 6.4 (BLADE) ×2 IMPLANT
ELECT REM PT RETURN 9FT ADLT (ELECTROSURGICAL) ×4
ELECTRODE BLDE 4.0 EZ CLN MEGD (MISCELLANEOUS) IMPLANT
ELECTRODE REM PT RTRN 9FT ADLT (ELECTROSURGICAL) ×2 IMPLANT
ENDOLOOP SUT PDS II  0 18 (SUTURE)
ENDOLOOP SUT PDS II 0 18 (SUTURE) IMPLANT
GAUZE SPONGE 2X2 8PLY STRL LF (GAUZE/BANDAGES/DRESSINGS) IMPLANT
GLOVE BIO SURGEON STRL SZ 6 (GLOVE) ×6 IMPLANT
GLOVE BIOGEL M STRL SZ7.5 (GLOVE) ×6 IMPLANT
GLOVE BIOGEL PI IND STRL 8 (GLOVE) ×2 IMPLANT
GLOVE BIOGEL PI INDICATOR 8 (GLOVE) ×2
GLOVE SURG SS PI 7.0 STRL IVOR (GLOVE) ×4 IMPLANT
GOWN STRL NON-REIN LRG LVL3 (GOWN DISPOSABLE) ×8 IMPLANT
GOWN STRL REIN XL XLG (GOWN DISPOSABLE) ×4 IMPLANT
HEMOSTAT SNOW SURGICEL 2X4 (HEMOSTASIS) ×2 IMPLANT
KIT BASIN OR (CUSTOM PROCEDURE TRAY) ×4 IMPLANT
KIT ROOM TURNOVER OR (KITS) ×4 IMPLANT
LIGASURE IMPACT 36 18CM CVD LR (INSTRUMENTS) ×2 IMPLANT
NS IRRIG 1000ML POUR BTL (IV SOLUTION) ×4 IMPLANT
PAD ARMBOARD 7.5X6 YLW CONV (MISCELLANEOUS) ×8 IMPLANT
PENCIL BUTTON HOLSTER BLD 10FT (ELECTRODE) ×2 IMPLANT
POUCH SPECIMEN RETRIEVAL 10MM (ENDOMECHANICALS) ×6 IMPLANT
RELOAD 45 VASCULAR/THIN (ENDOMECHANICALS) IMPLANT
RELOAD PROXIMATE 75MM BLUE (ENDOMECHANICALS) ×16 IMPLANT
RELOAD STAPLE 45 2.5 WHT GRN (ENDOMECHANICALS) IMPLANT
RELOAD STAPLE 45 3.5 BLU ETS (ENDOMECHANICALS) IMPLANT
RELOAD STAPLE 75 3.8 BLU REG (ENDOMECHANICALS) IMPLANT
RELOAD STAPLE TA45 3.5 REG BLU (ENDOMECHANICALS) IMPLANT
SCALPEL HARMONIC ACE (MISCELLANEOUS) ×4 IMPLANT
SCISSORS LAP 5X35 DISP (ENDOMECHANICALS) ×2 IMPLANT
SET IRRIG TUBING LAPAROSCOPIC (IRRIGATION / IRRIGATOR) ×4 IMPLANT
SPECIMEN JAR SMALL (MISCELLANEOUS) ×4 IMPLANT
SPONGE GAUZE 2X2 STER 10/PKG (GAUZE/BANDAGES/DRESSINGS)
SPONGE LAP 18X18 X RAY DECT (DISPOSABLE) ×6 IMPLANT
STAPLER GUN LINEAR PROX 60 (STAPLE) ×2 IMPLANT
STAPLER PROXIMATE 75MM BLUE (STAPLE) ×2 IMPLANT
SUCTION POOLE TIP (SUCTIONS) ×2 IMPLANT
SUT MNCRL AB 4-0 PS2 18 (SUTURE) ×4 IMPLANT
SUT PDS AB 1 TP1 96 (SUTURE) ×4 IMPLANT
SUT SILK 2 0 SH (SUTURE) ×4 IMPLANT
SUT SILK 2 0SH CR/8 30 (SUTURE) ×2 IMPLANT
SUT SILK 3 0SH CR/8 30 (SUTURE) ×2 IMPLANT
SUT VICRYL 0 UR6 27IN ABS (SUTURE) IMPLANT
TAPE CLOTH SURG 4X10 WHT LF (GAUZE/BANDAGES/DRESSINGS) ×2 IMPLANT
TOWEL OR 17X24 6PK STRL BLUE (TOWEL DISPOSABLE) ×4 IMPLANT
TOWEL OR 17X26 10 PK STRL BLUE (TOWEL DISPOSABLE) ×4 IMPLANT
TRAY FOLEY CATH 16FR SILVER (SET/KITS/TRAYS/PACK) ×4 IMPLANT
TRAY LAPAROSCOPIC (CUSTOM PROCEDURE TRAY) ×4 IMPLANT
TROCAR XCEL BLADELESS 5X75MML (TROCAR) ×8 IMPLANT
TROCAR XCEL BLUNT TIP 100MML (ENDOMECHANICALS) ×4 IMPLANT
TROCAR XCEL NON-BLD 5MMX100MML (ENDOMECHANICALS) ×4 IMPLANT
TUBE CONNECTING 12'X1/4 (SUCTIONS) ×1
TUBE CONNECTING 12X1/4 (SUCTIONS) ×1 IMPLANT
YANKAUER SUCT BULB TIP NO VENT (SUCTIONS) ×2 IMPLANT

## 2013-04-07 NOTE — ED Notes (Signed)
General surgery PA at bedside 

## 2013-04-07 NOTE — ED Notes (Signed)
Per pt sts lower generalized abdominal pain since Saturday. sts some nausea and vomiting. Cold  Chills. Denies diarrhea. sts sts some fever.

## 2013-04-07 NOTE — Transfer of Care (Signed)
Immediate Anesthesia Transfer of Care Note  Patient: Ernest Hudson  Procedure(s) Performed: Procedure(s): ATTEMPTED APPENDECTOMY LAPAROSCOPIC (N/A) OPEN APPENDECTOMY PARTIAL COLECTOMY, ILEOCECTOMY  Patient Location: PACU  Anesthesia Type:General  Level of Consciousness: awake  Airway & Oxygen Therapy: Patient Spontanous Breathing and Patient connected to face mask oxygen  Post-op Assessment: Report given to PACU RN and Post -op Vital signs reviewed and stable  Post vital signs: Reviewed and stable  Complications: No apparent anesthesia complications

## 2013-04-07 NOTE — H&P (Signed)
Ernest Hudson 06/23/1955  [id number].   Primary Care MD: Dr. Bluford Kaufmann Chief Complaint/Reason for Consult: perforated appendicitis HPI: [name] with a h/o HTN and Birt-Hogg Dube syndrome who began having diffuse abdominal pain around noon on Saturday.  It worsened that night with a subjective fever.  It continued into Sunday with N/V.  Due to persistent pain, he presented to the [facility] today for further evaluation.  He was found to have acute perforated appendicitis on a CT scan.  His WBC was 15.8K.  He denies any other issues.  ROS: Please see HPI, otherwise all other systems are currently negative  Family History  Problem Relation Age of Onset  . Cancer Mother     breast  . Diabetes Mother   . Heart attack Father   . Diabetes Father   . Cancer Father     prostate   . Healthy Sister   . Lung disease Sister     has blebs on MRI,   . Healthy Brother   . Healthy Brother   . Breast cancer Maternal Aunt     diagnosed in her 87s  . Melanoma Maternal Uncle   . Breast cancer Maternal Aunt     diagnosed in her 18s  . Breast cancer Maternal Aunt     diagnosed in her 55s  . Cancer Cousin     multiple myeloma    Past Medical History  Diagnosis Date  . Allergic rhinitis   . Hypertension   . Hx of colonic polyps   . Hyperlipidemia   . Bradycardia   . Dyslipidemia (high LDL; low HDL)   . Pneumothorax 1995  . Cancer   . Birt-Hogg-Dube syndrome     Past Surgical History  Procedure Laterality Date  . Sponstaneous pneumothorax  10/05  . Cholecystectomy  2004  . External ear surgery  1971  . Colonoscopy  5-07  . Cardiolyte stress  3/10  . Thumb surgery Left   . Elbow surgery Left   . Knee arthroscopy Left     Social History:  reports that he quit smoking about 8 months ago. His smoking use included Cigarettes. He smoked 1.00 pack per day. He has never used smokeless tobacco. He reports that he does not drink alcohol or use illicit drugs.  Allergies:   Allergies  Allergen Reactions  . Niacin     REACTION: rash     (Not in a hospital admission)  Blood pressure 106/66, pulse 84, temperature 98 F (36.7 C), temperature source Oral, resp. rate 15, SpO2 94.00%. Physical Exam: General: pleasant, WD, WN white male who is laying in bed in NAD HEENT: head is normocephalic, atraumatic.  Sclera are noninjected.  PERRL.  Ears and nose without any masses or lesions.  Mouth is pink and moist Heart: regular, rate, and rhythm.  Normal s1,s2. No obvious murmurs, gallops, or rubs noted.  Palpable radial and pedal pulses bilaterally Lungs: CTAB, no wheezes, rhonchi, or rales noted.  Respiratory effort nonlabored Abd: soft, tender in RLQ at McBurney's point, ND, +BS, no masses, hernias, or organomegaly MS: all 4 extremities are symmetrical with no cyanosis, clubbing, or edema. Skin: warm and dry with no masses, lesions, or rashes Psych: A&Ox3 with an appropriate affect.    Results for orders placed during the hospital encounter of 04/07/13 (from the past 48 hour(s))  CBC WITH DIFFERENTIAL     Status: Abnormal   Collection Time    04/07/13 11:22 AM  Result Value Ref Range   WBC 15.2 (*) 4.0 - 10.5 K/uL   RBC 5.09  4.22 - 5.81 MIL/uL   Hemoglobin 16.1  13.0 - 17.0 g/dL   HCT 44.4  39.0 - 52.0 %   MCV 87.2  78.0 - 100.0 fL   MCH 31.6  26.0 - 34.0 pg   MCHC 36.3 (*) 30.0 - 36.0 g/dL   RDW 12.7  11.5 - 15.5 %   Platelets 233  150 - 400 K/uL   Neutrophils Relative % 77  43 - 77 %   Neutro Abs 11.7 (*) 1.7 - 7.7 K/uL   Lymphocytes Relative 15  12 - 46 %   Lymphs Abs 2.2  0.7 - 4.0 K/uL   Monocytes Relative 8  3 - 12 %   Monocytes Absolute 1.2 (*) 0.1 - 1.0 K/uL   Eosinophils Relative 0  0 - 5 %   Eosinophils Absolute 0.0  0.0 - 0.7 K/uL   Basophils Relative 0  0 - 1 %   Basophils Absolute 0.0  0.0 - 0.1 K/uL  COMPREHENSIVE METABOLIC PANEL     Status: Abnormal   Collection Time    04/07/13 11:22 AM      Result Value Ref Range   Sodium  142  137 - 147 mEq/L   Potassium 4.1  3.7 - 5.3 mEq/L   Chloride 101  96 - 112 mEq/L   CO2 27  19 - 32 mEq/L   Glucose, Bld 101 (*) 70 - 99 mg/dL   BUN 15  6 - 23 mg/dL   Creatinine, Ser 1.38 (*) 0.50 - 1.35 mg/dL   Calcium 9.4  8.4 - 10.5 mg/dL   Total Protein 7.6  6.0 - 8.3 g/dL   Albumin 3.8  3.5 - 5.2 g/dL   AST 17  0 - 37 U/L   ALT 25  0 - 53 U/L   Alkaline Phosphatase 88  39 - 117 U/L   Total Bilirubin 0.9  0.3 - 1.2 mg/dL   GFR calc non Af Amer 55 (*) >90 mL/min   GFR calc Af Amer 64 (*) >90 mL/min   Comment: (NOTE)     The eGFR has been calculated using the CKD EPI equation.     This calculation has not been validated in all clinical situations.     eGFR's persistently <90 mL/min signify possible Chronic Kidney     Disease.  LIPASE, BLOOD     Status: None   Collection Time    04/07/13 11:22 AM      Result Value Ref Range   Lipase 25  11 - 59 U/L  URINALYSIS, ROUTINE W REFLEX MICROSCOPIC     Status: Abnormal   Collection Time    04/07/13 11:24 AM      Result Value Ref Range   Color, Urine ORANGE (*) YELLOW   Comment: BIOCHEMICALS MAY BE AFFECTED BY COLOR   APPearance CLOUDY (*) CLEAR   Specific Gravity, Urine 1.036 (*) 1.005 - 1.030   pH 5.5  5.0 - 8.0   Glucose, UA NEGATIVE  NEGATIVE mg/dL   Hgb urine dipstick NEGATIVE  NEGATIVE   Bilirubin Urine SMALL (*) NEGATIVE   Ketones, ur 15 (*) NEGATIVE mg/dL   Protein, ur 30 (*) NEGATIVE mg/dL   Urobilinogen, UA 1.0  0.0 - 1.0 mg/dL   Nitrite POSITIVE (*) NEGATIVE   Leukocytes, UA TRACE (*) NEGATIVE  URINE MICROSCOPIC-ADD ON     Status: Abnormal   Collection  Time    04/07/13 11:24 AM      Result Value Ref Range   Squamous Epithelial / LPF RARE  RARE   WBC, UA 0-2  <3 WBC/hpf   Bacteria, UA MANY (*) RARE   Casts HYALINE CASTS (*) NEGATIVE   Urine-Other MUCOUS PRESENT     Ct Abdomen Pelvis W Contrast  04/07/2013   CLINICAL DATA:  Generalized lower abdominal pain, nausea, vomiting  EXAM: CT ABDOMEN AND PELVIS WITH  CONTRAST  TECHNIQUE: Multidetector CT imaging of the abdomen and pelvis was performed using the standard protocol following bolus administration of intravenous contrast.  CONTRAST:  176m OMNIPAQUE IOHEXOL 300 MG/ML  SOLN  COMPARISON:  11/30/2010, 12/27/2010  FINDINGS: Scattered bibasilar bullous disease and blebs. Minor basilar atelectasis versus scarring. Appearance is similar to the prior study. Normal heart size. No pericardial or pleural effusion. Small hiatal hernia noted. Atherosclerotic changes of the lower thoracic aorta.  Abdomen: 2.9 cm left adrenal round mass noted, stable in size correlating with a left adrenal adenoma by comparison MRI.  Prior cholecystectomy noted. Liver, biliary system, pancreas, spleen, and kidneys are within normal limits for age and demonstrate no acute finding. Stable small hyperdense cyst in the left kidney lower pole medially measures 10 mm, image 39.  Atherosclerotic changes of the aorta without aneurysm or occlusion.  no abdominal fluid collection, hemorrhage, abscess, or adenopathy.  Negative for bowel obstruction, dilatation, or ileus.  In the right lower quadrant, strandy edema and inflammation is noted about the cecum. This extends along the right hemipelvis. Small gas bubbles noted in the adjacent pericecal fat. Appendix appears abnormal, images 60 through 65 with evidence of distention and wall irregularity. Appendicolith noted at the appendix orifice, image 60. Findings compatible with acute appendicitis with a localized contained rupture.  Pelvis: Trace pelvic free fluid. Negative for abscess, hemorrhage, adenopathy, inguinal abnormality, or hernia. Urinary bladder is collapsed.  Minor degenerative changes of the spine.  No acute osseous finding.  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 Dr. WChristy Gentles who verbally acknowledged these results.   Electronically Signed   By: TDaryll BrodM.D.   On: 04/07/2013 14:29       Assessment/Plan 1. Acute perforated appendicitis 2. UTI 3. HTN 4. BHD syndrome  Plan: 1. The patient will be admitted and taken to the OR for removal of his appendix.  He will be started on Invanz.  The procedure including risks, complications, benefits, and expected outcome have been d/w the patient and his wife.  They both understand and are agreeable to proceed with surgery.    Davide Risdon E 04/07/2013, 3:32 PM Pager: [phone]

## 2013-04-07 NOTE — ED Provider Notes (Signed)
Pt noted to have acute appendicitis I d/w surgery and they will admit patient IV antibiotics have been ordered Pt updated on plan Patient Stabilized in the ER  Sharyon Cable, MD 04/07/13 1424

## 2013-04-07 NOTE — ED Provider Notes (Signed)
CSN: [id number]     Arrival date & time 04/07/13  1023 History   First MD Initiated Contact with Patient 04/07/13 1155     Chief Complaint  Patient presents with  . Abdominal Pain     (Consider location/radiation/quality/duration/timing/severity/associated sxs/prior Treatment) HPI Comments: Patient presents emergency department with chief complaint of right lower quadrant pain. He endorses associated subjective fevers and chills. He states that the pain is worse in the right lower quadrant. He states that currently the pain is 7/10, but does go as high as 10 out of 10. States pain has been worsening since that her today. He reports associated nausea and vomiting, but denies any hematemesis or melena. Denies any diarrhea. Past surgical history includes cholecystectomy. No dysuria, or flank pain.  The history is provided by the patient. No language interpreter was used.    Past Medical History  Diagnosis Date  . Allergic rhinitis   . Hypertension   . Hx of colonic polyps   . Hyperlipidemia   . Bradycardia   . Dyslipidemia (high LDL; low HDL)   . Pneumothorax 1995  . Cancer    Past Surgical History  Procedure Laterality Date  . Sponstaneous pneumothorax  10/05  . Cholecystectomy  2004  . External ear surgery  1971  . Colonoscopy  5-07  . Cardiolyte stress  3/10   Family History  Problem Relation Age of Onset  . Cancer Mother     breast  . Diabetes Mother   . Heart attack Father   . Diabetes Father   . Cancer Father     prostate   . Healthy Sister   . Lung disease Sister     has blebs on MRI,   . Healthy Brother   . Healthy Brother   . Breast cancer Maternal Aunt     diagnosed in her 2s  . Melanoma Maternal Uncle   . Breast cancer Maternal Aunt     diagnosed in her 89s  . Breast cancer Maternal Aunt     diagnosed in her 80s  . Cancer Cousin     multiple myeloma   History  Substance Use Topics  . Smoking status: Former Smoker -- 1.00 packs/day    Types:  Cigarettes    Quit date: 07/30/2012  . Smokeless tobacco: Never Used  . Alcohol Use: No    Review of Systems  All other systems reviewed and are negative.      Allergies  Niacin  Home Medications   Current Outpatient Rx  Name  Route  Sig  Dispense  Refill  . amLODipine (NORVASC) 10 MG tablet   Oral   Take 1 tablet (10 mg total) by mouth daily.   90 tablet   1   . aspirin EC 81 MG tablet   Oral   Take 81 mg by mouth daily.         Marland Kitchen CALCIUM PO   Oral   Take 1 tablet by mouth daily.         . cetirizine (ZYRTEC) 10 MG tablet   Oral   Take 10 mg by mouth daily.           . fluticasone (FLONASE) 50 MCG/ACT nasal spray   Nasal   Place 2 sprays into the nose daily as needed. For allergies.   16 g   6   . ibuprofen (ADVIL,MOTRIN) 200 MG tablet   Oral   Take 600 mg by mouth every 6 (six) hours as needed  for pain.         Marland Kitchen ibuprofen (ADVIL,MOTRIN) 600 MG tablet   Oral   Take 1 tablet (600 mg total) by mouth every 6 (six) hours as needed for pain.   30 tablet   0   . lisinopril (PRINIVIL,ZESTRIL) 40 MG tablet   Oral   Take 40 mg by mouth daily.         . magnesium oxide (MAG-OX) 400 MG tablet   Oral   Take 400 mg by mouth daily.         . methocarbamol (ROBAXIN-750) 750 MG tablet   Oral   Take 1 tablet (750 mg total) by mouth 4 (four) times daily.   30 tablet   0   . Multiple Vitamin (MULTIVITAMIN) tablet   Oral   Take 1 tablet by mouth daily.          Marland Kitchen PRESCRIPTION MEDICATION   Topical   Apply 1 application topically daily as needed (for breakouts on face).          BP 131/74  Pulse 89  Temp(Src) 98 F (36.7 C) (Oral)  Resp 15  SpO2 98% Physical Exam  Nursing note and vitals reviewed. Constitutional: He is oriented to person, place, and time. He appears well-developed and well-nourished.  HENT:  Head: Normocephalic and atraumatic.  Eyes: Conjunctivae and EOM are normal. Pupils are equal, round, and reactive to light.  Right eye exhibits no discharge. Left eye exhibits no discharge. No scleral icterus.  Neck: Normal range of motion. Neck supple. No JVD present.  Cardiovascular: Normal rate, regular rhythm and normal heart sounds.  Exam reveals no gallop and no friction rub.   No murmur heard. Pulmonary/Chest: Effort normal and breath sounds normal. No respiratory distress. He has no wheezes. He has no rales. He exhibits no tenderness.  Abdominal: Soft. He exhibits no distension and no mass. There is no tenderness. There is no rebound and no guarding.  Abdomen is very tender to palpation in the right lower quadrant, positive for guarding, no rebound, no masses, no fluid wave, or signs of peritonitis  Musculoskeletal: Normal range of motion. He exhibits no edema and no tenderness.  Neurological: He is alert and oriented to person, place, and time.  Skin: Skin is warm and dry.  Psychiatric: He has a normal mood and affect. His behavior is normal. Judgment and thought content normal.    ED Course  Procedures (including critical care time) Results for orders placed during the hospital encounter of 04/07/13  CBC WITH DIFFERENTIAL      Result Value Ref Range   WBC 15.2 (*) 4.0 - 10.5 K/uL   RBC 5.09  4.22 - 5.81 MIL/uL   Hemoglobin 16.1  13.0 - 17.0 g/dL   HCT 44.4  39.0 - 52.0 %   MCV 87.2  78.0 - 100.0 fL   MCH 31.6  26.0 - 34.0 pg   MCHC 36.3 (*) 30.0 - 36.0 g/dL   RDW 12.7  11.5 - 15.5 %   Platelets 233  150 - 400 K/uL   Neutrophils Relative % 77  43 - 77 %   Neutro Abs 11.7 (*) 1.7 - 7.7 K/uL   Lymphocytes Relative 15  12 - 46 %   Lymphs Abs 2.2  0.7 - 4.0 K/uL   Monocytes Relative 8  3 - 12 %   Monocytes Absolute 1.2 (*) 0.1 - 1.0 K/uL   Eosinophils Relative 0  0 - 5 %  Eosinophils Absolute 0.0  0.0 - 0.7 K/uL   Basophils Relative 0  0 - 1 %   Basophils Absolute 0.0  0.0 - 0.1 K/uL  COMPREHENSIVE METABOLIC PANEL      Result Value Ref Range   Sodium 142  137 - 147 mEq/L   Potassium 4.1  3.7 -  5.3 mEq/L   Chloride 101  96 - 112 mEq/L   CO2 27  19 - 32 mEq/L   Glucose, Bld 101 (*) 70 - 99 mg/dL   BUN 15  6 - 23 mg/dL   Creatinine, Ser 1.38 (*) 0.50 - 1.35 mg/dL   Calcium 9.4  8.4 - 10.5 mg/dL   Total Protein 7.6  6.0 - 8.3 g/dL   Albumin 3.8  3.5 - 5.2 g/dL   AST 17  0 - 37 U/L   ALT 25  0 - 53 U/L   Alkaline Phosphatase 88  39 - 117 U/L   Total Bilirubin 0.9  0.3 - 1.2 mg/dL   GFR calc non Af Amer 55 (*) >90 mL/min   GFR calc Af Amer 64 (*) >90 mL/min  URINALYSIS, ROUTINE W REFLEX MICROSCOPIC      Result Value Ref Range   Color, Urine ORANGE (*) YELLOW   APPearance CLOUDY (*) CLEAR   Specific Gravity, Urine 1.036 (*) 1.005 - 1.030   pH 5.5  5.0 - 8.0   Glucose, UA NEGATIVE  NEGATIVE mg/dL   Hgb urine dipstick NEGATIVE  NEGATIVE   Bilirubin Urine SMALL (*) NEGATIVE   Ketones, ur 15 (*) NEGATIVE mg/dL   Protein, ur 30 (*) NEGATIVE mg/dL   Urobilinogen, UA 1.0  0.0 - 1.0 mg/dL   Nitrite POSITIVE (*) NEGATIVE   Leukocytes, UA TRACE (*) NEGATIVE  LIPASE, BLOOD      Result Value Ref Range   Lipase 25  11 - 59 U/L  URINE MICROSCOPIC-ADD ON      Result Value Ref Range   Squamous Epithelial / LPF RARE  RARE   WBC, UA 0-2  <3 WBC/hpf   Bacteria, UA MANY (*) RARE   Casts HYALINE CASTS (*) NEGATIVE   Urine-Other MUCOUS PRESENT     Ct Abdomen Pelvis W Contrast  04/07/2013   CLINICAL DATA:  Generalized lower abdominal pain, nausea, vomiting  EXAM: CT ABDOMEN AND PELVIS WITH CONTRAST  TECHNIQUE: Multidetector CT imaging of the abdomen and pelvis was performed using the standard protocol following bolus administration of intravenous contrast.  CONTRAST:  125m OMNIPAQUE IOHEXOL 300 MG/ML  SOLN  COMPARISON:  11/30/2010, 12/27/2010  FINDINGS: Scattered bibasilar bullous disease and blebs. Minor basilar atelectasis versus scarring. Appearance is similar to the prior study. Normal heart size. No pericardial or pleural effusion. Small hiatal hernia noted. Atherosclerotic changes of the  lower thoracic aorta.  Abdomen: 2.9 cm left adrenal round mass noted, stable in size correlating with a left adrenal adenoma by comparison MRI.  Prior cholecystectomy noted. Liver, biliary system, pancreas, spleen, and kidneys are within normal limits for age and demonstrate no acute finding. Stable small hyperdense cyst in the left kidney lower pole medially measures 10 mm, image 39.  Atherosclerotic changes of the aorta without aneurysm or occlusion.  no abdominal fluid collection, hemorrhage, abscess, or adenopathy.  Negative for bowel obstruction, dilatation, or ileus.  In the right lower quadrant, strandy edema and inflammation is noted about the cecum. This extends along the right hemipelvis. Small gas bubbles noted in the adjacent pericecal fat. Appendix appears abnormal, images 60  through 32 with evidence of distention and wall irregularity. Appendicolith noted at the appendix orifice, image 60. Findings compatible with acute appendicitis with a localized contained rupture.  Pelvis: Trace pelvic free fluid. Negative for abscess, hemorrhage, adenopathy, inguinal abnormality, or hernia. Urinary bladder is collapsed.  Minor degenerative changes of the spine.  No acute osseous finding.  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 Dr. Christy Gentles, who verbally acknowledged these results.   Electronically Signed   By: Daryll Brod M.D.   On: 04/07/2013 14:29      EKG Interpretation   None       MDM   Final diagnoses:  Acute appendicitis    Patient with RLQ pain.  Subjective fever.  Check labs, manage pain, CT abd.  12:41 PM Patient states the pain is improved. Urine is nitrate positive. However will proceed with CT scan, as right lower  quadrant pain is persistent. He denies any dysuria.  3:12 PM CT shows acute ruptured appy.  Patient to be admitted by surgery.  Dr. Christy Gentles discussed the patient with surgery, who will admit.  Medications  iohexol (OMNIPAQUE) 300 MG/ML solution 20 mL (25 mLs Oral Contrast Given 04/07/13 1315)  ertapenem (INVANZ) 1 g in sodium chloride 0.9 % 50 mL IVPB (not administered)  HYDROmorphone (DILAUDID) injection 1 mg (1 mg Intravenous Given 04/07/13 1223)  ondansetron (ZOFRAN) injection 4 mg (4 mg Intravenous Given 04/07/13 1222)  HYDROmorphone (DILAUDID) injection 1 mg (1 mg Intravenous Given 04/07/13 1324)  iohexol (OMNIPAQUE) 300 MG/ML solution 100 mL (100 mLs Intravenous Contrast Given 04/07/13 1359)       Montine Circle, PA-C 04/07/13 1513

## 2013-04-07 NOTE — Preoperative (Signed)
Beta Blockers   Reason not to administer Beta Blockers:Not Applicable 

## 2013-04-07 NOTE — H&P (Signed)
I saw the patient, participated in the history, exam and medical decision making, and concur with the physician assistant's note above.  Alert, nad, nontoxic cta Reg Soft, RLQ guarding/localized peritonitis  Ruptured appendicitis HTN UTI BHD syndrome  We discussed the etiology and management of acute appendicitis. We discussed operative and nonoperative management. Since there is no definitive abscess I did not recommend observation with IV antibiotic  I recommended operative management along with IV antibiotics.  We discussed laparoscopic appendectomy. We discussed the risk and benefits of surgery including but not limited to bleeding, infection, injury to surrounding structures, need to convert to an open procedure, blood clot formation, post operative abscess or wound infection, staple line complications such as leak or bleeding, bowel resection, hernia formation, post operative ileus, need for additional procedures, anesthesia complications, and the typical postoperative course. I explained that the patient should expect a good improvement in their symptoms.  [name], MD, FACS General, Bariatric, & Minimally Invasive Surgery [facility], [place]

## 2013-04-07 NOTE — ED Notes (Signed)
Notified pharmacy to please send abx to OR since it has not arrived in the ED.

## 2013-04-07 NOTE — ED Notes (Signed)
Consents signed. Report called. Will transport pt to holding area

## 2013-04-07 NOTE — ED Notes (Signed)
Received call that OR is ready for pt. Will call report to receiving RN.

## 2013-04-07 NOTE — Brief Op Note (Signed)
04/07/2013  7:01 PM  PATIENT:  Ernest Hudson  58 y.o. male  PRE-OPERATIVE DIAGNOSIS:  Ruptured Appendix  POST-OPERATIVE DIAGNOSIS:  Ruptured Appendix  PROCEDURE:  Procedure(s): ATTEMPTED APPENDECTOMY LAPAROSCOPIC (N/A) OPEN APPENDECTOMY PARTIAL COLECTOMY, ILEOCECTOMY  SURGEON:  Surgeon(s) and Role:    * Gayland Curry, MD - Primary    * Gwenyth Ober, MD - Assisting  PHYSICIAN ASSISTANT: none  ASSISTANTS: see above   ANESTHESIA:   general  EBL:     BLOOD ADMINISTERED:none  DRAINS: Nasogastric Tube and Urinary Catheter (Foley)   LOCAL MEDICATIONS USED:  MARCAINE     SPECIMEN:  Source of Specimen:  TI and Cecum; additional ascending colon; appendix  DISPOSITION OF SPECIMEN:  PATHOLOGY  COUNTS:  YES  TOURNIQUET:  * No tourniquets in log *  DICTATION: .Other Dictation: Dictation Number [id number]  PLAN OF CARE: Admit to inpatient   PATIENT DISPOSITION:  PACU - hemodynamically stable.   Delay start of Pharmacological VTE agent (>24hrs) due to surgical blood loss or risk of bleeding: no  Ernest Hudson. [name], MD, FACS General, Bariatric, & Minimally Invasive Hudson Ernest Hudson, Ernest Hudson

## 2013-04-07 NOTE — Anesthesia Preprocedure Evaluation (Addendum)
Anesthesia Evaluation  Patient identified by MRN, date of birth, ID band Patient awake    Reviewed: Allergy & Precautions, H&P , NPO status , Patient's Chart, lab work & pertinent test results  History of Anesthesia Complications Negative for: history of anesthetic complications  Airway Mallampati: II TM Distance: >3 FB Neck ROM: Full    Dental  (+) Dental Advisory Given, Chipped   Pulmonary former smoker (quit 6/14),  breath sounds clear to auscultation  Pulmonary exam normal       Cardiovascular hypertension (did not take BP meds today), Pt. on medications - anginaRhythm:Regular Rate:Normal     Neuro/Psych negative neurological ROS     GI/Hepatic Neg liver ROS, N/V with acute appy   Endo/Other  negative endocrine ROS  Renal/GU Renal InsufficiencyRenal disease (creat 1.38)     Musculoskeletal   Abdominal (+) + obese,   Peds  Hematology negative hematology ROS (+)   Anesthesia Other Findings   Reproductive/Obstetrics                          Anesthesia Physical Anesthesia Plan  ASA: II and emergent  Anesthesia Plan: General   Post-op Pain Management:    Induction: Intravenous and Rapid sequence  Airway Management Planned: Oral ETT  Additional Equipment:   Intra-op Plan:   Post-operative Plan: Extubation in OR  Informed Consent: I have reviewed the patients History and Physical, chart, labs and discussed the procedure including the risks, benefits and alternatives for the proposed anesthesia with the patient or authorized representative who has indicated his/her understanding and acceptance.   Dental advisory given  Plan Discussed with: CRNA and Surgeon  Anesthesia Plan Comments: (Plan routine monitors, GETA)        Anesthesia Quick Evaluation

## 2013-04-08 ENCOUNTER — Encounter (HOSPITAL_COMMUNITY): Payer: Self-pay | Admitting: General Practice

## 2013-04-08 DIAGNOSIS — I10 Essential (primary) hypertension: Secondary | ICD-10-CM

## 2013-04-08 LAB — CBC
HCT: 41.8 % (ref 39.0–52.0)
HEMOGLOBIN: 14.5 g/dL (ref 13.0–17.0)
MCH: 30.8 pg (ref 26.0–34.0)
MCHC: 34.7 g/dL (ref 30.0–36.0)
MCV: 88.7 fL (ref 78.0–100.0)
Platelets: 209 10*3/uL (ref 150–400)
RBC: 4.71 MIL/uL (ref 4.22–5.81)
RDW: 13.1 % (ref 11.5–15.5)
WBC: 11 10*3/uL — ABNORMAL HIGH (ref 4.0–10.5)

## 2013-04-08 LAB — BASIC METABOLIC PANEL
BUN: 14 mg/dL (ref 6–23)
CHLORIDE: 102 meq/L (ref 96–112)
CO2: 27 meq/L (ref 19–32)
CREATININE: 1.16 mg/dL (ref 0.50–1.35)
Calcium: 8.7 mg/dL (ref 8.4–10.5)
GFR calc non Af Amer: 68 mL/min — ABNORMAL LOW (ref >90)
GFR, EST AFRICAN AMERICAN: 78 mL/min — AB (ref >90)
GLUCOSE: 195 mg/dL — AB (ref 70–99)
Potassium: 4.7 mEq/L (ref 3.7–5.3)
Sodium: 141 mEq/L (ref 137–147)

## 2013-04-08 LAB — PHOSPHORUS: Phosphorus: 2.6 mg/dL (ref 2.3–4.6)

## 2013-04-08 LAB — MRSA PCR SCREENING: MRSA by PCR: NEGATIVE

## 2013-04-08 LAB — MAGNESIUM: Magnesium: 1.9 mg/dL (ref 1.5–2.5)

## 2013-04-08 MED ORDER — METOPROLOL TARTRATE 1 MG/ML IV SOLN: 5.0000 mg | Freq: Four times a day (QID) | INTRAVENOUS | Status: DC | PRN

## 2013-04-08 MED ORDER — PNEUMOCOCCAL VAC POLYVALENT 25 MCG/0.5ML IJ INJ
0.5000 mL | INJECTION | INTRAMUSCULAR | Status: AC
  Administered 2013-04-09: 0.5 mL via INTRAMUSCULAR
  Filled 2013-04-08: qty 0.5

## 2013-04-08 MED ORDER — SODIUM CHLORIDE 0.9 % IV SOLN
1.0000 g | INTRAVENOUS | Status: AC
  Administered 2013-04-08 – 2013-04-14 (×7): 1 g via INTRAVENOUS
  Filled 2013-04-08 (×7): qty 1

## 2013-04-08 NOTE — Op Note (Signed)
NAMEKAIYAN, LUCZAK               ACCOUNT NO.:  [id number]  MEDICAL RECORD NO.:  [id number]  LOCATION:  3S04C                        FACILITY:  [place]  PHYSICIAN:  [name], MD, FACSDATE OF BIRTH:  December 03, 1955  DATE OF PROCEDURE:  04/07/2013 DATE OF DISCHARGE:                              OPERATIVE REPORT   PREOPERATIVE DIAGNOSIS:  Ruptured appendicitis.  POSTOPERATIVE DIAGNOSIS:  Ruptured appendicitis.  PROCEDURE: 1. Attempted laparoscopic appendectomy. 2. Open ileocecectomy with appendectomy.  SURGEON:  [name], MD, FACS  ASSISTANT SURGEON:  Kathryne Eriksson. Hulen Skains, MD  ANESTHESIA:  General.  EBL:  Around 100 mL.  SPECIMEN: 1. Terminal ileum and cecum. 2. Additional ascending colon. 3. Appendix.  FINDINGS:  The patient did not have lot of contamination, however his appendix was no longer attached to the base of the cecum, it was only dangling by 2-3 mm thread.  Given the surrounding inflammation and induration of the cecum and the terminal ileum, I did not feel that stapling just barely across the cecum was a viable option, therefore I converted to an open procedure to do ileocecectomy.  INDICATIONS FOR PROCEDURE:  The patient is a 58 year old Caucasian male, who developed abdominal pain on Saturday, progressively worsened prompting him to come to the emergency room.  He had a white count of around 15,000.  CT scan was performed, which demonstrated findings consistent with probable ruptured appendicitis, but no abscess formation.  We discussed operative therapy versus nonoperative management.  Since there was not an abscess that was amenable to percutaneous drainage, I recommended a laparoscopic appendectomy with possible conversion to an open procedure.  We did discuss the distinct possibility of having to convert requiring bowel resection.  I discussed the risks and benefits of surgery including, but not limited to, bleeding, infection, injury to surrounding  structures, need to convert to an open procedure, anastomotic stricture, anastomotic leak, abscess formation, incisional hernia, wound infection, blood clot formation, perioperative cardiac and pulmonary complications, postoperative ileus, as well as typical postop recovery course, he elected to proceed with surgery.  DESCRIPTION OF PROCEDURE:  After obtaining informed consent, the patient was taken to the operating room to the [facility], he was placed supine on the operating table.  General endotracheal anesthesia was established.  A Foley catheter was placed.  His abdomen was prepped and draped in usual standard surgical fashion with ChloraPrep.  He received IV Invanz prior to skin incision.  Surgical time-out was performed.  Local was infiltrated at the base of umbilicus.  Next, a 1 inch vertical infraumbilical incision was made with a #11 blade.  The fascia was incised.  The abdominal cavity was entered.  A pursestring suture was placed around the fascial edges with 0 Vicryl.  The 12 mm Hasson trocar was placed.  Pneumoperitoneum was smoothly established up to a patient pressure of 15 mmHg.  Laparoscope was advanced and the abdominal cavity was surveilled.  There was no evidence of injury to surrounding structure.  I could visualize the cecum and the terminal ileum, but that was about the only structures I could see at that point.  Two 5-mm trocars were placed, 1 in the pubic area and 1  in the left lower quadrant, all under direct visualization after local had been infiltrated.  The patient was placed in a reverse Trendelenburg and rotated to the left.  It became quite obvious that the appendix had ruptured, identified the base of the cecum and the terminal ileum.  The epiploic appendage around the cecum was indurated and inflamed.  I identified what appeared to be a remnant of the appendix.  There were a few fecaliths in this area.  The base of the appendix was really  not attached to the cecum.  I continued to mobilize the terminal ileum, it was adhered to the pelvic sidewall, took it, mobilized it both bluntly as well as with EndoShear.  My initial hope was to try to partially stapler across the cecum, but the terminal ileum was essentially plastered to the medial part of the cecum, therefore, it would not accommodate the stapler.  At this point, I converted to an open procedure.  The infraumbilical trocar site skin incision was carried down to the suprapubic trocar site.  The fascia of the subcutaneous tissue was divided and the fascia was incised and the abdominal cavity was entered.  The left lower quadrant trocar was removed.  A Balfour retractor was placed.  I was able to take down some of the white line of Toldt.  It was very indurated on the lateral aspect of the cecum at the pericolic gutter at the base of the cecum.  I started to mobilize up the pericolic gutter.  At this point, visualization was somewhat limited.  I switched to Bookwalter retractor. At this point, I decided to go and divide the terminal ileum.  The terminal ileum was divided about 10 cm from the ileocecal valve with the GIA 75 blue load stapler.  I took down some of the mesentery staying close to the bowel with the LigaSure device.  At this point, Dr. Hulen Skains joined me in the operating room.  I continued to try to mobilize the ascending colon.  I found an area that was noninflamed.  A small rent was made in the mesentery and I stapled across the proximal ascending colon with a GIA 75 stapler.  After I disconnected the cecum from the ascending colon, and the staple line started leaking at one of the corners.  It was whipstitch with 3-0 silk suture.  I was able to completely take down the mesentery staying next to the terminal ileum and cecum.  This was done in the combination with the LigaSure as well as with a Kelly clamp, then 2-0 silk LigaSure.  This freed the specimen. The  appendix had been passed off the field as well as the terminal ileum and cecum.  I continued to try to mobilize and take down the hepatic flexure, but it was very challenging to see.  I could not really gain any traction because I felt the apex of the hepatic flexure was somewhat stuck.  The patient had a prior cholecystectomy.  I ended up having to extend the incision to above the umbilicus.  This aided somewhat.  I was able to visualize the duodenum and after about 20-30 minutes to mobilize it and bring down the hepatic flexure in its entirety.  The proximal colon staple line and about 1 inch of the colon next to the staple line was appearing ischemic and hyperemic, therefore I elected to resect some additional colon with a GIA 75 stapler with blue load.  At this point, I brought the terminal ileum  and the ascending colon side-to-side and placed 3-0 silk along the staple line.  Enterotomies were then made in the terminal ileum and in the ascending colon, one fork of a GIA 75 stapler with a blue load was placed through each enterotomy, the stapler was brought together and fired to create a common channel, it appeared widely patent.  There was no bleeding.  The common defect was then closed with a TA 60 stapler with a blue load.  The anastomosis was widely patent and open.  A 3-0 silk stitch was placed in the crotch of the anastomosis.  I placed several 2-0 silk sutures along the common closure staple line.  I left the mesenteric defect open.  Prior to the anastomosis, I ensured that the small bowel was not twisted and prior to re-anastomosing the colon and small bowel, I did visualize the right upper quadrant and irrigated and there was no sign of bleeding.  I did have about a 1-cm tear in the liver capsule from where there had been adhesions to the liver.  Bovie electrocautery was used for hemostasis, and I also placed a piece of surgical snow over this area.  The abdomen was irrigated.   The retractors were removed.  I reapproximated the fascia with #1 looped PDS, 1 from above, 1 from below.  The skin was left open.  It was packed with moist Kerlix and 4x4s, and covered with gauze. An NG tube had been placed, at the end of surgery, we confirmed placement by palpating the stomach.  All needle, instrument, sponge counts were correct x2.  There were no immediate complications.  The patient tolerated procedure well.     [name], MD, FACS     EMW/MEDQ  D:  04/07/2013  T:  04/08/2013  Job:  [id number]

## 2013-04-08 NOTE — Progress Notes (Signed)
Patient ID: Ernest Hudson, male   DOB: 1955/02/28, 58 y.o.   MRN: [id number]  Subjective: Hypoxic episode overnight, cxr with atelectasis.  Likely lack of IS use, pain medication.  He is stable now.  Sitting up in a chair.  Denies shortness of breath.  No flatus.  Objective:  Vital signs:  Filed Vitals:   04/07/13 2315 04/07/13 2340 04/08/13 0220 04/08/13 0335  BP: 118/75 144/82 144/86 149/86  Pulse: 98 87 95 95  Temp:  97.6 F (36.4 C)  98.5 F (36.9 C)  TempSrc:  Oral  Oral  Resp: 16 14 22 23   Height:  5' 11"  (1.803 m)    Weight:  217 lb 6 oz (98.6 kg)    SpO2: 97% 95% 96% 97%       Intake/Output   Yesterday:  02/16 0701 - 02/17 0700 In: 5218.8 [I.V.:4938.8; NG/GT:30; IV Piggyback:250] Out: 1000 [Urine:700; Blood:300] This shift:    I/O last 3 completed shifts: In: 5218.8 [I.V.:4938.8; NG/GT:30; IV Piggyback:250] Out: 1000 [Urine:700; Blood:300]    Bowel function:  Flatus: none  BM: none  Drain: n/a  Physical Exam:  General: Pt awake/alert/oriented x3 in no acute distress Chest: CTA No chest wall pain w good excursion CV:  Pulses intact.  Regular rhythm MS: Normal AROM mjr joints.  No obvious deformity Abdomen: Soft.  Nondistended. Absent bowel sounds.  Appropriately tender.  Incision is c/d/i. Ext:  SCDs BLE.  No mjr edema.  No cyanosis Skin: No petechiae / purpura   Problem List:   Active Problems:   Acute perforated appendicitis   Ruptured appendicitis    Results:   Labs: Results for orders placed during the hospital encounter of 04/07/13 (from the past 48 hour(s))  CBC WITH DIFFERENTIAL     Status: Abnormal   Collection Time    04/07/13 11:22 AM      Result Value Ref Range   WBC 15.2 (*) 4.0 - 10.5 K/uL   RBC 5.09  4.22 - 5.81 MIL/uL   Hemoglobin 16.1  13.0 - 17.0 g/dL   HCT 44.4  39.0 - 52.0 %   MCV 87.2  78.0 - 100.0 fL   MCH 31.6  26.0 - 34.0 pg   MCHC 36.3 (*) 30.0 - 36.0 g/dL   RDW 12.7  11.5 - 15.5 %   Platelets 233  150 - 400  K/uL   Neutrophils Relative % 77  43 - 77 %   Neutro Abs 11.7 (*) 1.7 - 7.7 K/uL   Lymphocytes Relative 15  12 - 46 %   Lymphs Abs 2.2  0.7 - 4.0 K/uL   Monocytes Relative 8  3 - 12 %   Monocytes Absolute 1.2 (*) 0.1 - 1.0 K/uL   Eosinophils Relative 0  0 - 5 %   Eosinophils Absolute 0.0  0.0 - 0.7 K/uL   Basophils Relative 0  0 - 1 %   Basophils Absolute 0.0  0.0 - 0.1 K/uL  COMPREHENSIVE METABOLIC PANEL     Status: Abnormal   Collection Time    04/07/13 11:22 AM      Result Value Ref Range   Sodium 142  137 - 147 mEq/L   Potassium 4.1  3.7 - 5.3 mEq/L   Chloride 101  96 - 112 mEq/L   CO2 27  19 - 32 mEq/L   Glucose, Bld 101 (*) 70 - 99 mg/dL   BUN 15  6 - 23 mg/dL   Creatinine, Ser 1.38 (*) 0.50 -  1.35 mg/dL   Calcium 9.4  8.4 - 10.5 mg/dL   Total Protein 7.6  6.0 - 8.3 g/dL   Albumin 3.8  3.5 - 5.2 g/dL   AST 17  0 - 37 U/L   ALT 25  0 - 53 U/L   Alkaline Phosphatase 88  39 - 117 U/L   Total Bilirubin 0.9  0.3 - 1.2 mg/dL   GFR calc non Af Amer 55 (*) >90 mL/min   GFR calc Af Amer 64 (*) >90 mL/min   Comment: (NOTE)     The eGFR has been calculated using the CKD EPI equation.     This calculation has not been validated in all clinical situations.     eGFR's persistently <90 mL/min signify possible Chronic Kidney     Disease.  LIPASE, BLOOD     Status: None   Collection Time    04/07/13 11:22 AM      Result Value Ref Range   Lipase 25  11 - 59 U/L  URINALYSIS, ROUTINE W REFLEX MICROSCOPIC     Status: Abnormal   Collection Time    04/07/13 11:24 AM      Result Value Ref Range   Color, Urine ORANGE (*) YELLOW   Comment: BIOCHEMICALS MAY BE AFFECTED BY COLOR   APPearance CLOUDY (*) CLEAR   Specific Gravity, Urine 1.036 (*) 1.005 - 1.030   pH 5.5  5.0 - 8.0   Glucose, UA NEGATIVE  NEGATIVE mg/dL   Hgb urine dipstick NEGATIVE  NEGATIVE   Bilirubin Urine SMALL (*) NEGATIVE   Ketones, ur 15 (*) NEGATIVE mg/dL   Protein, ur 30 (*) NEGATIVE mg/dL   Urobilinogen, UA 1.0   0.0 - 1.0 mg/dL   Nitrite POSITIVE (*) NEGATIVE   Leukocytes, UA TRACE (*) NEGATIVE  URINE MICROSCOPIC-ADD ON     Status: Abnormal   Collection Time    04/07/13 11:24 AM      Result Value Ref Range   Squamous Epithelial / LPF RARE  RARE   WBC, UA 0-2  <3 WBC/hpf   Bacteria, UA MANY (*) RARE   Casts HYALINE CASTS (*) NEGATIVE   Urine-Other MUCOUS PRESENT    BASIC METABOLIC PANEL     Status: Abnormal   Collection Time    04/08/13  3:10 AM      Result Value Ref Range   Sodium 141  137 - 147 mEq/L   Potassium 4.7  3.7 - 5.3 mEq/L   Chloride 102  96 - 112 mEq/L   CO2 27  19 - 32 mEq/L   Glucose, Bld 195 (*) 70 - 99 mg/dL   BUN 14  6 - 23 mg/dL   Creatinine, Ser 1.16  0.50 - 1.35 mg/dL   Calcium 8.7  8.4 - 10.5 mg/dL   GFR calc non Af Amer 68 (*) >90 mL/min   GFR calc Af Amer 78 (*) >90 mL/min   Comment: (NOTE)     The eGFR has been calculated using the CKD EPI equation.     This calculation has not been validated in all clinical situations.     eGFR's persistently <90 mL/min signify possible Chronic Kidney     Disease.  CBC     Status: Abnormal   Collection Time    04/08/13  3:10 AM      Result Value Ref Range   WBC 11.0 (*) 4.0 - 10.5 K/uL   RBC 4.71  4.22 - 5.81 MIL/uL   Hemoglobin 14.5  13.0 - 17.0 g/dL   HCT 41.8  39.0 - 52.0 %   MCV 88.7  78.0 - 100.0 fL   MCH 30.8  26.0 - 34.0 pg   MCHC 34.7  30.0 - 36.0 g/dL   RDW 13.1  11.5 - 15.5 %   Platelets 209  150 - 400 K/uL  MAGNESIUM     Status: None   Collection Time    04/08/13  3:10 AM      Result Value Ref Range   Magnesium 1.9  1.5 - 2.5 mg/dL  PHOSPHORUS     Status: None   Collection Time    04/08/13  3:10 AM      Result Value Ref Range   Phosphorus 2.6  2.3 - 4.6 mg/dL  MRSA PCR SCREENING     Status: None   Collection Time    04/08/13  4:26 AM      Result Value Ref Range   MRSA by PCR NEGATIVE  NEGATIVE   Comment:            The GeneXpert MRSA Assay (FDA     approved for NASAL specimens     only), is  one component of a     comprehensive MRSA colonization     surveillance program. It is not     intended to diagnose MRSA     infection nor to guide or     monitor treatment for     MRSA infections.    Imaging / Studies: Ct Abdomen Pelvis W Contrast  04/07/2013   CLINICAL DATA:  Generalized lower abdominal pain, nausea, vomiting  EXAM: CT ABDOMEN AND PELVIS WITH CONTRAST  TECHNIQUE: Multidetector CT imaging of the abdomen and pelvis was performed using the standard protocol following bolus administration of intravenous contrast.  CONTRAST:  113m OMNIPAQUE IOHEXOL 300 MG/ML  SOLN  COMPARISON:  11/30/2010, 12/27/2010  FINDINGS: Scattered bibasilar bullous disease and blebs. Minor basilar atelectasis versus scarring. Appearance is similar to the prior study. Normal heart size. No pericardial or pleural effusion. Small hiatal hernia noted. Atherosclerotic changes of the lower thoracic aorta.  Abdomen: 2.9 cm left adrenal round mass noted, stable in size correlating with a left adrenal adenoma by comparison MRI.  Prior cholecystectomy noted. Liver, biliary system, pancreas, spleen, and kidneys are within normal limits for age and demonstrate no acute finding. Stable small hyperdense cyst in the left kidney lower pole medially measures 10 mm, image 39.  Atherosclerotic changes of the aorta without aneurysm or occlusion.  no abdominal fluid collection, hemorrhage, abscess, or adenopathy.  Negative for bowel obstruction, dilatation, or ileus.  In the right lower quadrant, strandy edema and inflammation is noted about the cecum. This extends along the right hemipelvis. Small gas bubbles noted in the adjacent pericecal fat. Appendix appears abnormal, images 60 through 65 with evidence of distention and wall irregularity. Appendicolith noted at the appendix orifice, image 60. Findings compatible with acute appendicitis with a localized contained rupture.  Pelvis: Trace pelvic free fluid. Negative for abscess,  hemorrhage, adenopathy, inguinal abnormality, or hernia. Urinary bladder is collapsed.  Minor degenerative changes of the spine.  No acute osseous finding.  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 Dr. WChristy Gentles who verbally acknowledged these results.   Electronically  Signed   By: Daryll Brod M.D.   On: 04/07/2013 14:29   Dg Chest Port 1 View  04/07/2013   CLINICAL DATA:  Postop.  Desaturation.  EXAM: PORTABLE CHEST - 1 VIEW  COMPARISON:  11/30/2012  FINDINGS: NG tube is seen entering the stomach. There is bibasilar atelectasis, with decreasing lung volumes. No visible effusions. Heart is normal size. No acute bony abnormality.  IMPRESSION: Decreasing lung volumes with bibasilar atelectasis.   Electronically Signed   By: Rolm Baptise M.D.   On: 04/07/2013 21:52    Medications / Allergies: per chart  Antibiotics: Anti-infectives   Start     Dose/Rate Route Frequency Ordered Stop   04/07/13 1430  [MAR Hold]  ertapenem (INVANZ) 1 g in sodium chloride 0.9 % 50 mL IVPB     (On MAR Hold since 04/07/13 1550)   1 g 100 mL/hr over 30 Minutes Intravenous  Once 04/07/13 1424 04/07/13 1614      Assessment/Plan Perforated appendix  S/p laparoscopic converted to open appendectomy, partial colectomy and ileocectomy(Dr. [name] 04/07/13) -POD#1 -Continue bowel rest, NGT, ice chips and await bowel function -Continue PCA -IS -DC foley -mobilize -lovenox, SCDs -IV hydration -?antibiotics Hypoxia -PRN 02 to maintain sats >93% -aggressive pulmonary toilet Presume UTI -urine culture pending  Dispo-okay for floor transfer? HTN -Home meds on hold -PRN metoprolol  Erby Pian, [facility] Pager [phone] Office  [phone]  04/08/2013 9:08 AM

## 2013-04-08 NOTE — ED Provider Notes (Signed)
Medical screening examination/treatment/procedure(s) were conducted as a shared visit with non-physician practitioner(s) and myself.  I personally evaluated the patient during the encounter.      Sharyon Cable, MD 04/08/13 [phone]

## 2013-04-08 NOTE — Progress Notes (Signed)
Pain ok. Got N with walking. Concentrated urine.  No n now.   Alert, nad cta b/l Soft, distended. Dressing intact  tx to floor Keep foley today, d/c in am Keep NG tube, can have ice chips Cont Invanz for intra-abd contamination  [name], MD, FACS General, Bariatric, & Minimally Invasive Surgery [facility], [place]

## 2013-04-08 NOTE — Progress Notes (Signed)
Arrived to 6n24 with wife at bedside, oriented to room and surroundings, denies pain/nausea.

## 2013-04-08 NOTE — Progress Notes (Signed)
Utilization review completed.  

## 2013-04-08 NOTE — Anesthesia Postprocedure Evaluation (Signed)
  Anesthesia Post-op Note  Patient: Ernest Hudson  Procedure(s) Performed: Procedure(s): ATTEMPTED APPENDECTOMY LAPAROSCOPIC (N/A) OPEN APPENDECTOMY PARTIAL COLECTOMY, ILEOCECTOMY  Patient Location: PACU  Anesthesia Type:General  Level of Consciousness: awake  Airway and Oxygen Therapy: Patient Spontanous Breathing  Post-op Pain: mild  Post-op Assessment: Post-op Vital signs reviewed  Post-op Vital Signs: Reviewed  Complications: No apparent anesthesia complications

## 2013-04-09 LAB — CBC
HEMATOCRIT: 35.4 % — AB (ref 39.0–52.0)
Hemoglobin: 12.3 g/dL — ABNORMAL LOW (ref 13.0–17.0)
MCH: 30.9 pg (ref 26.0–34.0)
MCHC: 34.7 g/dL (ref 30.0–36.0)
MCV: 88.9 fL (ref 78.0–100.0)
Platelets: 181 10*3/uL (ref 150–400)
RBC: 3.98 MIL/uL — AB (ref 4.22–5.81)
RDW: 13 % (ref 11.5–15.5)
WBC: 8.4 10*3/uL (ref 4.0–10.5)

## 2013-04-09 LAB — BASIC METABOLIC PANEL
BUN: 13 mg/dL (ref 6–23)
CALCIUM: 8.6 mg/dL (ref 8.4–10.5)
CO2: 28 mEq/L (ref 19–32)
Chloride: 103 mEq/L (ref 96–112)
Creatinine, Ser: 1 mg/dL (ref 0.50–1.35)
GFR calc Af Amer: >90 mL/min (ref >90)
GFR, EST NON AFRICAN AMERICAN: 81 mL/min — AB (ref >90)
GLUCOSE: 125 mg/dL — AB (ref 70–99)
Potassium: 4.2 mEq/L (ref 3.7–5.3)
Sodium: 140 mEq/L (ref 137–147)

## 2013-04-09 MED ORDER — PHENOL 1.4 % MT LIQD
1.0000 | OROMUCOSAL | Status: DC | PRN
Start: 2013-04-09 — End: 2013-04-17
  Administered 2013-04-09: 1 via OROMUCOSAL
  Filled 2013-04-09: qty 177

## 2013-04-09 NOTE — Progress Notes (Signed)
2 Days Post-Op  Subjective: No flatus.  Pt breathing ok. sore  Objective: Vital signs in last 24 hours: Temp:  [97.5 F (36.4 C)-98 F (36.7 C)] 98 F (36.7 C) (02/18 0455) Pulse Rate:  [79-90] 79 (02/18 0455) Resp:  [12-20] 20 (02/18 0800) BP: (116-125)/(59-72) 118/59 mmHg (02/18 0455) SpO2:  [93 %-99 %] 96 % (02/18 0800) Last BM Date: 04/07/13  Intake/Output from previous day: 02/17 0701 - 02/18 0700 In: 3062.5 [P.O.:75; I.V.:2937.5; IV Piggyback:50] Out: 1610 [Urine:1550; Emesis/NG output:100] Intake/Output this shift:    Incision/Wound:wound open and clean.  Distended soft but appropriately sore.   Lab Results:   Recent Labs  04/08/13 0310 04/09/13 0504  WBC 11.0* 8.4  HGB 14.5 12.3*  HCT 41.8 35.4*  PLT 209 181   BMET  Recent Labs  04/08/13 0310 04/09/13 0504  NA 141 140  K 4.7 4.2  CL 102 103  CO2 27 28  GLUCOSE 195* 125*  BUN 14 13  CREATININE 1.16 1.00  CALCIUM 8.7 8.6   PT/INR No results found for this basename: LABPROT, INR,  in the last 72 hours ABG No results found for this basename: PHART, PCO2, PO2, HCO3,  in the last 72 hours  Studies/Results: Ct Abdomen Pelvis W Contrast  04/07/2013   CLINICAL DATA:  Generalized lower abdominal pain, nausea, vomiting  EXAM: CT ABDOMEN AND PELVIS WITH CONTRAST  TECHNIQUE: Multidetector CT imaging of the abdomen and pelvis was performed using the standard protocol following bolus administration of intravenous contrast.  CONTRAST:  124mL OMNIPAQUE IOHEXOL 300 MG/ML  SOLN  COMPARISON:  11/30/2010, 12/27/2010  FINDINGS: Scattered bibasilar bullous disease and blebs. Minor basilar atelectasis versus scarring. Appearance is similar to the prior study. Normal heart size. No pericardial or pleural effusion. Small hiatal hernia noted. Atherosclerotic changes of the lower thoracic aorta.  Abdomen: 2.9 cm left adrenal round mass noted, stable in size correlating with a left adrenal adenoma by comparison MRI.  Prior  cholecystectomy noted. Liver, biliary system, pancreas, spleen, and kidneys are within normal limits for age and demonstrate no acute finding. Stable small hyperdense cyst in the left kidney lower pole medially measures 10 mm, image 39.  Atherosclerotic changes of the aorta without aneurysm or occlusion.  no abdominal fluid collection, hemorrhage, abscess, or adenopathy.  Negative for bowel obstruction, dilatation, or ileus.  In the right lower quadrant, strandy edema and inflammation is noted about the cecum. This extends along the right hemipelvis. Small gas bubbles noted in the adjacent pericecal fat. Appendix appears abnormal, images 60 through 65 with evidence of distention and wall irregularity. Appendicolith noted at the appendix orifice, image 60. Findings compatible with acute appendicitis with a localized contained rupture.  Pelvis: Trace pelvic free fluid. Negative for abscess, hemorrhage, adenopathy, inguinal abnormality, or hernia. Urinary bladder is collapsed.  Minor degenerative changes of the spine.  No acute osseous finding.  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 Dr. Christy Gentles, who verbally acknowledged these results.   Electronically Signed   By: Daryll Brod M.D.   On: 04/07/2013 14:29   Dg Chest Port 1 View  04/07/2013   CLINICAL DATA:  Postop.  Desaturation.  EXAM: PORTABLE CHEST - 1 VIEW  COMPARISON:  11/30/2012  FINDINGS: NG tube is seen entering  the stomach. There is bibasilar atelectasis, with decreasing lung volumes. No visible effusions. Heart is normal size. No acute bony abnormality.  IMPRESSION: Decreasing lung volumes with bibasilar atelectasis.   Electronically Signed   By: Rolm Baptise M.D.   On: 04/07/2013 21:52     Anti-infectives: Anti-infectives   Start     Dose/Rate Route Frequency Ordered Stop   04/08/13 1200  ertapenem (INVANZ) 1 g in sodium chloride 0.9 % 50 mL IVPB     1 g 100 mL/hr over 30 Minutes Intravenous Every 24 hours 04/08/13 1032     04/07/13 1430  [MAR Hold]  ertapenem (INVANZ) 1 g in sodium chloride 0.9 % 50 mL IVPB     (On MAR Hold since 04/07/13 1550)   1 g 100 mL/hr over 30 Minutes Intravenous  Once 04/07/13 1424 04/07/13 1614      Assessment/Plan: s/p Procedure(s): ATTEMPTED APPENDECTOMY LAPAROSCOPIC (N/A) OPEN APPENDECTOMY PARTIAL COLECTOMY, ILEOCECTOMY Clamp NGT OOB No other changes until bowel function return Cont ABX  LOS: 2 days    Kanai Hilger A. 04/09/2013

## 2013-04-10 MED ORDER — CHLORHEXIDINE GLUCONATE 0.12 % MT SOLN
15.0000 mL | Freq: Two times a day (BID) | OROMUCOSAL | Status: DC
  Administered 2013-04-10 – 2013-04-17 (×14): 15 mL via OROMUCOSAL
  Filled 2013-04-10 (×10): qty 15

## 2013-04-10 MED ORDER — CHLORHEXIDINE GLUCONATE 0.12 % MT SOLN
OROMUCOSAL | Status: AC
  Filled 2013-04-10: qty 15

## 2013-04-10 MED ORDER — BIOTENE DRY MOUTH MT LIQD
15.0000 mL | Freq: Two times a day (BID) | OROMUCOSAL | Status: DC
  Administered 2013-04-10 – 2013-04-16 (×8): 15 mL via OROMUCOSAL

## 2013-04-10 NOTE — Progress Notes (Signed)
3 Days Post-Op  Subjective: Pt still quite bloated.  No flatus or BM yet.  No nausea/vomiting.  C/o abdominal pain.  Ambulating through the halls.  On PCA.  Using IS at about 1250.  Objective: Vital signs in last 24 hours: Temp:  [97.2 F (36.2 C)-99.1 F (37.3 C)] 97.2 F (36.2 C) (02/19 0620) Pulse Rate:  [70-85] 85 (02/19 0620) Resp:  [13-23] 19 (02/19 0620) BP: (114-135)/(60-73) 131/67 mmHg (02/19 0620) SpO2:  [92 %-96 %] 94 % (02/19 0620) Last BM Date: 04/07/13  Intake/Output from previous day: 02/18 0701 - 02/19 0700 In: 2987.5 [I.V.:2937.5; IV Piggyback:50] Out: 175 [Urine:75; Emesis/NG output:100] Intake/Output this shift:    PE: Gen:  Alert, NAD, pleasant Abd: Soft, moderate distension, mild tenderness to midline wound, +BS, no HSM, midline wound clean with beefy red tissue, no significant drainage   Lab Results:   Recent Labs  04/08/13 0310 04/09/13 0504  WBC 11.0* 8.4  HGB 14.5 12.3*  HCT 41.8 35.4*  PLT 209 181   BMET  Recent Labs  04/08/13 0310 04/09/13 0504  NA 141 140  K 4.7 4.2  CL 102 103  CO2 27 28  GLUCOSE 195* 125*  BUN 14 13  CREATININE 1.16 1.00  CALCIUM 8.7 8.6   PT/INR No results found for this basename: LABPROT, INR,  in the last 72 hours CMP     Component Value Date/Time   NA 140 04/09/2013 0504   K 4.2 04/09/2013 0504   CL 103 04/09/2013 0504   CO2 28 04/09/2013 0504   GLUCOSE 125* 04/09/2013 0504   BUN 13 04/09/2013 0504   CREATININE 1.00 04/09/2013 0504   CALCIUM 8.6 04/09/2013 0504   PROT 7.6 04/07/2013 1122   ALBUMIN 3.8 04/07/2013 1122   AST 17 04/07/2013 1122   ALT 25 04/07/2013 1122   ALKPHOS 88 04/07/2013 1122   BILITOT 0.9 04/07/2013 1122   GFRNONAA 81* 04/09/2013 0504   GFRAA >90 04/09/2013 0504   Lipase     Component Value Date/Time   LIPASE 25 04/07/2013 1122       Studies/Results: No results found.  Anti-infectives: Anti-infectives   Start     Dose/Rate Route Frequency Ordered Stop   04/08/13 1200   ertapenem (INVANZ) 1 g in sodium chloride 0.9 % 50 mL IVPB     1 g 100 mL/hr over 30 Minutes Intravenous Every 24 hours 04/08/13 1032     04/07/13 1430  [MAR Hold]  ertapenem (INVANZ) 1 g in sodium chloride 0.9 % 50 mL IVPB     (On MAR Hold since 04/07/13 1550)   1 g 100 mL/hr over 30 Minutes Intravenous  Once 04/07/13 1424 04/07/13 1614       Assessment/Plan Perforated appendix  POD #3 S/p laparoscopic converted to open appendectomy, partial colectomy and ileocectomy (Dr. [name] 04/07/13)  -No flatus or BM's yet, and abdomen is still very distended, continue bowel rest, NGT clamped overnight, no residuals checked, place back to suction, ice chips and await bowel function  -Continue PCA for now -Mobilize and IS  -lovenox, SCDs  -IV hydration  -antibiotics Invanz day 4/7? Hypoxia  -needing O2 less frequently >93%, cont IS Presume UTI  -urine culture pending  HTN  -Home meds on hold, PRN metoprolol     LOS: 3 days    DORT, Mishael Krysiak 04/10/2013, 8:30 AM Pager: [phone]

## 2013-04-10 NOTE — Progress Notes (Signed)
No flatus. Not much out of NG  Bloated/distended. Soft. Wound ok  Bowel rest IV abx Cont NG for now  [name], MD, FACS General, Bariatric, & Minimally Invasive Surgery [facility], [place]

## 2013-04-11 ENCOUNTER — Inpatient Hospital Stay (HOSPITAL_COMMUNITY): Payer: 59

## 2013-04-11 ENCOUNTER — Encounter (HOSPITAL_COMMUNITY): Payer: Self-pay | Admitting: General Surgery

## 2013-04-11 LAB — BASIC METABOLIC PANEL
BUN: 12 mg/dL (ref 6–23)
CALCIUM: 8.5 mg/dL (ref 8.4–10.5)
CO2: 28 mEq/L (ref 19–32)
Chloride: 100 mEq/L (ref 96–112)
Creatinine, Ser: 0.97 mg/dL (ref 0.50–1.35)
GFR calc Af Amer: >90 mL/min (ref >90)
GFR, EST NON AFRICAN AMERICAN: 89 mL/min — AB (ref >90)
GLUCOSE: 135 mg/dL — AB (ref 70–99)
POTASSIUM: 4.2 meq/L (ref 3.7–5.3)
Sodium: 138 mEq/L (ref 137–147)

## 2013-04-11 LAB — MAGNESIUM: Magnesium: 2.2 mg/dL (ref 1.5–2.5)

## 2013-04-11 MED ORDER — ALBUTEROL SULFATE (2.5 MG/3ML) 0.083% IN NEBU
2.5000 mg | INHALATION_SOLUTION | RESPIRATORY_TRACT | Status: DC | PRN
  Filled 2013-04-11: qty 3

## 2013-04-11 MED ORDER — BISACODYL 10 MG RE SUPP
10.0000 mg | Freq: Once | RECTAL | Status: AC
  Administered 2013-04-11: 10 mg via RECTAL
  Filled 2013-04-11: qty 1

## 2013-04-11 MED ORDER — MORPHINE SULFATE 2 MG/ML IJ SOLN
1.0000 mg | INTRAMUSCULAR | Status: DC | PRN
  Administered 2013-04-12 (×2): 4 mg via INTRAVENOUS
  Filled 2013-04-11 (×4): qty 2

## 2013-04-11 MED ORDER — METHOCARBAMOL 100 MG/ML IJ SOLN
750.0000 mg | Freq: Four times a day (QID) | INTRAMUSCULAR | Status: DC | PRN
  Administered 2013-04-12: 750 mg via INTRAVENOUS
  Filled 2013-04-11 (×2): qty 7.5

## 2013-04-11 MED ORDER — KETOROLAC TROMETHAMINE 15 MG/ML IJ SOLN
15.0000 mg | Freq: Four times a day (QID) | INTRAMUSCULAR | Status: DC | PRN
  Administered 2013-04-11 (×2): 15 mg via INTRAVENOUS
  Administered 2013-04-12: 30 mg via INTRAVENOUS
  Administered 2013-04-12 (×2): 15 mg via INTRAVENOUS
  Administered 2013-04-12: 30 mg via INTRAVENOUS
  Administered 2013-04-13: 15 mg via INTRAVENOUS
  Filled 2013-04-11 (×3): qty 1
  Filled 2013-04-11: qty 2
  Filled 2013-04-11: qty 1
  Filled 2013-04-11: qty 2
  Filled 2013-04-11: qty 1

## 2013-04-11 MED ORDER — IPRATROPIUM BROMIDE 0.02 % IN SOLN: 0.5000 mg | RESPIRATORY_TRACT | Status: DC | PRN

## 2013-04-11 NOTE — Progress Notes (Signed)
4 Days Post-Op  Subjective: Pt c/o a lot of pain and continued distension, did not sleep well.  No flatus or BM yet.  Ambulating through the halls and using IS.  Frustrated, wants to go home.  Objective: Vital signs in last 24 hours: Temp:  [97.6 F (36.4 C)-99.4 F (37.4 C)] 97.6 F (36.4 C) (02/20 0856) Pulse Rate:  [66-81] 81 (02/20 0856) Resp:  [10-25] 20 (02/20 0856) BP: (116-135)/(68-85) 122/79 mmHg (02/20 0856) SpO2:  [92 %-98 %] 94 % (02/20 0856) Last BM Date: 04/07/13  Intake/Output from previous day: 02/19 0701 - 02/20 0700 In: 30 [NG/GT:30] Out: 350 [Urine:100; Emesis/NG output:250] Intake/Output this shift:    PE: Gen:  Alert, NAD, pleasant Abd: Soft, moderate tenderness, +distension, few BS, no HSM, wound dressing clean/dry/intact   Lab Results:   Recent Labs  04/09/13 0504  WBC 8.4  HGB 12.3*  HCT 35.4*  PLT 181   BMET  Recent Labs  04/09/13 0504 04/11/13 0312  NA 140 138  K 4.2 4.2  CL 103 100  CO2 28 28  GLUCOSE 125* 135*  BUN 13 12  CREATININE 1.00 0.97  CALCIUM 8.6 8.5   PT/INR No results found for this basename: LABPROT, INR,  in the last 72 hours CMP     Component Value Date/Time   NA 138 04/11/2013 0312   K 4.2 04/11/2013 0312   CL 100 04/11/2013 0312   CO2 28 04/11/2013 0312   GLUCOSE 135* 04/11/2013 0312   BUN 12 04/11/2013 0312   CREATININE 0.97 04/11/2013 0312   CALCIUM 8.5 04/11/2013 0312   PROT 7.6 04/07/2013 1122   ALBUMIN 3.8 04/07/2013 1122   AST 17 04/07/2013 1122   ALT 25 04/07/2013 1122   ALKPHOS 88 04/07/2013 1122   BILITOT 0.9 04/07/2013 1122   GFRNONAA 89* 04/11/2013 0312   GFRAA >90 04/11/2013 0312   Lipase     Component Value Date/Time   LIPASE 25 04/07/2013 1122       Studies/Results: Dg Abd 1 View  04/11/2013   CLINICAL DATA:  Ileus  EXAM: ABDOMEN - 1 VIEW  COMPARISON:  CT abdomen and pelvis April 07, 2013  FINDINGS: There is contrast in the colon. There remain multiple loops of borderline dilated small  bowel. There is air in contrast in the rectum. No free air. Nasogastric tube tip and side port are in the stomach. There are surgical clips in the right upper quadrant.  IMPRESSION: There is persistence of mildly dilated small bowel loops. A degree of ileus is suspected. Partial obstruction could present in this manner but is felt to be less likely. No free air apparent on this supine examination. Moderate contrast in colon and distal small bowel.   Electronically Signed   By: Lowella Grip M.D.   On: 04/11/2013 10:28    Anti-infectives: Anti-infectives   Start     Dose/Rate Route Frequency Ordered Stop   04/08/13 1200  ertapenem (INVANZ) 1 g in sodium chloride 0.9 % 50 mL IVPB     1 g 100 mL/hr over 30 Minutes Intravenous Every 24 hours 04/08/13 1032     04/07/13 1430  [MAR Hold]  ertapenem (INVANZ) 1 g in sodium chloride 0.9 % 50 mL IVPB     (On MAR Hold since 04/07/13 1550)   1 g 100 mL/hr over 30 Minutes Intravenous  Once 04/07/13 1424 04/07/13 1614       Assessment/Plan Perforated appendix  POD #4 S/p laparoscopic converted to open  appendectomy, partial colectomy and ileocectomy (Dr. [name] 04/07/13)  -No flatus or BM's yet, and abdomen is still distended, continue bowel rest, NGT clamped with minimal residuals, continue clamping/residual checks, await bowel function  -Mobilize and IS  -lovenox, SCDs  -IV hydration  -Antibiotics Invanz day 5/7, set to stop after 7 doses -D/c PCA, add toradol, robaxin, and start PRN IV morphine Hypoxia  -weaned off O2, using IS well Presume UTI  -urine culture cancelled on 18th, no urinary symptoms currently HTN  -Home meds on hold, PRN metoprolol     LOS: 4 days    DORT, Fawnda Vitullo 04/11/2013, 12:07 PM Pager: [phone]

## 2013-04-11 NOTE — Progress Notes (Signed)
Tolerating NG tube placement. No flatus. Ambulating  Soft, some distention. Not terribly tender  Repeat plain film this afternoon shows some ongoing small bowel dilatation  Will remove NG tube. Suppository. Hopefully start clears in the morning Continue antibiotics for a total of 7 days  Ernest Hudson. [name], MD, FACS General, Bariatric, & Minimally Invasive Surgery [facility], [place]

## 2013-04-11 NOTE — Progress Notes (Signed)
Called to assess patient for breathing treatment PRN dose. Patient has clear BBS diminished in the bases with what sounds like slight wheezes from the neck area. No indication for a breathing treatment but discussed with patient about using his chloraseptic spray and swishing and gargling to help with the soreness. Told if any changes we would gladly come back and assess for possible treatments in the future.

## 2013-04-12 ENCOUNTER — Inpatient Hospital Stay (HOSPITAL_COMMUNITY): Payer: 59

## 2013-04-12 MED ORDER — ONDANSETRON HCL 4 MG/2ML IJ SOLN
4.0000 mg | Freq: Four times a day (QID) | INTRAMUSCULAR | Status: DC | PRN
  Administered 2013-04-12: 4 mg via INTRAVENOUS
  Filled 2013-04-12: qty 2

## 2013-04-12 MED ORDER — ONDANSETRON HCL 4 MG/2ML IJ SOLN
4.0000 mg | Freq: Four times a day (QID) | INTRAMUSCULAR | Status: DC
Start: 2013-04-12 — End: 2013-04-12
  Administered 2013-04-12: 4 mg via INTRAVENOUS
  Filled 2013-04-12: qty 2

## 2013-04-12 NOTE — Progress Notes (Signed)
5 Days Post-Op  Subjective: After suppositories, he had 4 loose stools. He says he still feels tired and does not feel well, but denies nausea and is hungry. He denies that his abdomen is distended. Says he is ambulating.  Afebrile.Heart rate 76. SpO2 98% on room air.  Objective: Vital signs in last 24 hours: Temp:  [97.8 F (36.6 C)-99.9 F (37.7 C)] 98.1 F (36.7 C) (02/21 0541) Pulse Rate:  [70-97] 76 (02/21 0541) Resp:  [14-18] 18 (02/21 0541) BP: (127-156)/(74-87) 146/77 mmHg (02/21 0541) SpO2:  [95 %-99 %] 98 % (02/21 0541) Last BM Date: 04/11/13  Intake/Output from previous day: 02/20 0701 - 02/21 0700 In: 1300 [I.V.:1250; IV Piggyback:50] Out: -  Intake/Output this shift:    General appearance: alert. Mental status normal. No distress. Appears a little frustrated..  Wife is in room Resp: clear to auscultation bilaterally GI: abdomen is soft but seems distended. Rare bowel sounds. Not very tympanitic. Marland Kitchen Wounds clean. Minimal, appropriate tenderness.  Lab Results:  No results found for this or any previous visit (from the past 24 hour(s)).   Studies/Results: @RISRSLT24 @  . antiseptic oral rinse  15 mL Mouth Rinse q12n4p  . chlorhexidine  15 mL Mouth Rinse BID  . enoxaparin (LOVENOX) injection  40 mg Subcutaneous Q24H  . ertapenem  1 g Intravenous Q24H  . pantoprazole (PROTONIX) IV  40 mg Intravenous Q24H     Assessment/Plan: s/p Procedure(s): ATTEMPTED APPENDECTOMY LAPAROSCOPIC OPEN APPENDECTOMY PARTIAL COLECTOMY, ILEOCECTOMY  Perforated appendix  POD #4 S/p laparoscopic converted to open ileocectomy (Dr. [name] 04/07/13)  Although still seems a little distended, ileus is resolving clinically. We'll allow clear liquids. Check abdominal x-ray. -Mobilize more and IS  -lovenox, SCDs  -IV hydration  -Antibiotics Invanz day 5/7, set to stop after 7 doses  -D/c PCA, add toradol, robaxin, and start PRN IV morphine   Hypoxia - resolved Presume UTI  -urine  culture cancelled on 18th, no urinary symptoms currently  HTN  -Home meds on hold, PRN metoprolol   @PROBHOSP @  LOS: 5 days    Majorie Santee M 04/12/2013  . .prob

## 2013-04-13 LAB — CBC
HEMATOCRIT: 37.7 % — AB (ref 39.0–52.0)
Hemoglobin: 13.6 g/dL (ref 13.0–17.0)
MCH: 31.1 pg (ref 26.0–34.0)
MCHC: 36.1 g/dL — ABNORMAL HIGH (ref 30.0–36.0)
MCV: 86.3 fL (ref 78.0–100.0)
Platelets: 335 10*3/uL (ref 150–400)
RBC: 4.37 MIL/uL (ref 4.22–5.81)
RDW: 12.6 % (ref 11.5–15.5)
WBC: 9 10*3/uL (ref 4.0–10.5)

## 2013-04-13 LAB — BASIC METABOLIC PANEL
BUN: 11 mg/dL (ref 6–23)
CHLORIDE: 101 meq/L (ref 96–112)
CO2: 28 meq/L (ref 19–32)
CREATININE: 0.95 mg/dL (ref 0.50–1.35)
Calcium: 9.2 mg/dL (ref 8.4–10.5)
GFR calc Af Amer: >90 mL/min (ref >90)
GFR calc non Af Amer: >90 mL/min (ref >90)
GLUCOSE: 146 mg/dL — AB (ref 70–99)
Potassium: 4.2 mEq/L (ref 3.7–5.3)
Sodium: 139 mEq/L (ref 137–147)

## 2013-04-13 MED ORDER — ONDANSETRON HCL 4 MG/2ML IJ SOLN: 4.0000 mg | Freq: Four times a day (QID) | INTRAMUSCULAR | Status: DC | PRN

## 2013-04-13 MED ORDER — SODIUM CHLORIDE 0.9 % IJ SOLN: 9.0000 mL | INTRAMUSCULAR | Status: DC | PRN

## 2013-04-13 MED ORDER — HYDROMORPHONE 0.3 MG/ML IV SOLN
INTRAVENOUS | Status: DC
  Administered 2013-04-13: 0.6 mg via INTRAVENOUS
  Administered 2013-04-13: 09:00:00 via INTRAVENOUS
  Administered 2013-04-14: 0.3 mg via INTRAVENOUS
  Filled 2013-04-13: qty 25

## 2013-04-13 MED ORDER — NALOXONE HCL 0.4 MG/ML IJ SOLN
0.4000 mg | INTRAMUSCULAR | Status: DC | PRN
Start: 2013-04-13 — End: 2013-04-14

## 2013-04-13 MED ORDER — DIPHENHYDRAMINE HCL 12.5 MG/5ML PO ELIX: 12.5000 mg | ORAL_SOLUTION | Freq: Four times a day (QID) | ORAL | Status: DC | PRN

## 2013-04-13 MED ORDER — DIPHENHYDRAMINE HCL 50 MG/ML IJ SOLN
12.5000 mg | Freq: Four times a day (QID) | INTRAMUSCULAR | Status: DC | PRN
Start: 2013-04-13 — End: 2013-04-14

## 2013-04-13 NOTE — Progress Notes (Signed)
6 Days Post-Op  Subjective: Doesn't feel good. Have some abdominal pain.Says he vomited a couple of times. No flatus. Passing minimal stool. Abdominal x-rays look like distal SBO, but could simply be ileus.  Hemoglobin 13.6. WBC 9000. Potassium 4.2. Creatinine 0.95. Glucose 146.  Urine output not recorded. One thousand cc emesis recorded from yesterday.  Objective: Vital signs in last 24 hours: Temp:  [97.2 F (36.2 C)-98.3 F (36.8 C)] 98.2 F (36.8 C) (02/22 0611) Pulse Rate:  [71-74] 71 (02/22 0611) Resp:  [16-18] 18 (02/22 0611) BP: (153-160)/(90-100) 153/100 mmHg (02/22 0611) SpO2:  [96 %-98 %] 98 % (02/22 0611) Last BM Date: 04/11/13  Intake/Output from previous day: 02/21 0701 - 02/22 0700 In: 2490 [P.O.:240; I.V.:2250] Out: 1095 [Emesis/NG PPIRJJ:8841] Intake/Output this shift:    General appearance: pale and alert. Mild distress. Mental status normal. Wife and rhythm. Resp: clear to auscultation bilaterally GI: distended, tympanitic, soft, silent. Mild tenderness. No peritoneal signs.Wound is open clean, fascia intact, minimal serous drainage.  Lab Results:  Results for orders placed during the hospital encounter of 04/07/13 (from the past 24 hour(s))  CBC     Status: Abnormal   Collection Time    04/13/13  7:10 AM      Result Value Ref Range   WBC 9.0  4.0 - 10.5 K/uL   RBC 4.37  4.22 - 5.81 MIL/uL   Hemoglobin 13.6  13.0 - 17.0 g/dL   HCT 37.7 (*) 39.0 - 52.0 %   MCV 86.3  78.0 - 100.0 fL   MCH 31.1  26.0 - 34.0 pg   MCHC 36.1 (*) 30.0 - 36.0 g/dL   RDW 12.6  11.5 - 15.5 %   Platelets 335  150 - 400 K/uL  BASIC METABOLIC PANEL     Status: Abnormal   Collection Time    04/13/13  7:10 AM      Result Value Ref Range   Sodium 139  137 - 147 mEq/L   Potassium 4.2  3.7 - 5.3 mEq/L   Chloride 101  96 - 112 mEq/L   CO2 28  19 - 32 mEq/L   Glucose, Bld 146 (*) 70 - 99 mg/dL   BUN 11  6 - 23 mg/dL   Creatinine, Ser 0.95  0.50 - 1.35 mg/dL   Calcium 9.2  8.4 -  10.5 mg/dL   GFR calc non Af Amer >90  >90 mL/min   GFR calc Af Amer >90  >90 mL/min     Studies/Results: @RISRSLT24 @  . antiseptic oral rinse  15 mL Mouth Rinse q12n4p  . chlorhexidine  15 mL Mouth Rinse BID  . enoxaparin (LOVENOX) injection  40 mg Subcutaneous Q24H  . ertapenem  1 g Intravenous Q24H  . pantoprazole (PROTONIX) IV  40 mg Intravenous Q24H     Assessment/Plan: s/p Procedure(s): ATTEMPTED APPENDECTOMY LAPAROSCOPIC OPEN APPENDECTOMY PARTIAL COLECTOMY, ILEOCECTOMY  Perforated appendix  POD #5 S/p laparoscopic converted to open ileocectomy (Dr. [name] 04/07/13)  He is now more symptomatic from profound ileus versus postop SBO Reinsert NG tube/NPO Continue IV's at 125 an hour; strict Intake and output Check lab and x-ray tomorrow   -Mobilize more and IS  -lovenox, SCDs   -Antibiotics Invanz day 5/7, set to stop after 7 doses  Due to pain, will restart Dilaudid PCA.   Hypoxia - resolved  Presume UTI  -urine culture cancelled on 18th, no urinary symptoms currently  HTN  -Home meds on hold, PRN metoprolol    @PROBHOSP @  LOS: 6 days    AmeLie Hollars M 04/13/2013  . .prob

## 2013-04-14 ENCOUNTER — Inpatient Hospital Stay (HOSPITAL_COMMUNITY): Payer: 59

## 2013-04-14 LAB — COMPREHENSIVE METABOLIC PANEL
ALT: 163 U/L — ABNORMAL HIGH (ref 0–53)
AST: 78 U/L — ABNORMAL HIGH (ref 0–37)
Albumin: 2.5 g/dL — ABNORMAL LOW (ref 3.5–5.2)
Alkaline Phosphatase: 94 U/L (ref 39–117)
BUN: 9 mg/dL (ref 6–23)
CO2: 23 mEq/L (ref 19–32)
Calcium: 8.5 mg/dL (ref 8.4–10.5)
Chloride: 104 mEq/L (ref 96–112)
Creatinine, Ser: 0.95 mg/dL (ref 0.50–1.35)
GFR calc Af Amer: >90 mL/min (ref >90)
GFR calc non Af Amer: >90 mL/min (ref >90)
Glucose, Bld: 123 mg/dL — ABNORMAL HIGH (ref 70–99)
Potassium: 4.5 mEq/L (ref 3.7–5.3)
Sodium: 143 mEq/L (ref 137–147)
Total Bilirubin: 0.4 mg/dL (ref 0.3–1.2)
Total Protein: 6.1 g/dL (ref 6.0–8.3)

## 2013-04-14 LAB — CBC
HCT: 39.1 % (ref 39.0–52.0)
Hemoglobin: 13.7 g/dL (ref 13.0–17.0)
MCH: 30.5 pg (ref 26.0–34.0)
MCHC: 35 g/dL (ref 30.0–36.0)
MCV: 87.1 fL (ref 78.0–100.0)
Platelets: 367 10*3/uL (ref 150–400)
RBC: 4.49 MIL/uL (ref 4.22–5.81)
RDW: 12.6 % (ref 11.5–15.5)
WBC: 7 10*3/uL (ref 4.0–10.5)

## 2013-04-14 MED ORDER — HYDROMORPHONE HCL PF 1 MG/ML IJ SOLN
0.5000 mg | INTRAMUSCULAR | Status: DC | PRN
  Administered 2013-04-15: 0.5 mg via INTRAVENOUS
  Filled 2013-04-14: qty 1

## 2013-04-14 NOTE — Progress Notes (Addendum)
7 Days Post-Op  Subjective: Pt states he feels much better since having his NG re-inserted.  He denies any further episodes of  N/V and reports flatulence with several passings of loose to soft consistency stools.  He states he still feels a little bloated and tender, but has been able to walk a lot more. Pt states he is having less pain with dressing changes.  Objective: Vital signs in last 24 hours: Temp:  [98 F (36.7 C)-98.9 F (37.2 C)] 98 F (36.7 C) 05-01-2022 0615) Pulse Rate:  [66-72] 66 05/01/22 0615) Resp:  [12-20] 18 05/01/22 0814) BP: (147-159)/(81-99) 147/99 mmHg 05/01/22 0615) SpO2:  [95 %-99 %] 99 % 2022/05/01 0814) Last BM Date: 04/13/13  Intake/Output from previous day: 02/22 0701 - 2022/05/01 0700 In: 3250.8 [I.V.:3120.8; NG/GT:30; IV Piggyback:100] Out: 2150 [Emesis/NG output:2150] Intake/Output this shift:    PE: Gen:  Alert, NAD, pleasant Abd: Soft, with a small amount of distention and tenderness diffusely, faint BS, Wound edges well healing without discoloration or discharge.   Lab Results:   Recent Labs  04/13/13 0710  WBC 9.0  HGB 13.6  HCT 37.7*  PLT 335   BMET  Recent Labs  04/13/13 0710  NA 139  K 4.2  CL 101  CO2 28  GLUCOSE 146*  BUN 11  CREATININE 0.95  CALCIUM 9.2   PT/INR No results found for this basename: LABPROT, INR,  in the last 72 hours CMP     Component Value Date/Time   NA 139 04/13/2013 0710   K 4.2 04/13/2013 0710   CL 101 04/13/2013 0710   CO2 28 04/13/2013 0710   GLUCOSE 146* 04/13/2013 0710   BUN 11 04/13/2013 0710   CREATININE 0.95 04/13/2013 0710   CALCIUM 9.2 04/13/2013 0710   PROT 7.6 04/07/2013 1122   ALBUMIN 3.8 04/07/2013 1122   AST 17 04/07/2013 1122   ALT 25 04/07/2013 1122   ALKPHOS 88 04/07/2013 1122   BILITOT 0.9 04/07/2013 1122   GFRNONAA >90 04/13/2013 0710   GFRAA >90 04/13/2013 0710   Lipase     Component Value Date/Time   LIPASE 25 04/07/2013 1122       Studies/Results: Dg Abd 2 Views  05/01/13    CLINICAL DATA:  Follow-up small bowel obstruction versus postop ileus.  EXAM: ABDOMEN - 2 VIEW  COMPARISON:  DG ABD PORTABLE 1V dated 04/12/2013; DG ABD 1 VIEW dated 04/11/2013; CT ABD/PELVIS W CM dated 04/07/2013  FINDINGS: Nasogastric tube remains present with the tip likely in the duodenal bulb. Cholecystectomy clips in the right upper quadrant. No plain film evidence of free air. Surgical staples present in the right lower quadrant. There are persistent dilated loops of small bowel with air-fluid levels. Small bowel loops measure up to 55 mm on the upright film. Small right pleural effusion is incidentally noted. Basilar atelectasis, bilateral basilar atelectasis noted. There is little if any colonic gas identified and no gas identified within the rectum.  IMPRESSION: Unchanged dilated loops of small bowel with air-fluid levels and no colonic gas. The appearance continues to favor small bowel obstruction over ileus.   Electronically Signed   By: Dereck Ligas M.D.   On: 05/01/13 07:56   Dg Abd Portable 1v  04/12/2013   CLINICAL DATA:  Increased abdominal distention.  EXAM: PORTABLE ABDOMEN - 1 VIEW  COMPARISON:  04/11/13  FINDINGS: Moderate to severe small bowel dilatation is increased since previous study. There is also a a paucity of colonic gas. These findings  are consistent with the distal small bowel obstruction. Surgical clips noted in the right upper quadrant.  IMPRESSION: Findings consistent with worsening distal small bowel obstruction.   Electronically Signed   By: Earle Gell M.D.   On: 04/12/2013 20:36    Anti-infectives: Anti-infectives   Start     Dose/Rate Route Frequency Ordered Stop   04/08/13 1200  ertapenem (INVANZ) 1 g in sodium chloride 0.9 % 50 mL IVPB     1 g 100 mL/hr over 30 Minutes Intravenous Every 24 hours 04/08/13 1032 04/15/13 1159   04/07/13 1430  [MAR Hold]  ertapenem (INVANZ) 1 g in sodium chloride 0.9 % 50 mL IVPB     (On MAR Hold since 04/07/13 1550)   1 g 100  mL/hr over 30 Minutes Intravenous  Once 04/07/13 1424 04/07/13 1614       Assessment/Plan  POD #7 S/p laparoscopic converted to open ileocectomy (Dr. [name] 04/07/13)  -Pt reports relief since re-insertion of NG tube  -Continue NG tube/NPO. No clamp as bowel is still dilated  -Continue IV's at 125 an hour; strict Intake and output  -Xray still inconclusive between ileus vs postop SBO  -Mobilize more and IS  -lovenox, SCDs  -Antibiotics Invanz day 6/7, set to stop after 7 doses  -Switch Dilaudid PCA to IV Push Dilaudid prn. -Repeat x-ray in the morning as clinical picture does not correlate well with imaging  Hypoxia - resolved  Presume UTI -urine culture cancelled on 18th, no urinary symptoms currently  HTN - Home meds on hold, PRN metoprolol     LOS: 7 days    Michael A. Chalmers Cater, [place] 04/14/2013, 9:08 AM [facility] Phone #: [phone]  ----------------------------------------------------------------------------------------------------------- General Surgery PA Preceptor Note:  I agree with the above PA students findings as above.  Made changes as above  Coralie Keens, [facility] Pager: [phone] Office: [phone]    Pt seen and examined +stools. Still bloated. Still fair amount of bilious ng output  Alert, nad Soft, distended. approp TTP  Films - bowels still dilated. Keep NG to LIWS today.  Postop ileus slowly improving.  [name], MD, FACS General, Bariatric, & Minimally Invasive Surgery [facility], [place]

## 2013-04-15 NOTE — Progress Notes (Signed)
8 Days Post-Op  Subjective: Some flatus, not much.  Wound ok and he's tired of ice.  Less distension and discomfort.  Objective: Vital signs in last 24 hours: Temp:  [98 F (36.7 C)-98.2 F (36.8 C)] 98 F (36.7 C) (02/24 3614) Pulse Rate:  [57-67] 66 (02/24 0613) Resp:  [18-20] 18 (02/24 0613) BP: (144-156)/(74-86) 153/85 mmHg (02/24 0613) SpO2:  [96 %-97 %] 96 % (02/24 0613) Last BM Date: 04/15/13 60 PO recorded. Diet:  NPO 700 from NG No BM yesterday Afebrile,VSS, BP up some Labs OK Intake/Output from previous day: 2022/04/29 0701 - 02/24 0700 In: 3057.9 [P.O.:60; I.V.:2997.9] Out: 700 [Emesis/NG output:700] Intake/Output this shift:    General appearance: alert, cooperative and no distress Resp: clear to auscultation bilaterally GI: soft sore not very distended, he has a few BS, but still pretty quied.  Open wound 04/15/13  Lab Results:   Recent Labs  04/13/13 0710 04/29/2013 0645  WBC 9.0 7.0  HGB 13.6 13.7  HCT 37.7* 39.1  PLT 335 367    BMET  Recent Labs  04/13/13 0710 29-Apr-2013 0910  NA 139 143  K 4.2 4.5  CL 101 104  CO2 28 23  GLUCOSE 146* 123*  BUN 11 9  CREATININE 0.95 0.95  CALCIUM 9.2 8.5   PT/INR No results found for this basename: LABPROT, INR,  in the last 72 hours   Recent Labs Lab 2013-04-29 0910  AST 78*  ALT 163*  ALKPHOS 94  BILITOT 0.4  PROT 6.1  ALBUMIN 2.5*     Lipase     Component Value Date/Time   LIPASE 25 04/07/2013 1122     Studies/Results: Dg Abd 2 Views  April 29, 2013   CLINICAL DATA:  Follow-up small bowel obstruction versus postop ileus.  EXAM: ABDOMEN - 2 VIEW  COMPARISON:  DG ABD PORTABLE 1V dated 04/12/2013; DG ABD 1 VIEW dated 04/11/2013; CT ABD/PELVIS W CM dated 04/07/2013  FINDINGS: Nasogastric tube remains present with the tip likely in the duodenal bulb. Cholecystectomy clips in the right upper quadrant. No plain film evidence of free air. Surgical staples present in the right lower quadrant. There are  persistent dilated loops of small bowel with air-fluid levels. Small bowel loops measure up to 55 mm on the upright film. Small right pleural effusion is incidentally noted. Basilar atelectasis, bilateral basilar atelectasis noted. There is little if any colonic gas identified and no gas identified within the rectum.  IMPRESSION: Unchanged dilated loops of small bowel with air-fluid levels and no colonic gas. The appearance continues to favor small bowel obstruction over ileus.   Electronically Signed   By: Dereck Ligas M.D.   On: 04/29/13 07:56    Medications: . antiseptic oral rinse  15 mL Mouth Rinse q12n4p  . chlorhexidine  15 mL Mouth Rinse BID  . enoxaparin (LOVENOX) injection  40 mg Subcutaneous Q24H  . pantoprazole (PROTONIX) IV  40 mg Intravenous Q24H   . dextrose 5 % and 0.45 % NaCl with KCl 20 mEq/L 125 mL/hr at 04/15/13 4315   Prior to Admission medications   Medication Sig Start Date End Date Taking? Authorizing Provider  amLODipine (NORVASC) 10 MG tablet Take 1 tablet (10 mg total) by mouth daily. 11/07/12 11/07/13 Yes Marletta Lor, MD  aspirin EC 81 MG tablet Take 81 mg by mouth daily.   Yes Historical Provider, MD  cetirizine (ZYRTEC) 10 MG tablet Take 10 mg by mouth daily.     Yes Historical Provider, MD  fluticasone (FLONASE) 50 MCG/ACT nasal spray Place 2 sprays into the nose daily as needed. For allergies. 05/07/12  Yes Marletta Lor, MD  ibuprofen (ADVIL,MOTRIN) 200 MG tablet Take 600 mg by mouth every 6 (six) hours as needed for pain.   Yes Historical Provider, MD  lisinopril (PRINIVIL,ZESTRIL) 40 MG tablet Take 40 mg by mouth daily.   Yes Historical Provider, MD  magnesium oxide (MAG-OX) 400 MG tablet Take 400 mg by mouth daily.   Yes Historical Provider, MD  Multiple Vitamin (MULTIVITAMIN) tablet Take 1 tablet by mouth daily.    Yes Historical Provider, MD     Assessment/Plan Ruptured appendix with Attempted laparoscopic appendectomy, Open ileocecectomy  with appendectomy. Hx of hypertension  Hx of bradycardia Dyslipidemia S/p cholecystectomy   Plan:  Continue to mobilize, ice chips and sips, NG clamping trial and continue to ambulate.  Wound vac to open wound.     LOS: 8 days    Akul Leggette 04/15/2013

## 2013-04-15 NOTE — Progress Notes (Signed)
INITIAL NUTRITION ASSESSMENT  DOCUMENTATION CODES Per approved criteria  -Not Applicable   INTERVENTION: - Advance diet as medically tolerated - If diet cannot be advanced, recommend initiation of TPN per pharmacy  NUTRITION DIAGNOSIS: Inadequate oral intake related to inability to eat as evidenced by NPO.   Goal: Pt to meet >/= 90% of their estimated nutrition needs  Monitor:  Advancement of diet, need for TPN, weight, I/O's  Reason for Assessment: no PO x 8 days  58 y.o. male  Admitting Dx: <principal problem not specified>  ASSESSMENT: 58 yo male with a h/o HTN and Birt-Hogg Dube syndrome who began having diffuse abdominal pain around noon on Saturday. It worsened that night with a subjective fever. It continued into Sunday with N/V. Due to persistent pain, he presented to the [facility] today for further evaluation. He was found to have acute perforated appendicitis on a CT scan. His WBC was 15.8K  2/17- pt underwent open appendectomy, partial colectomy, and ileocectomy 2/20- NG tube removed, clears started 2/22- Several episodes of emesis, passing minimal stools, no flatus, NG tube reinserted 2/24- has been having loose to soft stools and has reported flatulence, NG clamping trials  Pt is allowed ice chips and sip as well as [place] ice which pt is tolerating. Pt reports flatus. Pt reports no wt loss prior to admission, but suspects wt loss since being admitted to hospital. No weights recorded on file, but suspect some form of malnutrition in the context of acute illness or injury. Pt does not show signs of fat and muscle wasting.  Height: Ht Readings from Last 1 Encounters:  04/07/13 5\' 11"  (1.803 m)    Weight: Wt Readings from Last 1 Encounters:  04/07/13 217 lb 6 oz (98.6 kg)    Ideal Body Weight: 75.3 kg  % Ideal Body Weight: unknown  Wt Readings from Last 10 Encounters:  04/07/13 217 lb 6 oz (98.6 kg)  04/07/13 217 lb 6 oz (98.6 kg)  11/07/12 216 lb (97.977 kg)   05/07/12 202 lb (91.627 kg)  01/05/12 206 lb (93.441 kg)  12/29/11 204 lb (92.534 kg)  11/14/11 204 lb (92.534 kg)  11/09/11 195 lb (88.451 kg)  10/17/11 200 lb (90.719 kg)  10/03/11 197 lb (89.359 kg)    Usual Body Weight: 210-214 lbs  % Usual Body Weight: unknown  BMI:  Body mass index is 30.33 kg/(m^2).  Estimated Nutritional Needs: Kcal: 6389-3734 Protein: 120-130 g Fluid: 2.5-2.7 L/day  Skin: incision on abdomen  Diet Order: NPO  EDUCATION NEEDS: -Education not appropriate at this time   Intake/Output Summary (Last 24 hours) at 04/15/13 1130 Last data filed at 04/15/13 0559  Gross per 24 hour  Intake 3057.92 ml  Output    700 ml  Net 2357.92 ml    Last BM: 2/23   Labs:   Recent Labs Lab 04/11/13 0312 04/13/13 0710 04/14/13 0910  NA 138 139 143  K 4.2 4.2 4.5  CL 100 101 104  CO2 28 28 23   BUN 12 11 9   CREATININE 0.97 0.95 0.95  CALCIUM 8.5 9.2 8.5  MG 2.2  --   --   GLUCOSE 135* 146* 123*    CBG (last 3)  No results found for this basename: GLUCAP,  in the last 72 hours  Scheduled Meds: . antiseptic oral rinse  15 mL Mouth Rinse q12n4p  . chlorhexidine  15 mL Mouth Rinse BID  . enoxaparin (LOVENOX) injection  40 mg Subcutaneous Q24H  . pantoprazole (PROTONIX)  IV  40 mg Intravenous Q24H    Continuous Infusions: . dextrose 5 % and 0.45 % NaCl with KCl 20 mEq/L 125 mL/hr at 04/15/13 2683    Past Medical History  Diagnosis Date  . Allergic rhinitis   . Hypertension   . Hx of colonic polyps   . Hyperlipidemia   . Bradycardia   . Dyslipidemia (high LDL; low HDL)   . Birt-Hogg-Dube syndrome   . Anginal pain   . Spontaneous pneumothorax 1995  . Adrenal mass     "benign" (04/08/2013  . History of blood transfusion ?1995  . Arthritis     "probably in my fingers" (04/08/2013)  . Basal cell carcinoma 2000's    'chest"    Past Surgical History  Procedure Laterality Date  . External ear surgery  1971  . Colonoscopy  06/2005  .  Cardiolyte stress  04/2008  . Tendon repair Left 1990's    "thumb"  . Elbow surgery Left 2001    "tendonitis"  . Knee arthroscopy Left 1990's  . Appendectomy  04/07/2013  . Cholecystectomy  2004  . Lung surgery  1995    "repaired ruptured bleb" (04/08/2013)  . Nasal septum surgery    . Middle ear surgery Left 1972    exploratory tympanotomy/notes 07/17/2000  . Skin cancer excision  2000's    "chest"  . Laparoscopic appendectomy N/A 04/07/2013    Procedure: ATTEMPTED APPENDECTOMY LAPAROSCOPIC;  Surgeon: Gayland Curry, MD;  Location: [facility];  Service: General;  Laterality: N/A;  . Appendectomy  04/07/2013    Procedure: OPEN APPENDECTOMY;  Surgeon: Gayland Curry, MD;  Location: [place];  Service: General;;  . Partial colectomy  04/07/2013    Procedure: PARTIAL COLECTOMY, ILEOCECTOMY;  Surgeon: Gayland Curry, MD;  Location: [place];  Service: General;;    Terrace Arabia RD, LDN

## 2013-04-15 NOTE — Progress Notes (Signed)
Flatus; small amounts of diarrhea. Was clamped for four hours - no nausea or distention Abdomen flat, soft VAC in place  Will clamp NG overnight.  If no problems, will d/c in AM.  Imogene Burn. Georgette Dover, MD, [facility] Surgery  General/ Trauma Surgery  04/15/2013 3:28 PM

## 2013-04-15 NOTE — Consult Note (Signed)
WOC wound consult note Reason for Consult: Apply NPWT (Vac) to open abdominal wound.  Wound type: Surgical wound s/p ruptured appendix Measurement: Midline abdominal incision, in two separate sections.  Upper wound 6 cm x 4 cm x 2 cm; lower wound 11cm x 5 cm x 3 cm.   Wound bed: 100% beefy red.  Drainage (amount, consistency, odor) minimal serosanguinous.  Periwound: Intact.  Dressing procedure/placement/frequency: Cleanse abdominal wound with NS and pat gently dry. Prep periwound skin with skin prep.  Apply Granufoam (black foam) to each wound bed, ensuring that the 2 pieces of foam are touching each other. Cover with drape. Change Tues-Thurs-Sat.  Will not follow at this time.  Please re-consult if needed.  Domenic Moras RN BSN [place] Pager [phone]

## 2013-04-15 NOTE — Progress Notes (Signed)
Came to visit patient on behalf of [employer] to [employer] program for [employer] employees/dependents with [facility] employees. Declines having any Link to Wellness needs at this time. Agreeable to post hospital discharge call however. Left brochure and contact information at bedside.  Linden Dolin, RN,BSN- [facility] [phone]

## 2013-04-16 MED ORDER — FLUTICASONE PROPIONATE 50 MCG/ACT NA SUSP: 2.0000 | Freq: Every day | NASAL | Status: DC | PRN

## 2013-04-16 MED ORDER — LISINOPRIL 5 MG PO TABS
5.0000 mg | ORAL_TABLET | Freq: Every day | ORAL | Status: DC
  Administered 2013-04-16 – 2013-04-17 (×2): 5 mg via ORAL
  Filled 2013-04-16 (×2): qty 1

## 2013-04-16 MED ORDER — AMLODIPINE BESYLATE 5 MG PO TABS
5.0000 mg | ORAL_TABLET | Freq: Every day | ORAL | Status: DC
  Administered 2013-04-16 – 2013-04-17 (×2): 5 mg via ORAL
  Filled 2013-04-16 (×2): qty 1

## 2013-04-16 MED ORDER — ACETAMINOPHEN 325 MG PO TABS: 650.0000 mg | ORAL_TABLET | Freq: Four times a day (QID) | ORAL | Status: DC | PRN

## 2013-04-16 MED ORDER — OXYCODONE-ACETAMINOPHEN 5-325 MG PO TABS: 1.0000 | ORAL_TABLET | ORAL | Status: DC | PRN

## 2013-04-16 MED ORDER — IBUPROFEN 600 MG PO TABS: 600.0000 mg | ORAL_TABLET | Freq: Four times a day (QID) | ORAL | Status: DC | PRN

## 2013-04-16 NOTE — Progress Notes (Signed)
9 Days Post-Op  Subjective: Multiple BM's yesterday.  Walking, having flatus.    Objective: Vital signs in last 24 hours: Temp:  [97.6 F (36.4 C)-98.8 F (37.1 C)] 97.6 F (36.4 C) (02/25 0552) Pulse Rate:  [60-65] 64 (02/25 0552) Resp:  [17-18] 17 (02/25 0552) BP: (146-159)/(80-88) 159/88 mmHg (02/25 0552) SpO2:  [95 %-97 %] 95 % (02/25 0552) Last BM Date: 04/08/13 60 po recorded. 7 stools recorded. Afebrile, VSS   BP up some Intake/Output from previous day: 02/24 0701 - 02/25 0700 In: 1127.1 [I.V.:1127.1] Out: 400 [Emesis/NG output:400] Intake/Output this shift:    General appearance: alert, cooperative and no distress Resp: clear to auscultation bilaterally GI: soft, + BS, wound vac in place.  having multiple BM yesterday.  Lab Results:   Recent Labs  04/14/13 0645  WBC 7.0  HGB 13.7  HCT 39.1  PLT 367    BMET  Recent Labs  04/14/13 0910  NA 143  K 4.5  CL 104  CO2 23  GLUCOSE 123*  BUN 9  CREATININE 0.95  CALCIUM 8.5   PT/INR No results found for this basename: LABPROT, INR,  in the last 72 hours   Recent Labs Lab 04/14/13 0910  AST 78*  ALT 163*  ALKPHOS 94  BILITOT 0.4  PROT 6.1  ALBUMIN 2.5*     Lipase     Component Value Date/Time   LIPASE 25 04/07/2013 1122     Studies/Results: No results found.  Medications: . antiseptic oral rinse  15 mL Mouth Rinse q12n4p  . chlorhexidine  15 mL Mouth Rinse BID  . enoxaparin (LOVENOX) injection  40 mg Subcutaneous Q24H  . pantoprazole (PROTONIX) IV  40 mg Intravenous Q24H   Prior to Admission medications   Medication Sig Start Date End Date Taking? Authorizing Provider  amLODipine (NORVASC) 10 MG tablet Take 1 tablet (10 mg total) by mouth daily. 11/07/12 11/07/13 Yes Marletta Lor, MD  aspirin EC 81 MG tablet Take 81 mg by mouth daily.   Yes Historical Provider, MD  cetirizine (ZYRTEC) 10 MG tablet Take 10 mg by mouth daily.     Yes Historical Provider, MD  fluticasone (FLONASE)  50 MCG/ACT nasal spray Place 2 sprays into the nose daily as needed. For allergies. 05/07/12  Yes Marletta Lor, MD  ibuprofen (ADVIL,MOTRIN) 200 MG tablet Take 600 mg by mouth every 6 (six) hours as needed for pain.   Yes Historical Provider, MD  lisinopril (PRINIVIL,ZESTRIL) 40 MG tablet Take 40 mg by mouth daily.   Yes Historical Provider, MD  magnesium oxide (MAG-OX) 400 MG tablet Take 400 mg by mouth daily.   Yes Historical Provider, MD  Multiple Vitamin (MULTIVITAMIN) tablet Take 1 tablet by mouth daily.    Yes Historical Provider, MD    Assessment/Plan Ruptured appendix with Attempted laparoscopic appendectomy, Open ileocecectomy with appendectomy.  Hx of hypertension  Hx of bradycardia  Dyslipidemia  S/p cholecystectomy   Plan:  D/c NG, clears, continue to mobilize. Decrease IV rate and restart some of his BP medicines.   LOS: 9 days    Juwon Scripter 04/16/2013

## 2013-04-16 NOTE — Progress Notes (Signed)
Ileus resolved Several bowel movements Will advance diet - po pain meds Hopefully home tomorrow.  Imogene Burn. Georgette Dover, MD, [facility] Surgery  General/ Trauma Surgery  04/16/2013 10:12 AM

## 2013-04-17 ENCOUNTER — Encounter (HOSPITAL_COMMUNITY): Payer: Self-pay | Admitting: General Surgery

## 2013-04-17 DIAGNOSIS — K9189 Other postprocedural complications and disorders of digestive system: Secondary | ICD-10-CM

## 2013-04-17 DIAGNOSIS — K567 Ileus, unspecified: Secondary | ICD-10-CM

## 2013-04-17 HISTORY — DX: Other postprocedural complications and disorders of digestive system: K56.7

## 2013-04-17 HISTORY — DX: Other postprocedural complications and disorders of digestive system: K91.89

## 2013-04-17 LAB — BASIC METABOLIC PANEL
BUN: 14 mg/dL (ref 6–23)
CO2: 24 meq/L (ref 19–32)
Calcium: 9.6 mg/dL (ref 8.4–10.5)
Chloride: 102 mEq/L (ref 96–112)
Creatinine, Ser: 1.21 mg/dL (ref 0.50–1.35)
GFR calc Af Amer: 75 mL/min — ABNORMAL LOW (ref >90)
GFR calc non Af Amer: 64 mL/min — ABNORMAL LOW (ref >90)
GLUCOSE: 110 mg/dL — AB (ref 70–99)
POTASSIUM: 4.5 meq/L (ref 3.7–5.3)
Sodium: 140 mEq/L (ref 137–147)

## 2013-04-17 LAB — CBC
HCT: 41.9 % (ref 39.0–52.0)
HEMOGLOBIN: 15.1 g/dL (ref 13.0–17.0)
MCH: 31.5 pg (ref 26.0–34.0)
MCHC: 36 g/dL (ref 30.0–36.0)
MCV: 87.3 fL (ref 78.0–100.0)
Platelets: 419 10*3/uL — ABNORMAL HIGH (ref 150–400)
RBC: 4.8 MIL/uL (ref 4.22–5.81)
RDW: 12.8 % (ref 11.5–15.5)
WBC: 10.6 10*3/uL — ABNORMAL HIGH (ref 4.0–10.5)

## 2013-04-17 LAB — CLOSTRIDIUM DIFFICILE BY PCR: Toxigenic C. Difficile by PCR: NEGATIVE

## 2013-04-17 MED ORDER — ACETAMINOPHEN 325 MG PO TABS: 650.0000 mg | ORAL_TABLET | Freq: Four times a day (QID) | ORAL | Status: DC | PRN

## 2013-04-17 MED ORDER — SACCHAROMYCES BOULARDII 250 MG PO CAPS
250.0000 mg | ORAL_CAPSULE | Freq: Two times a day (BID) | ORAL | Status: DC
  Administered 2013-04-17: 250 mg via ORAL
  Filled 2013-04-17 (×2): qty 1

## 2013-04-17 MED ORDER — SACCHAROMYCES BOULARDII 250 MG PO CAPS: ORAL_CAPSULE | ORAL | Status: DC

## 2013-04-17 MED ORDER — OXYCODONE-ACETAMINOPHEN 5-325 MG PO TABS: 1.0000 | ORAL_TABLET | ORAL | Status: DC | PRN

## 2013-04-17 MED ORDER — IBUPROFEN 200 MG PO TABS: ORAL_TABLET | ORAL | Status: DC

## 2013-04-17 NOTE — Discharge Summary (Signed)
Physician Discharge Summary  Patient ID: CURLY MACKOWSKI MRN: [id number] DOB/AGE: 03/21/1955 58 y.o.  Admit date: 04/07/2013 Discharge date: 04/17/2013  Admission Diagnoses:  Ruptured appendicitis. Birt-Hogg Dube syndrome Hx of hypertension  Hx of bradycardia  Dyslipidemia  S/p cholecystectomy     Discharge Diagnoses:  Ruptured appendix  Post op ileus Birt-Hogg Dube syndrome Hx of hypertension  Hx of bradycardia  Dyslipidemia  S/p cholecystectomy  Hx of tobacco use   Principal Problem:   Ruptured appendicitis Active Problems:   Ileus, postoperative   HYPERTENSION   PROCEDURES: 1. Attempted laparoscopic appendectomy. 2. Open ileocecectomy with appendectomy.  SURGEON: [name], MD, FACS,   04/08/13.   Hospital Course: [name] with a h/o HTN and Birt-Hogg Dube syndrome who began having diffuse abdominal pain around noon on Saturday. It worsened that night with a subjective fever. It continued into Sunday with N/V. Due to persistent pain, he presented to the [facility] today for further evaluation. He was found to have acute perforated appendicitis on a CT scan. His WBC was 15.8K. He denies any other issues. He was seen in the ER and taken to the OR after evaluation by Dr. [name].   Findings:  The patient did not have allot of contamination, however his appendix was no longer attached to the base of the cecum, it was only dangling by 2-3 mm thread. Given the surrounding inflammation and  induration of the cecum and the terminal ileum, I did not feel that stapling just barely across the cecum was a viable option, therefore I converted to an open procedure to do ileocecectomy. Post op he was kept on IV antibiotics, he had a post op ileus.  His diet was slowly advanced.  His open wound was packed initially, we placed a wound vac on it.  Unfortunately they did not shave/clip him prior so the first dressing change was terrible.  We are going back to wet to dry dressings at  discharge. He was having diarrhea and C diff was negative.  Colbert Ewing was stopped after 8 days.  He was sent home on the 9th day.   Follow up with Dr. [name] in 2 weeks.   Disposition: 01-Home or Self Care       Future Appointments Provider Department Dept Phone   05/05/2013 8:15 AM [facility] [place] at [place]   05/12/2013 8:45 AM Marletta Lor, MD [place] at [place]       Medication List         acetaminophen 325 MG tablet  Commonly known as:  TYLENOL  Take 2 tablets (650 mg total) by mouth every 6 (six) hours as needed for mild pain, moderate pain, fever or headache.     amLODipine 10 MG tablet  Commonly known as:  NORVASC  Take 1 tablet (10 mg total) by mouth daily.     aspirin EC 81 MG tablet  Take 81 mg by mouth daily.     cetirizine 10 MG tablet  Commonly known as:  ZYRTEC  Take 10 mg by mouth daily.     fluticasone 50 MCG/ACT nasal spray  Commonly known as:  FLONASE  Place 2 sprays into the nose daily as needed. For allergies.     ibuprofen 200 MG tablet  Commonly known as:  ADVIL,MOTRIN  You can take 2-3 of these every 6 hours as needed.     lisinopril 40 MG tablet  Commonly known as:  PRINIVIL,ZESTRIL  Take  40 mg by mouth daily.     magnesium oxide 400 MG tablet  Commonly known as:  MAG-OX  Take 400 mg by mouth daily.     multivitamin tablet  Take 1 tablet by mouth daily.     oxyCODONE-acetaminophen 5-325 MG per tablet  Commonly known as:  PERCOCET/ROXICET  Take 1-2 tablets by mouth every 4 (four) hours as needed for moderate pain.     saccharomyces boulardii 250 MG capsule  Commonly known as:  FLORASTOR  - Take 1 twice a day till you are completely back to normal.  - You can buy this over the counter.       Follow-up Information   Follow up with Gayland Curry, MD. Schedule an appointment as soon as possible for a visit in 2 weeks.   Specialty:  General Surgery   Contact information:    [address] [place] [address] [phone]       Signed: Earnstine Regal 04/17/2013, 12:29 PM

## 2013-04-17 NOTE — Progress Notes (Signed)
Will rule out C diff. Remove VAC today. Possibly ready for discharge later today.  Imogene Burn. Georgette Dover, MD, [facility] Surgery  General/ Trauma Surgery  04/17/2013 10:47 AM

## 2013-04-17 NOTE — Progress Notes (Signed)
Discharge instructions reviewed with patient, questions answered, verbalized understanding.  Patient given written prescription for pain medication.  Patient ambulatory to front of hospital to be taken home by family.  Patient in good condition at time of discharge from [facility]

## 2013-04-17 NOTE — Progress Notes (Signed)
10 Days Post-Op  Subjective: He is doing well having multiple loose stools yesterday.  He is blaming it on liquids.   Objective: Vital signs in last 24 hours: Temp:  [97.6 F (36.4 C)-98.3 F (36.8 C)] 98.3 F (36.8 C) (02/26 0531) Pulse Rate:  [65-70] 65 (02/26 0531) Resp:  [17-18] 17 (02/26 0531) BP: (125-147)/(75-83) 129/75 mmHg (02/26 0531) SpO2:  [95 %-99 %] 95 % (02/26 0531) Last BM Date: 04/15/13 7 stools recorded 2/24,  None yesterday. Afebrile, VSS Labs OK, last WBC was 2/23 Intake/Output from previous day: 02/25 0701 - 02/26 0700 In: 480 [P.O.:480] Out: -  Intake/Output this shift:    General appearance: alert, cooperative and no distress Resp: clear to auscultation bilaterally GI: soft wound vac in place, multiple loose stools.  Lab Results:   Recent Labs  04/17/13 0833  WBC 10.6*  HGB 15.1  HCT 41.9  PLT 419*    BMET  Recent Labs  04/17/13 0833  NA 140  K 4.5  CL 102  CO2 24  GLUCOSE 110*  BUN 14  CREATININE 1.21  CALCIUM 9.6   PT/INR No results found for this basename: LABPROT, INR,  in the last 72 hours   Recent Labs Lab 04/14/13 0910  AST 78*  ALT 163*  ALKPHOS 94  BILITOT 0.4  PROT 6.1  ALBUMIN 2.5*     Lipase     Component Value Date/Time   LIPASE 25 04/07/2013 1122     Studies/Results: No results found.  Medications: . amLODipine  5 mg Oral Daily  . antiseptic oral rinse  15 mL Mouth Rinse q12n4p  . chlorhexidine  15 mL Mouth Rinse BID  . enoxaparin (LOVENOX) injection  40 mg Subcutaneous Q24H  . lisinopril  5 mg Oral Daily  . pantoprazole (PROTONIX) IV  40 mg Intravenous Q24H  . saccharomyces boulardii  250 mg Oral BID    Assessment/Plan Ruptured appendix with Attempted laparoscopic appendectomy, Open ileocecectomy with appendectomy.  Hx of hypertension  Hx of bradycardia  Dyslipidemia  S/p cholecystectomy   Plan:  Take down wound vac, recheck labs, we stopped Invanz but he has had 8 days of this.   Advance diet.  I will check C diff, I doubt it is + but we will check it just to be safe.  Add probiotic.  If labs and C dif OK, wound  And PO's OK may get home later today.  WBC is up some today.  LOS: 10 days    Ernest Hudson 04/17/2013

## 2013-04-17 NOTE — Discharge Summary (Signed)
Ernest Blixt K. Milos Milligan, MD, FACS [place] Surgery  General/ Trauma Surgery  04/17/2013 1:28 PM  

## 2013-04-17 NOTE — Discharge Instructions (Signed)

## 2013-04-29 ENCOUNTER — Encounter: Payer: Self-pay | Admitting: Cardiology

## 2013-05-02 ENCOUNTER — Telehealth: Payer: Self-pay | Admitting: Internal Medicine

## 2013-05-02 NOTE — Telephone Encounter (Signed)
Cancelled pt's lab for Mon 3/16 due to staffing issues.  Pt wants to know should he cancel his cpx for 3/23 due to a ruptured appendix that he is still being treated for.  Pt states he is up and moving but not back to work yet, he cannot lift over 10 pounds, just needs advice on whether or not to keep cpx appt.  Please advise. Thanks

## 2013-05-05 ENCOUNTER — Other Ambulatory Visit: Payer: 59

## 2013-05-05 NOTE — Telephone Encounter (Signed)
Spoke to pt told him okay to keep physical appointment as long as he is up to it and we can do his labs after seeing Dr. Raliegh Ip. Pt verbalized understanding.

## 2013-05-08 ENCOUNTER — Encounter (INDEPENDENT_AMBULATORY_CARE_PROVIDER_SITE_OTHER): Payer: Self-pay | Admitting: General Surgery

## 2013-05-08 ENCOUNTER — Ambulatory Visit (INDEPENDENT_AMBULATORY_CARE_PROVIDER_SITE_OTHER): Payer: Commercial Managed Care - PPO | Admitting: General Surgery

## 2013-05-08 VITALS — BP 122/74 | HR 72 | Temp 97.1°F | Resp 16 | Ht 71.0 in | Wt 202.0 lb

## 2013-05-08 DIAGNOSIS — Z09 Encounter for follow-up examination after completed treatment for conditions other than malignant neoplasm: Secondary | ICD-10-CM

## 2013-05-08 NOTE — Progress Notes (Signed)
Subjective:     Patient ID: Ernest Hudson, male   DOB: 02-01-1956, 58 y.o.   MRN: [id number]  HPI 58 year old Caucasian male comes in for followup after undergoing emergent expiratory laparotomy for ruptured appendicitis and therefore he 16th. His appendix was necrotic and it was unable to be done laparoscopic. He underwent open ileocecectomy. He had a prolonged ileus. He was discharged from the hospital ton 2/26. He states that his stools were loose up until about 2 days ago. He is now having formed stools. He denies any fever, chills, nausea, vomiting, constipation. He denies any difficulty urinating. He reports his appetite is really good. However his energy has not returned to baseline.  Review of Systems     Objective:   Physical Exam BP 122/74  Pulse 72  Temp(Src) 97.1 F (36.2 C) (Temporal)  Resp 16  Ht 5\' 11"  (1.803 m)  Wt 202 lb (91.627 kg)  BMI 28.19 kg/m2 Alert, no apparent distress Lungs are clear Regular rate and rhythm Abdomen-soft, nontender, nondistended. Almost completely healed midline incision. The upper aspect of the incision is still separated by about 2 mm. Depth is about 1 mm. He had a little bit of granulation tissue in his lower midline. No cellulitis. No evidence of incisional hernia     Assessment:     Status post ileocecectomy for ruptured necrotic appendicitis     Plan:     We reviewed his pathology report and he was given a copy which showed benign colon with small bowel serositis and a ruptured appendix with necrosis. No evidence of malignancy. I explained that his energy level will take a few more weeks to normalize. We discussed the importance of a well-balanced diet. I silver nitrated the area of granulation tissue. At this point he should just cover the area with a dry gauze. I reminded him that he should not do any heavy lifting for another 2 weeks. Followup in about 8 weeks for final wound check.   [name], MD, FACS General, Bariatric, &  Minimally Invasive Surgery [facility] Surgery, [place]

## 2013-05-08 NOTE — Patient Instructions (Signed)
You should avoid heavy lifting for another 2 weeks.

## 2013-05-12 ENCOUNTER — Ambulatory Visit (INDEPENDENT_AMBULATORY_CARE_PROVIDER_SITE_OTHER): Payer: 59 | Admitting: Internal Medicine

## 2013-05-12 ENCOUNTER — Encounter: Payer: Self-pay | Admitting: Internal Medicine

## 2013-05-12 VITALS — BP 120/70 | HR 55 | Temp 98.5°F | Resp 20 | Ht 70.5 in | Wt 200.0 lb

## 2013-05-12 DIAGNOSIS — I10 Essential (primary) hypertension: Secondary | ICD-10-CM

## 2013-05-12 DIAGNOSIS — Z86010 Personal history of colonic polyps: Secondary | ICD-10-CM

## 2013-05-12 DIAGNOSIS — Z0000 Encounter for general adult medical examination without abnormal findings: Secondary | ICD-10-CM

## 2013-05-12 DIAGNOSIS — E785 Hyperlipidemia, unspecified: Secondary | ICD-10-CM

## 2013-05-12 NOTE — Progress Notes (Signed)
Pre-visit discussion using our clinic review tool. No additional management support is needed unless otherwise documented below in the visit note.  

## 2013-05-12 NOTE — Progress Notes (Signed)
Subjective:    Patient ID: Ernest Hudson, male    DOB: 04/12/1955, 58 y.o.   MRN: [id number]  HPI  57 year old patient who is seen today for a health maintenance exam.  He has a history of hypertension, allergic rhinitis, and does quite well. He is a former smoker and discontinued tobacco products in June of 2014 He has had a recent followup by general surgery following ruptured appendicitis.  Last month.  Doing quite well postoperatively  Past Medical History  Diagnosis Date  . Allergic rhinitis   . Hypertension   . Hx of colonic polyps   . Hyperlipidemia   . Bradycardia   . Dyslipidemia (high LDL; low HDL)   . Birt-Hogg-Dube syndrome   . Anginal pain   . Spontaneous pneumothorax 1995  . Adrenal mass     "benign" (04/08/2013  . History of blood transfusion ?1995  . Arthritis     "probably in my fingers" (04/08/2013)  . Basal cell carcinoma 2000's    'chest"  . Ileus, postoperative 04/17/2013    History   Social History  . Marital Status: Married    Spouse Name: N/A    Number of Children: N/A  . Years of Education: N/A   Occupational History  . Not on file.   Social History Main Topics  . Smoking status: Former Smoker -- 2.00 packs/day for 30 years    Types: Cigarettes    Quit date: 07/30/2012  . Smokeless tobacco: Never Used  . Alcohol Use: No  . Drug Use: No  . Sexual Activity: Not Currently   Other Topics Concern  . Not on file   Social History Narrative  . No narrative on file    Past Surgical History  Procedure Laterality Date  . External ear surgery  1971  . Colonoscopy  06/2005  . Cardiolyte stress  04/2008  . Tendon repair Left 1990's    "thumb"  . Elbow surgery Left 2001    "tendonitis"  . Knee arthroscopy Left 1990's  . Appendectomy  04/07/2013  . Cholecystectomy  2004  . Lung surgery  1995    "repaired ruptured bleb" (04/08/2013)  . Nasal septum surgery    . Middle ear surgery Left 1972    exploratory tympanotomy/notes 07/17/2000  . Skin  cancer excision  2000's    "chest"  . Laparoscopic appendectomy N/A 04/07/2013    Procedure: ATTEMPTED APPENDECTOMY LAPAROSCOPIC;  Surgeon: Gayland Curry, MD;  Location: [facility];  Service: General;  Laterality: N/A;  . Appendectomy  04/07/2013    Procedure: OPEN APPENDECTOMY;  Surgeon: Gayland Curry, MD;  Location: [facility];  Service: General;;  . Partial colectomy  04/07/2013    Procedure: PARTIAL COLECTOMY, ILEOCECTOMY;  Surgeon: Gayland Curry, MD;  Location: [facility];  Service: General;;    Family History  Problem Relation Age of Onset  . Cancer Mother     breast  . Diabetes Mother   . Heart attack Father   . Diabetes Father   . Cancer Father     prostate   . Healthy Sister   . Lung disease Sister     has blebs on MRI,   . Healthy Brother   . Healthy Brother   . Breast cancer Maternal Aunt     diagnosed in her 49s  . Melanoma Maternal Uncle   . Breast cancer Maternal Aunt     diagnosed in her 59s  . Breast cancer Maternal Aunt  diagnosed in her 97s  . Cancer Cousin     multiple myeloma    Allergies  Allergen Reactions  . Niacin     REACTION: rash    Current Outpatient Prescriptions on File Prior to Visit  Medication Sig Dispense Refill  . amLODipine (NORVASC) 10 MG tablet Take 1 tablet (10 mg total) by mouth daily.  90 tablet  1  . aspirin EC 81 MG tablet Take 81 mg by mouth daily.      . cetirizine (ZYRTEC) 10 MG tablet Take 10 mg by mouth daily.        . fluticasone (FLONASE) 50 MCG/ACT nasal spray Place 2 sprays into the nose daily as needed. For allergies.  16 g  6  . ibuprofen (ADVIL,MOTRIN) 200 MG tablet You can take 2-3 of these every 6 hours as needed.  30 tablet  0  . lisinopril (PRINIVIL,ZESTRIL) 40 MG tablet Take 40 mg by mouth daily.      . magnesium oxide (MAG-OX) 400 MG tablet Take 400 mg by mouth daily.      . Multiple Vitamin (MULTIVITAMIN) tablet Take 1 tablet by mouth daily.        No current facility-administered medications on file prior to visit.     BP 120/70  Pulse 55  Temp(Src) 98.5 F (36.9 C) (Oral)  Resp 20  Ht 5' 10.5" (1.791 m)  Wt 200 lb (90.719 kg)  BMI 28.28 kg/m2  SpO2 98%     Review of Systems  Constitutional: Negative for fever, chills, appetite change and fatigue.  HENT: Negative for congestion, dental problem, ear pain, hearing loss, sore throat, tinnitus, trouble swallowing and voice change.   Eyes: Negative for pain, discharge and visual disturbance.  Respiratory: Negative for cough, chest tightness, wheezing and stridor.   Cardiovascular: Negative for chest pain, palpitations and leg swelling.  Gastrointestinal: Negative for nausea, vomiting, abdominal pain, diarrhea, constipation, blood in stool and abdominal distention.  Genitourinary: Negative for urgency, hematuria, flank pain, discharge, difficulty urinating and genital sores.  Musculoskeletal: Negative for arthralgias, back pain, gait problem, joint swelling, myalgias and neck stiffness.  Skin: Negative for rash.  Neurological: Negative for dizziness, syncope, speech difficulty, weakness, numbness and headaches.  Hematological: Negative for adenopathy. Does not bruise/bleed easily.  Psychiatric/Behavioral: Negative for behavioral problems and dysphoric mood. The patient is not nervous/anxious.        Objective:   Physical Exam  Constitutional: He appears well-developed and well-nourished.  HENT:  Head: Normocephalic and atraumatic.  Right Ear: External ear normal.  Left Ear: External ear normal.  Nose: Nose normal.  Mouth/Throat: Oropharynx is clear and moist.  Eyes: Conjunctivae and EOM are normal. Pupils are equal, round, and reactive to light. No scleral icterus.  Neck: Normal range of motion. Neck supple. No JVD present. No thyromegaly present.  Cardiovascular: Regular rhythm, normal heart sounds and intact distal pulses.  Exam reveals no gallop and no friction rub.   No murmur heard. Posterior tibia pulses full Dorsalis pedis pulses  faint  Pulmonary/Chest: Effort normal and breath sounds normal. He exhibits no tenderness.  Abdominal: Soft. Bowel sounds are normal. He exhibits no distension and no mass. There is no tenderness.  Healing mid abdominal scar  Genitourinary: Penis normal.  Musculoskeletal: Normal range of motion. He exhibits no edema and no tenderness.  Lymphadenopathy:    He has no cervical adenopathy.  Neurological: He is alert. He has normal reflexes. No cranial nerve deficit. Coordination normal.  Skin: Skin is warm  and dry. No rash noted.  Psychiatric: He has a normal mood and affect. His behavior is normal.          Assessment & Plan:   Preventive health examination Hypertension stable Status post surgery for ruptured appendicitis BHD syndrome.  Recent inpatient CT abdominal scan.  Consider ultrasound in 24-36 months

## 2013-05-12 NOTE — Patient Instructions (Signed)
Limit your sodium (Salt) intake    It is important that you exercise regularly, at least 20 minutes 3 to 4 times per week.  If you develop chest pain or shortness of breath seek  medical attention.  Please check your blood pressure on a regular basis.  If it is consistently greater than 150/90, please make an office appointment.  Return in one year for follow-up  

## 2013-05-13 ENCOUNTER — Telehealth: Payer: Self-pay | Admitting: Internal Medicine

## 2013-05-13 NOTE — Telephone Encounter (Signed)
Relevant patient education assigned to patient using Emmi. ° °

## 2013-06-10 ENCOUNTER — Other Ambulatory Visit: Payer: Self-pay | Admitting: Internal Medicine

## 2013-07-10 ENCOUNTER — Encounter (INDEPENDENT_AMBULATORY_CARE_PROVIDER_SITE_OTHER): Payer: Commercial Managed Care - PPO | Admitting: General Surgery

## 2013-09-02 ENCOUNTER — Other Ambulatory Visit: Payer: Self-pay | Admitting: Dermatology

## 2013-10-07 ENCOUNTER — Ambulatory Visit: Payer: 59 | Admitting: Internal Medicine

## 2013-10-14 ENCOUNTER — Other Ambulatory Visit (HOSPITAL_COMMUNITY): Payer: Self-pay | Admitting: Orthopedic Surgery

## 2013-10-14 DIAGNOSIS — M25562 Pain in left knee: Secondary | ICD-10-CM

## 2013-10-15 ENCOUNTER — Other Ambulatory Visit: Payer: Self-pay | Admitting: Dermatology

## 2013-10-21 ENCOUNTER — Ambulatory Visit (HOSPITAL_COMMUNITY)
Admission: RE | Admit: 2013-10-21 | Discharge: 2013-10-21 | Disposition: A | Discharge status: Home / Self Care | Payer: 59 | Source: Ambulatory Visit | Attending: Orthopedic Surgery | Admitting: Orthopedic Surgery

## 2013-10-21 ENCOUNTER — Other Ambulatory Visit (HOSPITAL_COMMUNITY): Payer: Self-pay | Admitting: Orthopedic Surgery

## 2013-10-21 DIAGNOSIS — X58XXXA Exposure to other specified factors, initial encounter: Secondary | ICD-10-CM | POA: Insufficient documentation

## 2013-10-21 DIAGNOSIS — M2240 Chondromalacia patellae, unspecified knee: Secondary | ICD-10-CM | POA: Diagnosis not present

## 2013-10-21 DIAGNOSIS — IMO0002 Reserved for concepts with insufficient information to code with codable children: Secondary | ICD-10-CM | POA: Diagnosis not present

## 2013-10-21 DIAGNOSIS — M25561 Pain in right knee: Secondary | ICD-10-CM

## 2013-10-21 DIAGNOSIS — S83429A Sprain of lateral collateral ligament of unspecified knee, initial encounter: Secondary | ICD-10-CM | POA: Diagnosis not present

## 2013-10-21 DIAGNOSIS — R609 Edema, unspecified: Secondary | ICD-10-CM | POA: Insufficient documentation

## 2013-10-21 DIAGNOSIS — M79609 Pain in unspecified limb: Secondary | ICD-10-CM | POA: Diagnosis present

## 2013-10-21 DIAGNOSIS — S83289A Other tear of lateral meniscus, current injury, unspecified knee, initial encounter: Secondary | ICD-10-CM | POA: Diagnosis not present

## 2013-10-21 DIAGNOSIS — M7989 Other specified soft tissue disorders: Secondary | ICD-10-CM | POA: Diagnosis present

## 2013-10-21 DIAGNOSIS — M25562 Pain in left knee: Secondary | ICD-10-CM

## 2013-12-05 ENCOUNTER — Other Ambulatory Visit: Payer: Self-pay

## 2013-12-30 ENCOUNTER — Other Ambulatory Visit: Payer: Self-pay | Admitting: Internal Medicine

## 2014-03-23 IMAGING — CR DG HAND COMPLETE 3+V*L*
3 series · 3 of 3 positions shown · non-contrast
Comparison: None.

CLINICAL DATA: Finger laceration

LEFT HAND - COMPLETE 3+ VIEW

[x hand pa left]
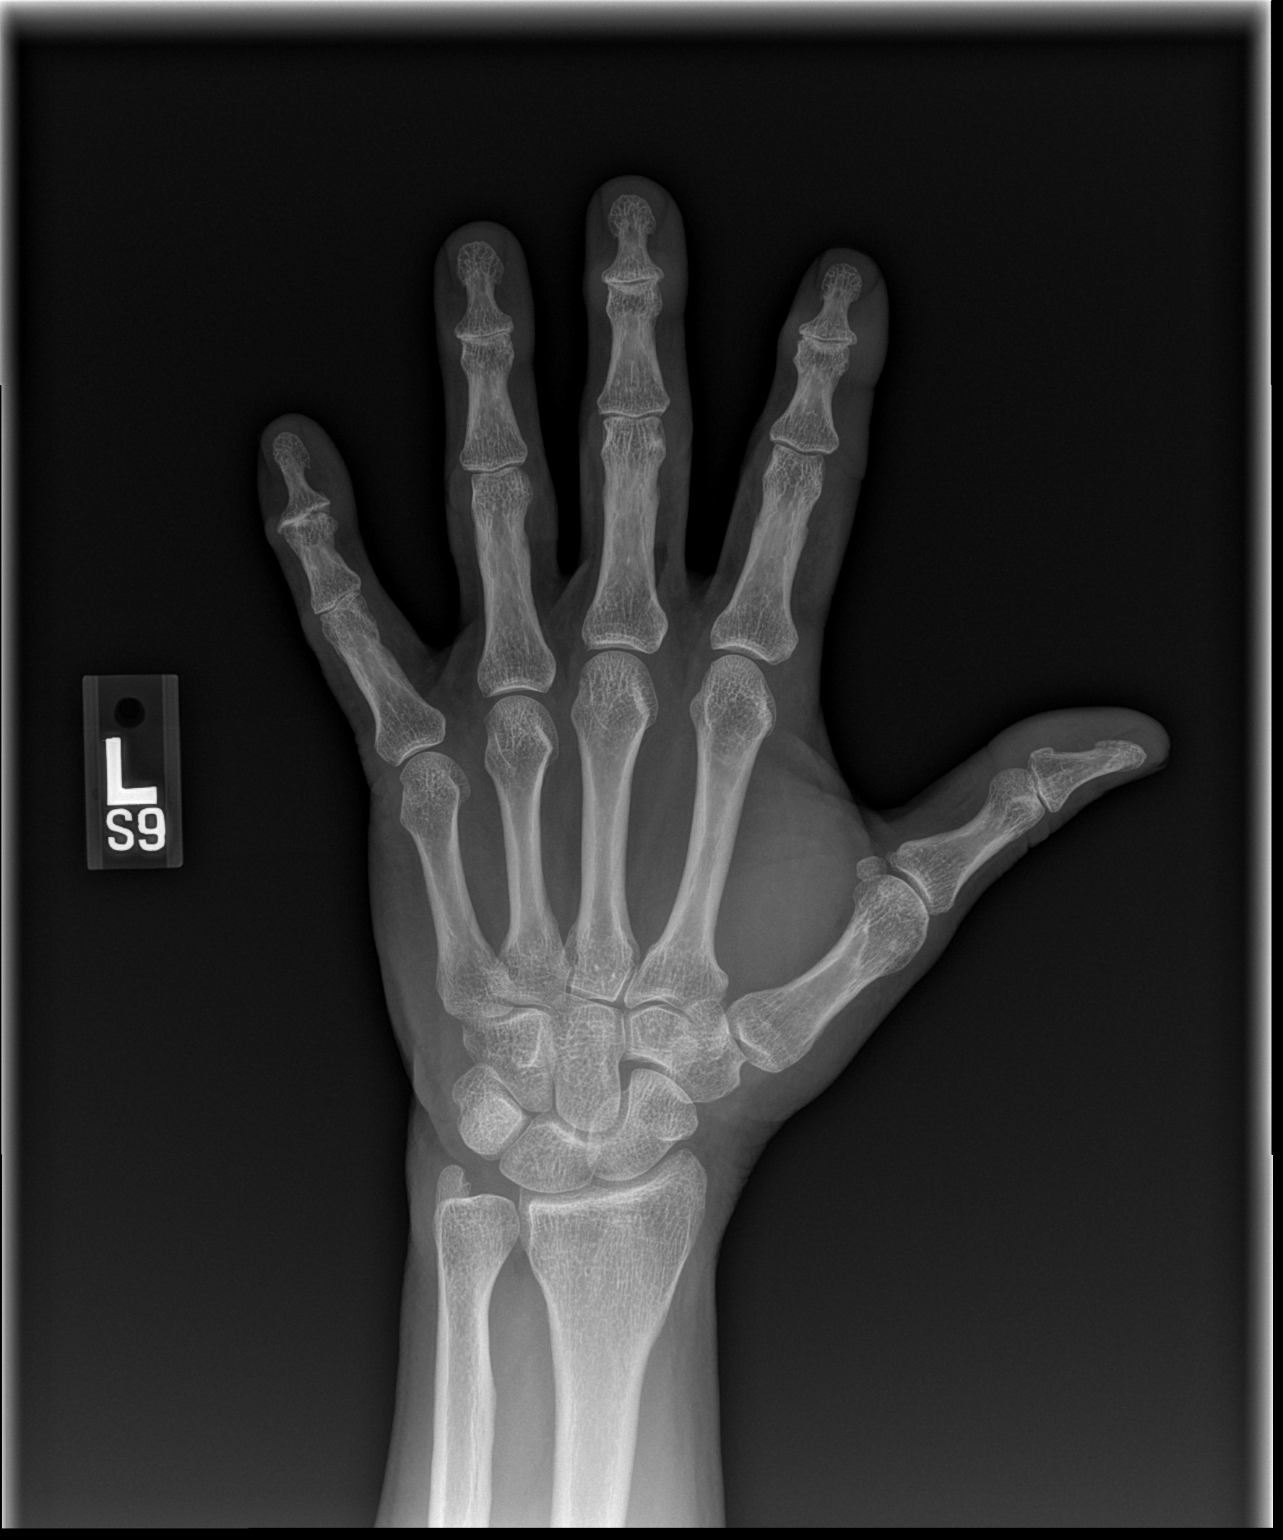

[x hand obl left]
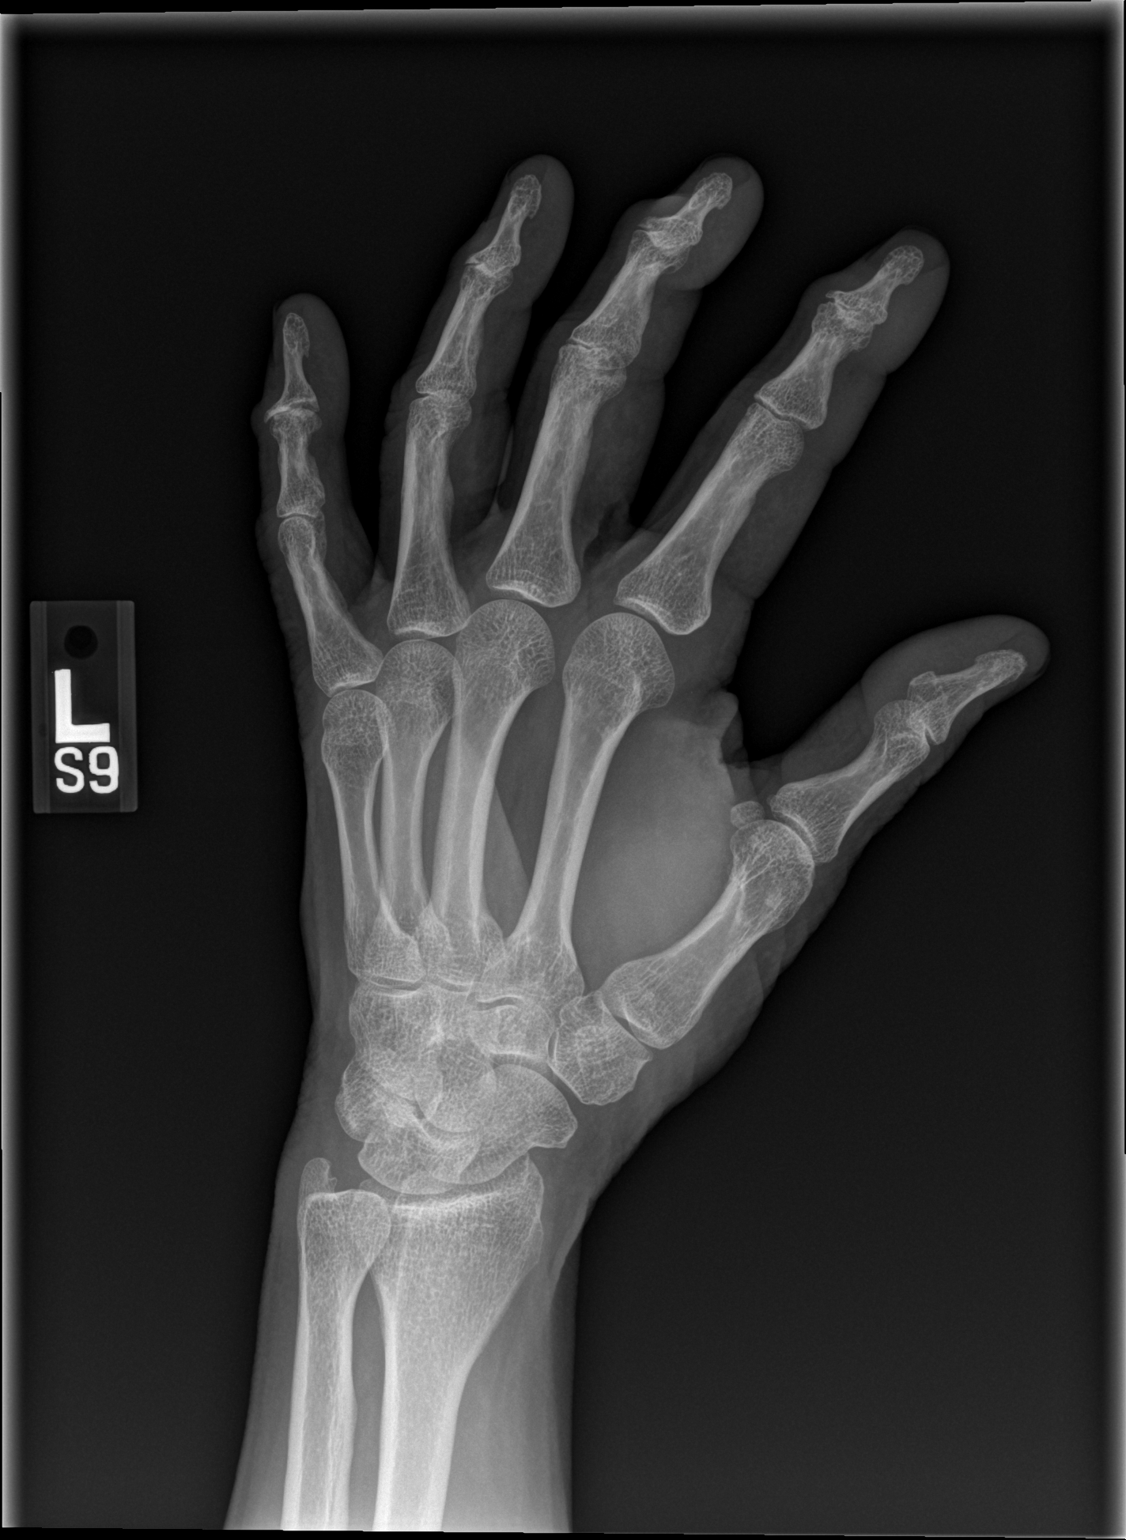

[x hand lat left]
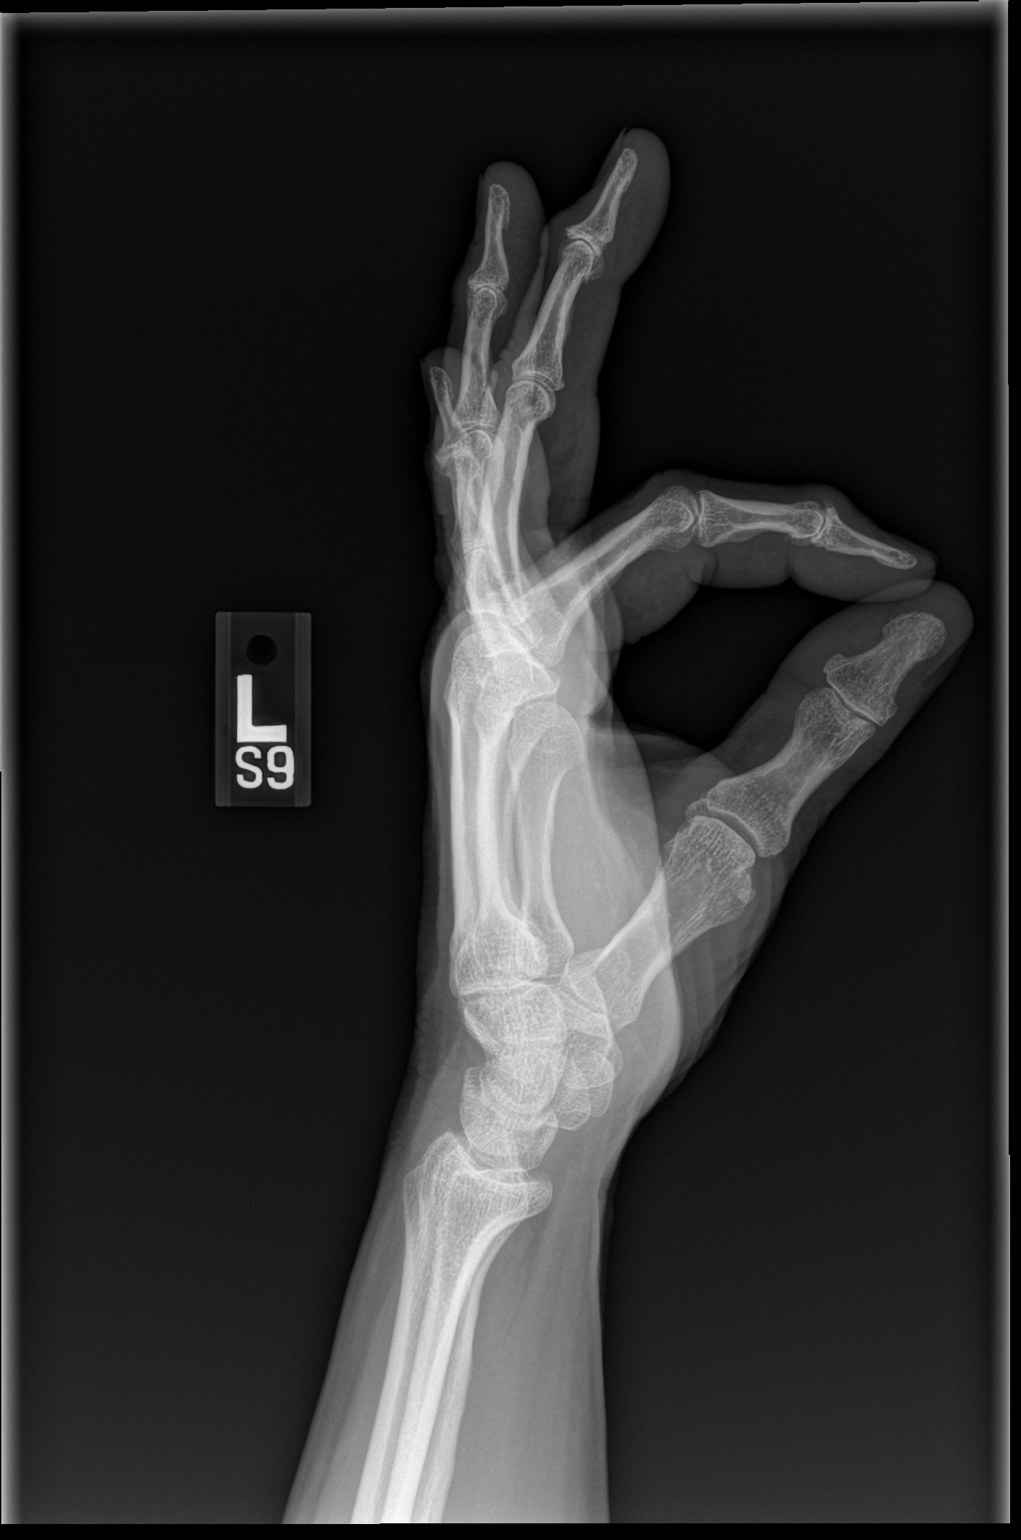

[3 of 3 positions shown; findings below may reference images not displayed]

FINDINGS: Three views of the left hand submitted.  No acute
fracture or subluxation.  No radiopaque foreign body.  Degenerative
changes noted distal interphalangeal joints second third fourth and
fifth finger.
IMPRESSION: No acute fracture or subluxation.  Degenerative changes distal
interphalangeal joints.

## 2014-03-25 ENCOUNTER — Other Ambulatory Visit (INDEPENDENT_AMBULATORY_CARE_PROVIDER_SITE_OTHER): Payer: Self-pay | Admitting: General Surgery

## 2014-03-25 DIAGNOSIS — K432 Incisional hernia without obstruction or gangrene: Secondary | ICD-10-CM

## 2014-03-27 ENCOUNTER — Other Ambulatory Visit (INDEPENDENT_AMBULATORY_CARE_PROVIDER_SITE_OTHER): Payer: Self-pay

## 2014-03-27 DIAGNOSIS — K432 Incisional hernia without obstruction or gangrene: Secondary | ICD-10-CM

## 2014-04-01 ENCOUNTER — Ambulatory Visit (HOSPITAL_COMMUNITY)
Admission: RE | Admit: 2014-04-01 | Discharge: 2014-04-01 | Disposition: A | Discharge status: Home / Self Care | Payer: 59 | Source: Ambulatory Visit | Attending: General Surgery | Admitting: General Surgery

## 2014-04-01 DIAGNOSIS — K432 Incisional hernia without obstruction or gangrene: Secondary | ICD-10-CM | POA: Diagnosis not present

## 2014-04-01 MED ORDER — IOHEXOL 300 MG/ML  SOLN
100.0000 mL | Freq: Once | INTRAMUSCULAR | Status: AC | PRN
  Administered 2014-04-01: 100 mL via INTRAVENOUS

## 2014-05-07 ENCOUNTER — Other Ambulatory Visit: Payer: 59

## 2014-05-07 NOTE — Progress Notes (Signed)
Please put orders in Epic surgery 05-12-14 pre op 05-11-14 Thanks

## 2014-05-11 ENCOUNTER — Encounter (HOSPITAL_COMMUNITY): Payer: Self-pay

## 2014-05-11 ENCOUNTER — Ambulatory Visit: Payer: Self-pay | Admitting: General Surgery

## 2014-05-11 ENCOUNTER — Encounter (HOSPITAL_COMMUNITY)
Admission: RE | Admit: 2014-05-11 | Discharge: 2014-05-11 | Disposition: A | Discharge status: Home / Self Care | Payer: 59 | Source: Ambulatory Visit | Attending: General Surgery | Admitting: General Surgery

## 2014-05-11 DIAGNOSIS — Z01812 Encounter for preprocedural laboratory examination: Secondary | ICD-10-CM | POA: Insufficient documentation

## 2014-05-11 DIAGNOSIS — Z01810 Encounter for preprocedural cardiovascular examination: Secondary | ICD-10-CM | POA: Insufficient documentation

## 2014-05-11 HISTORY — DX: Unspecified hearing loss, left ear: H91.92

## 2014-05-11 LAB — CBC WITH DIFFERENTIAL/PLATELET
Basophils Absolute: 0 10*3/uL (ref 0.0–0.1)
Basophils Relative: 0 % (ref 0–1)
Eosinophils Absolute: 0.3 10*3/uL (ref 0.0–0.7)
Eosinophils Relative: 5 % (ref 0–5)
HCT: 46.5 % (ref 39.0–52.0)
HEMOGLOBIN: 16.4 g/dL (ref 13.0–17.0)
LYMPHS ABS: 2.2 10*3/uL (ref 0.7–4.0)
Lymphocytes Relative: 35 % (ref 12–46)
MCH: 30.7 pg (ref 26.0–34.0)
MCHC: 35.3 g/dL (ref 30.0–36.0)
MCV: 87.1 fL (ref 78.0–100.0)
MONOS PCT: 7 % (ref 3–12)
Monocytes Absolute: 0.5 10*3/uL (ref 0.1–1.0)
NEUTROS ABS: 3.4 10*3/uL (ref 1.7–7.7)
Neutrophils Relative %: 53 % (ref 43–77)
Platelets: 198 10*3/uL (ref 150–400)
RBC: 5.34 MIL/uL (ref 4.22–5.81)
RDW: 12.9 % (ref 11.5–15.5)
WBC: 6.5 10*3/uL (ref 4.0–10.5)

## 2014-05-11 LAB — COMPREHENSIVE METABOLIC PANEL
ALK PHOS: 86 U/L (ref 39–117)
ALT: 33 U/L (ref 0–53)
AST: 23 U/L (ref 0–37)
Albumin: 4.7 g/dL (ref 3.5–5.2)
Anion gap: 8 (ref 5–15)
BUN: 16 mg/dL (ref 6–23)
CALCIUM: 9.4 mg/dL (ref 8.4–10.5)
CO2: 27 mmol/L (ref 19–32)
Chloride: 105 mmol/L (ref 96–112)
Creatinine, Ser: 1.04 mg/dL (ref 0.50–1.35)
GFR calc Af Amer: 89 mL/min — ABNORMAL LOW (ref >90)
GFR, EST NON AFRICAN AMERICAN: 77 mL/min — AB (ref >90)
Glucose, Bld: 99 mg/dL (ref 70–99)
Potassium: 4.4 mmol/L (ref 3.5–5.1)
SODIUM: 140 mmol/L (ref 135–145)
Total Bilirubin: 0.8 mg/dL (ref 0.3–1.2)
Total Protein: 8 g/dL (ref 6.0–8.3)

## 2014-05-11 NOTE — H&P (Signed)
Ernest Hudson 04/29/2014 11:15 AM Location: [facility] Patient #: [id number] DOB: [birth year] Married / Language: English / Race: White Male  History of Present Illness Ernest Hudson M. [name] MD; 04/30/2014 9:51 AM) Patient words: hernia recheck, discuss surgery.  The patient is a 59 year old male who presents with an incisional hernia. He underwent emergent laparoscopic appendectomy which was converted to open ileocecectomy 04/07/2013 for gangrenous appendicitis. I last saw him in early February and diagnosed a ventral incisional hernia but wanted to get a CT to evaluate his abdominal wall anatomy. His wound was left open to heal by secondary intention. He comes in complaining of bulge in his upper abdomen. He noticed it about 2 months ago. It will occasionally bother him if he presses hard on it. He denies that it has ever been firm to touch. He denies nausea/vomiting/diarrhea. He does take stool softner occassionally. 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 He no longer smokes. He quit about 2 years ago.   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 Appendectomy Vasectomy  Diagnostic Studies History Gayland Curry, MD; 04/30/2014 10:02 AM) Colonoscopy 5-10 years ago  Allergies Elbert Ewings, CMA; 04/29/2014 11:29 AM) No Known Drug Allergies03/10/2014 (Marked as Inactive) Niacin (Antihyperlipidemic) *ANTIHYPERLIPIDEMICS*  Medication History Elbert Ewings, CMA; 04/29/2014 11:30 AM) Lisinopril (40MG  Tablet, Oral) Active. AmLODIPine Besylate (10MG  Tablet, Oral) Active. Aspirin EC (81MG  Tablet DR, Oral) Active. Flonase Allergy Relief (50MCG/ACT Suspension, Nasal as needed) Active. Medications Reconciled Multiple  Vitamin (Oral) Active. Cod Liver Oil (Oral) Active.  Social History Gayland Curry, MD; 04/30/2014 10:02 AM) Caffeine use Carbonated beverages, Coffee, Tea. Tobacco use Former smoker. No alcohol use No drug use  Family History Gayland Curry, MD; 04/30/2014 10:02 AM) Prostate Cancer Father. Heart Disease Father. Hypertension Father. Diabetes Mellitus Father, Mother. Breast Cancer Mother.  Vitals Elbert Ewings CMA; 04/29/2014 11:30 AM) 04/29/2014 11:30 AM Height: 70in Temp.: 23F(Temporal)  Pulse: 60 (Regular)  Resp.: 15 (Unlabored)  BP: 132/68 (Sitting, Left Arm, Standard)    Physical Exam Ernest Hudson M. [name] MD; 04/30/2014 9:57 AM) General Mental Status-Alert. General Appearance-Consistent with stated age. Hydration-Well hydrated. Voice-Normal.  Head and Neck Head-normocephalic, atraumatic with no lesions or palpable masses. Trachea-midline. Thyroid Gland Characteristics - normal size and consistency.  Eye Eyeball - Bilateral-Extraocular movements intact. Sclera/Conjunctiva - Bilateral-No scleral icterus.  Chest and Lung Exam Chest and lung exam reveals -quiet, even and easy respiratory effort with no use of accessory muscles and on auscultation, normal breath sounds, no adventitious sounds and normal vocal resonance. Inspection Chest Wall - Normal. Back - normal.  Breast - Did not examine.  Cardiovascular Cardiovascular examination reveals -normal heart sounds, regular rate and rhythm with no murmurs and normal pedal pulses bilaterally.  Abdomen Inspection  Inspection of the abdomen reveals: Note: midline incision - has palpable defect near apex of incision on left side, soft, nt, reducible. defect about 2-3cm; ?defect at umbilicus as well. Skin - Scar - no surgical scars. Palpation/Percussion Palpation and Percussion of the abdomen reveal - Soft, Non Tender, No Rebound tenderness, No Rigidity (guarding) and No  hepatosplenomegaly. Auscultation Auscultation of the abdomen reveals - Bowel sounds normal.  Peripheral Vascular Upper Extremity Palpation - Pulses bilaterally normal.  Neurologic Neurologic evaluation reveals -alert and oriented x 3 with  no impairment of recent or remote memory. Mental Status-Normal.  Neuropsychiatric The patient's mood and affect are described as -normal. Judgment and Insight-insight is appropriate concerning matters relevant to self.  Musculoskeletal Normal Exam - Left-Upper Extremity Strength Normal and Lower Extremity Strength Normal. Normal Exam - Right-Upper Extremity Strength Normal and Lower Extremity Strength Normal.  Lymphatic Head & Neck  General Head & Neck Lymphatics: Bilateral - Description - Normal. Axillary - Did not examine. Femoral & Inguinal - Did not examine.    Assessment & Plan Ernest Hudson M. 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 There is about 1.5cm of intact fascia between the two. We had a very prolonged conversation about management of incisional hernias. He was given Neurosurgeon. I also drew diagrams. We discussed open versus laparoscopic repair. We discussed the pros and cons of each. We discussed with each approach would entail. I discussed how the mesh would be fixated to the abdominal wall muscle. I did explain that mesh would need to be used given the size of the larger hernia. 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 Current Plans  We discussed the etiology of ventral hernias. We discussed the signs and symptoms of incarceration and strangulation. The patient was given educational material. I also drew diagrams.  We discussed  nonoperative and operative management. With respect to operative management, we discussed both open repair and laparoscopic repair. We discussed the pros and cons of each approach. I discussed the typical aftercare with each procedure and how each procedure differs.  The patient has elected to [facility]  We discussed the risk and benefits of surgery including but not limited to bleeding, infection, injury to surrounding structures, hernia recurrence, mesh complications, hematoma/seroma formation, blood clot formation, urinary retention, post operative ileus, general anesthesia risk, long-term abdominal pain. We discussed that this procedure can be quite uncomfortable and difficult to recover from based on how the mesh is secured to the abdominal wall. We discussed the importance of avoiding heavy lifting and straining for a period of 6 weeks. Schedule for Surgery Provided with written educational materials  No post appt   [name], MD, FACS General, Bariatric, & Minimally Invasive Surgery [facility], [place]

## 2014-05-11 NOTE — Patient Instructions (Addendum)
67 SHELDON SEM  05/11/2014   Your procedure is scheduled on:  3-22 -2016 Tuesday  Enter through [facility]  Entrance and follow signs to [facility]. Arrive at    1200 noon.  Call this number if you have problems the morning of surgery: [phone]  Or Presurgical Testing [phone].   For Living Will and/or Health Care Power Attorney Forms: please provide copy for your medical record,may bring AM of surgery(Forms should be already notarized -we do not provide this service).(05-11-14  No information preferred today).     Do not eat food/ or drink: After Midnight.  Exception: may have clear liquids:up to 6 Hours before arrival. Nothing after: 0800 AM  Clear liquids include soda, tea, black coffee, apple or grape juice, broth.  Take these medicines the morning of surgery with A SIP OF WATER: Amlodipine. Cetirizine. Use Flonase/ if need.   Do not wear jewelry, make-up or nail polish.  Do not wear deodorant, lotions, powders, or perfumes.   Do not shave legs and under arms- 48 hours(2 days) prior to first CHG shower.(Shaving face and neck okay.)  Do not bring valuables to the hospital.(Hospital is not responsible for lost valuables).  Contacts, dentures or removable bridgework, body piercing, hair pins may not be worn into surgery.  Leave suitcase in the car. After surgery it may be brought to your room.  For patients admitted to the hospital, checkout time is 11:00 AM the day of discharge.(Restricted visitors-Any Persons displaying flu-like symptoms or illness).    Patients discharged the day of surgery will not be allowed to drive home. Must have responsible person with you x 24 hours once discharged.  Name and phone number of your driver: Ernest Hudson, spouse [phone] cell     Please read over the following fact sheets that you were given:  CHG(Chlorhexidine Gluconate 4% Surgical Soap) use.  Remember : Type/Screen "Blue armbands" - may not be removed once  applied(would result in being retested AM of surgery, if removed).         Ernest Hudson - Preparing for Surgery Before surgery, you can play an important role.  Because skin is not sterile, your skin needs to be as free of germs as possible.  You can reduce the number of germs on your skin by washing with CHG (chlorahexidine gluconate) soap before surgery.  CHG is an antiseptic cleaner which kills germs and bonds with the skin to continue killing germs even after washing. Please DO NOT use if you have an allergy to CHG or antibacterial soaps.  If your skin becomes reddened/irritated stop using the CHG and inform your nurse when you arrive at Short Stay. Do not shave (including legs and underarms) for at least 48 hours prior to the first CHG shower.  You may shave your face/neck. Please follow these instructions carefully:  1.  Shower with CHG Soap the night before surgery and the  morning of Surgery.  2.  If you choose to wash your hair, wash your hair first as usual with your  normal  shampoo.  3.  After you shampoo, rinse your hair and body thoroughly to remove the  shampoo.                           4.  Use CHG as you would any other liquid soap.  You can apply chg directly  to the skin and wash  Gently with a scrungie or clean washcloth.  5.  Apply the CHG Soap to your body ONLY FROM THE NECK DOWN.   Do not use on face/ open                           Wound or open sores. Avoid contact with eyes, ears mouth and genitals (private parts).                       Wash face,  Genitals (private parts) with your normal soap.             6.  Wash thoroughly, paying special attention to the area where your surgery  will be performed.  7.  Thoroughly rinse your body with warm water from the neck down.  8.  DO NOT shower/wash with your normal soap after using and rinsing off  the CHG Soap.                9.  Pat yourself dry with a clean towel.            10.  Wear clean pajamas.             11.  Place clean sheets on your bed the night of your first shower and do not  sleep with pets. Day of Surgery : Do not apply any lotions/deodorants the morning of surgery.  Please wear clean clothes to the hospital/surgery center.  FAILURE TO FOLLOW THESE INSTRUCTIONS MAY RESULT IN THE CANCELLATION OF YOUR SURGERY PATIENT SIGNATURE_________________________________  NURSE SIGNATURE__________________________________  ________________________________________________________________________

## 2014-05-11 NOTE — Pre-Procedure Instructions (Signed)
05-11-14 EKG done today.

## 2014-05-11 NOTE — Anesthesia Preprocedure Evaluation (Signed)
Anesthesia Evaluation  Patient identified by MRN, date of birth, ID band Patient awake    Reviewed: Allergy & Precautions, NPO status , Patient's Chart, lab work & pertinent test results  History of Anesthesia Complications Negative for: history of anesthetic complications  Airway Mallampati: II  TM Distance: >3 FB Neck ROM: Full    Dental no notable dental hx.    Pulmonary former smoker,  breath sounds clear to auscultation  Pulmonary exam normal       Cardiovascular hypertension, Pt. on medications + dysrhythmias Rhythm:Regular Rate:Normal     Neuro/Psych negative neurological ROS  negative psych ROS   GI/Hepatic negative GI ROS, Neg liver ROS,   Endo/Other  obesity  Renal/GU Renal disease  negative genitourinary   Musculoskeletal  (+) Arthritis -,   Abdominal   Peds negative pediatric ROS (+)  Hematology negative hematology ROS (+)   Anesthesia Other Findings   Reproductive/Obstetrics negative OB ROS                             Anesthesia Physical Anesthesia Plan  ASA: II  Anesthesia Plan: General   Post-op Pain Management:    Induction: Intravenous  Airway Management Planned: Oral ETT  Additional Equipment:   Intra-op Plan:   Post-operative Plan: Extubation in OR  Informed Consent: I have reviewed the patients History and Physical, chart, labs and discussed the procedure including the risks, benefits and alternatives for the proposed anesthesia with the patient or authorized representative who has indicated his/her understanding and acceptance.   Dental advisory given  Plan Discussed with: CRNA  Anesthesia Plan Comments:         Anesthesia Quick Evaluation

## 2014-05-12 ENCOUNTER — Ambulatory Visit (HOSPITAL_COMMUNITY): Payer: 59 | Admitting: Anesthesiology

## 2014-05-12 ENCOUNTER — Encounter (HOSPITAL_COMMUNITY): Payer: Self-pay | Admitting: *Deleted

## 2014-05-12 ENCOUNTER — Inpatient Hospital Stay (HOSPITAL_COMMUNITY)
Admission: RE | Admit: 2014-05-12 | Discharge: 2014-05-23 | DRG: 354 | Disposition: A | Discharge status: Home / Self Care | Payer: 59 | Source: Ambulatory Visit | Attending: General Surgery | Admitting: General Surgery

## 2014-05-12 ENCOUNTER — Encounter (HOSPITAL_COMMUNITY)
Admission: RE | Disposition: A | Discharge status: Home / Self Care | Payer: Self-pay | Source: Ambulatory Visit | Attending: General Surgery

## 2014-05-12 DIAGNOSIS — Z8042 Family history of malignant neoplasm of prostate: Secondary | ICD-10-CM

## 2014-05-12 DIAGNOSIS — R140 Abdominal distension (gaseous): Secondary | ICD-10-CM

## 2014-05-12 DIAGNOSIS — N179 Acute kidney failure, unspecified: Secondary | ICD-10-CM | POA: Diagnosis not present

## 2014-05-12 DIAGNOSIS — Y838 Other surgical procedures as the cause of abnormal reaction of the patient, or of later complication, without mention of misadventure at the time of the procedure: Secondary | ICD-10-CM | POA: Diagnosis not present

## 2014-05-12 DIAGNOSIS — K432 Incisional hernia without obstruction or gangrene: Secondary | ICD-10-CM | POA: Diagnosis present

## 2014-05-12 DIAGNOSIS — Z87891 Personal history of nicotine dependence: Secondary | ICD-10-CM | POA: Diagnosis not present

## 2014-05-12 DIAGNOSIS — Z720 Tobacco use: Secondary | ICD-10-CM | POA: Diagnosis present

## 2014-05-12 DIAGNOSIS — I10 Essential (primary) hypertension: Secondary | ICD-10-CM | POA: Diagnosis present

## 2014-05-12 DIAGNOSIS — D62 Acute posthemorrhagic anemia: Secondary | ICD-10-CM | POA: Diagnosis not present

## 2014-05-12 DIAGNOSIS — K660 Peritoneal adhesions (postprocedural) (postinfection): Secondary | ICD-10-CM | POA: Diagnosis present

## 2014-05-12 DIAGNOSIS — Z01812 Encounter for preprocedural laboratory examination: Secondary | ICD-10-CM

## 2014-05-12 DIAGNOSIS — K219 Gastro-esophageal reflux disease without esophagitis: Secondary | ICD-10-CM | POA: Diagnosis present

## 2014-05-12 DIAGNOSIS — Z8249 Family history of ischemic heart disease and other diseases of the circulatory system: Secondary | ICD-10-CM | POA: Diagnosis not present

## 2014-05-12 DIAGNOSIS — R509 Fever, unspecified: Secondary | ICD-10-CM

## 2014-05-12 DIAGNOSIS — K913 Postprocedural intestinal obstruction: Secondary | ICD-10-CM | POA: Diagnosis not present

## 2014-05-12 DIAGNOSIS — M96830 Postprocedural hemorrhage and hematoma of a musculoskeletal structure following a musculoskeletal system procedure: Secondary | ICD-10-CM | POA: Diagnosis not present

## 2014-05-12 HISTORY — PX: INCISIONAL HERNIA REPAIR: SHX193

## 2014-05-12 HISTORY — PX: INSERTION OF MESH: SHX5868

## 2014-05-12 SURGERY — REPAIR, HERNIA, INCISIONAL
Anesthesia: General | Site: Abdomen

## 2014-05-12 MED ORDER — CISATRACURIUM BESYLATE 20 MG/10ML IV SOLN
INTRAVENOUS | Status: AC
Start: 2014-05-12 — End: 2014-05-12
  Filled 2014-05-12: qty 10

## 2014-05-12 MED ORDER — CEFAZOLIN SODIUM-DEXTROSE 2-3 GM-% IV SOLR
2.0000 g | INTRAVENOUS | Status: AC
  Administered 2014-05-12: 2 g via INTRAVENOUS

## 2014-05-12 MED ORDER — ONDANSETRON HCL 4 MG/2ML IJ SOLN
4.0000 mg | Freq: Four times a day (QID) | INTRAMUSCULAR | Status: DC | PRN
  Administered 2014-05-13: 4 mg via INTRAVENOUS
  Filled 2014-05-12: qty 2

## 2014-05-12 MED ORDER — CEFAZOLIN SODIUM-DEXTROSE 2-3 GM-% IV SOLR
2.0000 g | Freq: Four times a day (QID) | INTRAVENOUS | Status: AC
  Administered 2014-05-12 – 2014-05-13 (×3): 2 g via INTRAVENOUS
  Filled 2014-05-12 (×3): qty 50

## 2014-05-12 MED ORDER — DIPHENHYDRAMINE HCL 12.5 MG/5ML PO ELIX: 12.5000 mg | ORAL_SOLUTION | Freq: Four times a day (QID) | ORAL | Status: DC | PRN

## 2014-05-12 MED ORDER — INFLUENZA VAC SPLIT QUAD 0.5 ML IM SUSY
0.5000 mL | PREFILLED_SYRINGE | INTRAMUSCULAR | Status: DC | PRN
Start: 2014-05-14 — End: 2014-05-23
  Filled 2014-05-12 (×2): qty 0.5

## 2014-05-12 MED ORDER — SODIUM CHLORIDE 0.9 % IR SOLN
Status: AC
  Filled 2014-05-12: qty 1

## 2014-05-12 MED ORDER — ACETAMINOPHEN 10 MG/ML IV SOLN
1000.0000 mg | Freq: Four times a day (QID) | INTRAVENOUS | Status: AC
  Administered 2014-05-12 – 2014-05-13 (×4): 1000 mg via INTRAVENOUS
  Filled 2014-05-12 (×4): qty 100

## 2014-05-12 MED ORDER — PROPOFOL 10 MG/ML IV BOLUS
INTRAVENOUS | Status: DC | PRN
  Administered 2014-05-12: 200 mg via INTRAVENOUS

## 2014-05-12 MED ORDER — KCL IN DEXTROSE-NACL 20-5-0.45 MEQ/L-%-% IV SOLN
INTRAVENOUS | Status: DC
  Administered 2014-05-12: 125 mL/h via INTRAVENOUS
  Administered 2014-05-13: 14:00:00 via INTRAVENOUS
  Administered 2014-05-13 – 2014-05-14 (×3): 125 mL/h via INTRAVENOUS
  Administered 2014-05-15 – 2014-05-19 (×9): via INTRAVENOUS
  Administered 2014-05-20: 125 mL/h via INTRAVENOUS
  Administered 2014-05-20 – 2014-05-23 (×3): via INTRAVENOUS
  Filled 2014-05-12 (×31): qty 1000

## 2014-05-12 MED ORDER — CISATRACURIUM BESYLATE (PF) 10 MG/5ML IV SOLN
INTRAVENOUS | Status: DC | PRN
  Administered 2014-05-12: 2 mg via INTRAVENOUS
  Administered 2014-05-12: 6 mg via INTRAVENOUS
  Administered 2014-05-12 (×2): 2 mg via INTRAVENOUS
  Administered 2014-05-12: 8 mg via INTRAVENOUS
  Administered 2014-05-12: 2 mg via INTRAVENOUS

## 2014-05-12 MED ORDER — NALOXONE HCL 0.4 MG/ML IJ SOLN: 0.4000 mg | INTRAMUSCULAR | Status: DC | PRN

## 2014-05-12 MED ORDER — CEFAZOLIN SODIUM-DEXTROSE 2-3 GM-% IV SOLR
INTRAVENOUS | Status: AC
Start: 2014-05-12 — End: 2014-05-12
  Filled 2014-05-12: qty 50

## 2014-05-12 MED ORDER — SODIUM CHLORIDE 0.9 % IR SOLN
Status: DC | PRN
  Administered 2014-05-12: 500 mL

## 2014-05-12 MED ORDER — HYDROMORPHONE 0.3 MG/ML IV SOLN
INTRAVENOUS | Status: DC
  Administered 2014-05-12: 1.8 mg via INTRAVENOUS
  Administered 2014-05-12: 18:00:00 via INTRAVENOUS
  Administered 2014-05-12: 0.9 mg via INTRAVENOUS
  Administered 2014-05-13: 16:00:00 via INTRAVENOUS
  Administered 2014-05-13: 0.6 mg via INTRAVENOUS
  Administered 2014-05-13: 0.9 mg via INTRAVENOUS
  Administered 2014-05-13: 1.2 mg via INTRAVENOUS
  Administered 2014-05-13: 0.9 mg via INTRAVENOUS
  Filled 2014-05-12: qty 25

## 2014-05-12 MED ORDER — ACETAMINOPHEN 10 MG/ML IV SOLN
1000.0000 mg | Freq: Once | INTRAVENOUS | Status: AC
  Filled 2014-05-12: qty 100

## 2014-05-12 MED ORDER — ONDANSETRON HCL 4 MG/2ML IJ SOLN
INTRAMUSCULAR | Status: DC | PRN
  Administered 2014-05-12 (×2): 2 mg via INTRAVENOUS

## 2014-05-12 MED ORDER — NEOSTIGMINE METHYLSULFATE 10 MG/10ML IV SOLN
INTRAVENOUS | Status: DC | PRN
  Administered 2014-05-12: 5 mg via INTRAVENOUS

## 2014-05-12 MED ORDER — CHLORHEXIDINE GLUCONATE 4 % EX LIQD: 1.0000 "application " | Freq: Once | CUTANEOUS | Status: DC

## 2014-05-12 MED ORDER — CHLORHEXIDINE GLUCONATE 0.12 % MT SOLN
15.0000 mL | Freq: Two times a day (BID) | OROMUCOSAL | Status: DC
  Administered 2014-05-12: 15 mL via OROMUCOSAL
  Filled 2014-05-12 (×3): qty 15

## 2014-05-12 MED ORDER — 0.9 % SODIUM CHLORIDE (POUR BTL) OPTIME
TOPICAL | Status: DC | PRN
  Administered 2014-05-12: 1000 mL

## 2014-05-12 MED ORDER — SODIUM CHLORIDE 0.9 % IJ SOLN: 9.0000 mL | INTRAMUSCULAR | Status: DC | PRN

## 2014-05-12 MED ORDER — ONDANSETRON HCL 4 MG/2ML IJ SOLN: 4.0000 mg | Freq: Once | INTRAMUSCULAR | Status: DC | PRN

## 2014-05-12 MED ORDER — MIDAZOLAM HCL 2 MG/2ML IJ SOLN
INTRAMUSCULAR | Status: AC
  Filled 2014-05-12: qty 2

## 2014-05-12 MED ORDER — HYDROMORPHONE 0.3 MG/ML IV SOLN
INTRAVENOUS | Status: AC
  Filled 2014-05-12: qty 25

## 2014-05-12 MED ORDER — DEXAMETHASONE SODIUM PHOSPHATE 10 MG/ML IJ SOLN
INTRAMUSCULAR | Status: DC | PRN
  Administered 2014-05-12: 10 mg via INTRAVENOUS

## 2014-05-12 MED ORDER — FENTANYL CITRATE 0.05 MG/ML IJ SOLN
INTRAMUSCULAR | Status: AC
  Filled 2014-05-12: qty 5

## 2014-05-12 MED ORDER — GLYCOPYRROLATE 0.2 MG/ML IJ SOLN
INTRAMUSCULAR | Status: DC | PRN
  Administered 2014-05-12: .6 mg via INTRAVENOUS
  Administered 2014-05-12: 0.2 mg via INTRAVENOUS

## 2014-05-12 MED ORDER — MIDAZOLAM HCL 5 MG/5ML IJ SOLN
INTRAMUSCULAR | Status: DC | PRN
  Administered 2014-05-12: 2 mg via INTRAVENOUS

## 2014-05-12 MED ORDER — CISATRACURIUM BESYLATE 20 MG/10ML IV SOLN
INTRAVENOUS | Status: AC
  Filled 2014-05-12: qty 10

## 2014-05-12 MED ORDER — HEPARIN SODIUM (PORCINE) 5000 UNIT/ML IJ SOLN
5000.0000 [IU] | Freq: Three times a day (TID) | INTRAMUSCULAR | Status: DC
  Administered 2014-05-13 – 2014-05-18 (×15): 5000 [IU] via SUBCUTANEOUS
  Filled 2014-05-12 (×19): qty 1

## 2014-05-12 MED ORDER — EPHEDRINE SULFATE 50 MG/ML IJ SOLN
INTRAMUSCULAR | Status: DC | PRN
  Administered 2014-05-12 (×3): 5 mg via INTRAVENOUS

## 2014-05-12 MED ORDER — FENTANYL CITRATE 0.05 MG/ML IJ SOLN
INTRAMUSCULAR | Status: DC | PRN
  Administered 2014-05-12: 50 ug via INTRAVENOUS
  Administered 2014-05-12 (×3): 25 ug via INTRAVENOUS
  Administered 2014-05-12: 100 ug via INTRAVENOUS
  Administered 2014-05-12: 50 ug via INTRAVENOUS
  Administered 2014-05-12: 25 ug via INTRAVENOUS
  Administered 2014-05-12: 50 ug via INTRAVENOUS
  Administered 2014-05-12 (×2): 25 ug via INTRAVENOUS

## 2014-05-12 MED ORDER — CETYLPYRIDINIUM CHLORIDE 0.05 % MT LIQD: 7.0000 mL | Freq: Two times a day (BID) | OROMUCOSAL | Status: DC

## 2014-05-12 MED ORDER — LACTATED RINGERS IV SOLN
INTRAVENOUS | Status: DC
  Administered 2014-05-12: 1000 mL via INTRAVENOUS

## 2014-05-12 MED ORDER — ACETAMINOPHEN 10 MG/ML IV SOLN
INTRAVENOUS | Status: DC | PRN
  Administered 2014-05-12: 1000 mg via INTRAVENOUS

## 2014-05-12 MED ORDER — METOCLOPRAMIDE HCL 5 MG/ML IJ SOLN
INTRAMUSCULAR | Status: AC
  Filled 2014-05-12: qty 2

## 2014-05-12 MED ORDER — PROPOFOL 10 MG/ML IV BOLUS
INTRAVENOUS | Status: AC
  Filled 2014-05-12: qty 20

## 2014-05-12 MED ORDER — ONDANSETRON HCL 4 MG/2ML IJ SOLN
INTRAMUSCULAR | Status: AC
  Filled 2014-05-12: qty 2

## 2014-05-12 MED ORDER — LACTATED RINGERS IV SOLN
INTRAVENOUS | Status: DC | PRN
  Administered 2014-05-12 (×3): via INTRAVENOUS

## 2014-05-12 MED ORDER — HYDROMORPHONE HCL 1 MG/ML IJ SOLN
0.2500 mg | INTRAMUSCULAR | Status: DC | PRN
  Administered 2014-05-12 (×2): 0.5 mg via INTRAVENOUS

## 2014-05-12 MED ORDER — DEXAMETHASONE SODIUM PHOSPHATE 10 MG/ML IJ SOLN
INTRAMUSCULAR | Status: AC
  Filled 2014-05-12: qty 1

## 2014-05-12 MED ORDER — DIPHENHYDRAMINE HCL 50 MG/ML IJ SOLN: 12.5000 mg | Freq: Four times a day (QID) | INTRAMUSCULAR | Status: DC | PRN

## 2014-05-12 MED ORDER — HYDROMORPHONE HCL 1 MG/ML IJ SOLN
INTRAMUSCULAR | Status: AC
  Filled 2014-05-12: qty 1

## 2014-05-12 MED ORDER — LACTATED RINGERS IV SOLN: INTRAVENOUS | Status: DC

## 2014-05-12 MED ORDER — PANTOPRAZOLE SODIUM 40 MG IV SOLR
40.0000 mg | INTRAVENOUS | Status: DC
  Administered 2014-05-12 – 2014-05-22 (×11): 40 mg via INTRAVENOUS
  Filled 2014-05-12 (×12): qty 40

## 2014-05-12 MED ORDER — FENTANYL CITRATE 0.05 MG/ML IJ SOLN
INTRAMUSCULAR | Status: AC
  Filled 2014-05-12: qty 2

## 2014-05-12 MED ORDER — FENTANYL CITRATE 0.05 MG/ML IJ SOLN: 25.0000 ug | INTRAMUSCULAR | Status: DC | PRN

## 2014-05-12 SURGICAL SUPPLY — 40 items
APL SKNCLS STERI-STRIP NONHPOA (GAUZE/BANDAGES/DRESSINGS) ×1
APPLICATOR COTTON TIP 6IN STRL (MISCELLANEOUS) ×3 IMPLANT
BANDAGE ADH SHEER 1  50/CT (GAUZE/BANDAGES/DRESSINGS) ×2 IMPLANT
BENZOIN TINCTURE PRP APPL 2/3 (GAUZE/BANDAGES/DRESSINGS) ×2 IMPLANT
BINDER ABDOMINAL 12 ML 46-62 (SOFTGOODS) ×12 IMPLANT
BLADE 11 SAFETY STRL DISP (BLADE) ×2 IMPLANT
BLADE EXTENDED COATED 6.5IN (ELECTRODE) IMPLANT
BLADE HEX COATED 2.75 (ELECTRODE) ×3 IMPLANT
CHLORAPREP W/TINT 26ML (MISCELLANEOUS) ×3 IMPLANT
CLOSURE WOUND 1/2 X4 (GAUZE/BANDAGES/DRESSINGS) ×1
DEVICE TROCAR PUNCTURE CLOSURE (ENDOMECHANICALS) ×2 IMPLANT
DRAPE LAPAROSCOPIC ABDOMINAL (DRAPES) ×3 IMPLANT
DRAPE UTILITY XL STRL (DRAPES) ×3 IMPLANT
DRSG OPSITE POSTOP 4X12 (GAUZE/BANDAGES/DRESSINGS) ×2 IMPLANT
ELECT REM PT RETURN 9FT ADLT (ELECTROSURGICAL) ×3
ELECTRODE REM PT RTRN 9FT ADLT (ELECTROSURGICAL) ×1 IMPLANT
GLOVE BIOGEL M STRL SZ7.5 (GLOVE) IMPLANT
GLOVE BIOGEL PI IND STRL 7.0 (GLOVE) ×1 IMPLANT
GLOVE BIOGEL PI INDICATOR 7.0 (GLOVE) ×2
GLOVE ECLIPSE 7.5 STRL STRAW (GLOVE) ×13 IMPLANT
GLOVE INDICATOR 8.0 STRL GRN (GLOVE) ×3 IMPLANT
GOWN STRL REUS W/TWL LRG LVL3 (GOWN DISPOSABLE) ×3 IMPLANT
GOWN STRL REUS W/TWL XL LVL3 (GOWN DISPOSABLE) ×6 IMPLANT
KIT BASIN OR (CUSTOM PROCEDURE TRAY) ×3 IMPLANT
MESH ULTRAPRO 12X12 30X30CM (Mesh General) ×2 IMPLANT
NS IRRIG 1000ML POUR BTL (IV SOLUTION) ×3 IMPLANT
PACK GENERAL/GYN (CUSTOM PROCEDURE TRAY) ×3 IMPLANT
SPONGE LAP 18X18 X RAY DECT (DISPOSABLE) ×4 IMPLANT
STAPLER VISISTAT 35W (STAPLE) ×3 IMPLANT
STRIP CLOSURE SKIN 1/2X4 (GAUZE/BANDAGES/DRESSINGS) ×1 IMPLANT
SUT MNCRL AB 4-0 PS2 18 (SUTURE) ×3 IMPLANT
SUT NOVA T20/GS 25 (SUTURE) ×4 IMPLANT
SUT PDS AB 1 TP1 96 (SUTURE) ×4 IMPLANT
SUT PROLENE 0 CT 2 (SUTURE) ×3 IMPLANT
SUT VIC AB 3-0 SH 27 (SUTURE) ×3
SUT VIC AB 3-0 SH 27X BRD (SUTURE) ×1 IMPLANT
SYR CONTROL 10ML LL (SYRINGE) ×3 IMPLANT
TOWEL OR 17X26 10 PK STRL BLUE (TOWEL DISPOSABLE) ×6 IMPLANT
TRAY FOLEY CATH 14FRSI W/METER (CATHETERS) ×2 IMPLANT
WATER STERILE IRR 1500ML POUR (IV SOLUTION) ×3 IMPLANT

## 2014-05-12 NOTE — Brief Op Note (Signed)
05/12/2014  5:59 PM  PATIENT:  Ernest Hudson  59 y.o. male  PRE-OPERATIVE DIAGNOSIS:  Ventral Incisional Hernia  POST-OPERATIVE DIAGNOSIS:  Ventral Incisional Hernia  PROCEDURE:  Procedure(s): OPEN REPAIR OF INCISIONAL HERNIA REPAIR With posterior component separation (Rives-Stoppa) (N/A) WITH INSERTION OF MESH (N/A)  SURGEON:  Surgeon(s) and Role:    * Greer Pickerel, MD - Primary    * Excell Seltzer, MD - Assisting  PHYSICIAN ASSISTANT:   ASSISTANTS: see above   ANESTHESIA:   general  EBL:  Total I/O In: 2100 [I.V.:2100] Out: 325 [Urine:275; Blood:50]  BLOOD ADMINISTERED:none  DRAINS: Urinary Catheter (Foley)   Type of repair - Clarise Cruz [rectrorectus] with posterior component separation  (choices - primary suture, mesh, or component)  Name of mesh - Ethicon Ultrapro  Size of mesh - Length 22 cm, Width 12 cm  Mesh overlap - >5 cm  Placement of mesh - between muscle and fascia  (choices - beneath fascia and into peritoneal cavity, beneath fascia but external to peritoneal cavity, between the muscle and fascia, above or external to fascia)   LOCAL MEDICATIONS USED:  NONE  SPECIMEN:  No Specimen  DISPOSITION OF SPECIMEN:  N/A  COUNTS:  YES  TOURNIQUET:  * No tourniquets in log *  DICTATION: .Other Dictation: Dictation Number [id number]  PLAN OF CARE: Admit to inpatient   PATIENT DISPOSITION:  PACU - hemodynamically stable.   Delay start of Pharmacological VTE agent (>24hrs) due to surgical blood loss or risk of bleeding: no  Ernest Hudson. Ernest Pulling, MD, FACS General, Bariatric, & Minimally Invasive Surgery [facility], [place]

## 2014-05-12 NOTE — Transfer of Care (Signed)
Immediate Anesthesia Transfer of Care Note  Patient: Ernest Hudson  Procedure(s) Performed: Procedure(s): OPEN REPAIR OF INCISIONAL HERNIA REPAIR (N/A) WITH INSERTION OF MESH (N/A)  Patient Location: PACU  Anesthesia Type:General  Level of Consciousness: awake, oriented, patient cooperative, lethargic and responds to stimulation  Airway & Oxygen Therapy: Patient Spontanous Breathing and Patient connected to face mask oxygen  Post-op Assessment: Report given to RN, Post -op Vital signs reviewed and stable and Patient moving all extremities  Post vital signs: Reviewed and stable  Last Vitals:  Filed Vitals:   05/12/14 1745  BP: 138/83  Pulse: 67  Temp:   Resp: 10    Complications: No apparent anesthesia complications

## 2014-05-12 NOTE — Anesthesia Procedure Notes (Addendum)
Procedure Name: Intubation Date/Time: 05/12/2014 2:22 PM Performed by: Johnathan Hausen A Pre-anesthesia Checklist: Patient identified, Timeout performed, Emergency Drugs available, Suction available and Patient being monitored Patient Re-evaluated:Patient Re-evaluated prior to inductionOxygen Delivery Method: Circle system utilized Preoxygenation: Pre-oxygenation with 100% oxygen Intubation Type: Combination inhalational/ intravenous induction Ventilation: Mask ventilation without difficulty Laryngoscope Size: Mac and 4 Grade View: Grade II Tube type: Oral Tube size: 8.0 mm Number of attempts: 1 Airway Equipment and Method: Stylet Placement Confirmation: ETT inserted through vocal cords under direct vision,  positive ETCO2 and breath sounds checked- equal and bilateral Secured at: 20 cm Tube secured with: Tape Dental Injury: Teeth and Oropharynx as per pre-operative assessment and Injury to lip

## 2014-05-12 NOTE — Interval H&P Note (Signed)
History and Physical Interval Note:  05/12/2014 1:38 PM  Ernest Hudson  has presented today for surgery, with the diagnosis of Incisional Hernia  The various methods of treatment have been discussed with the patient and family. After consideration of risks, benefits and other options for treatment, the patient has consented to  Procedure(s): [place] (N/A) WITH INSERTION OF MESH (N/A) as a surgical intervention .  The patient's history has been reviewed, patient examined, no change in status, stable for surgery.  I have reviewed the patient's chart and labs.  Questions were answered to the patient's satisfaction.    [name], MD, [place], Bariatric, & Minimally Invasive Surgery [facility], [place]    [facility]

## 2014-05-12 NOTE — Anesthesia Postprocedure Evaluation (Signed)
  Anesthesia Post-op Note  Patient: Ernest Hudson  Procedure(s) Performed: Procedure(s) (LRB): OPEN REPAIR OF INCISIONAL HERNIA REPAIR (N/A) WITH INSERTION OF MESH (N/A)  Patient Location: PACU  Anesthesia Type: General  Level of Consciousness: awake and alert   Airway and Oxygen Therapy: Patient Spontanous Breathing  Post-op Pain: mild  Post-op Assessment: Post-op Vital signs reviewed, Patient's Cardiovascular Status Stable, Respiratory Function Stable, Patent Airway and No signs of Nausea or vomiting  Last Vitals:  Filed Vitals:   05/12/14 1815  BP: 149/78  Pulse: 71  Temp:   Resp: 18    Post-op Vital Signs: stable   Complications: No apparent anesthesia complications

## 2014-05-13 ENCOUNTER — Encounter (HOSPITAL_COMMUNITY): Payer: Self-pay | Admitting: General Surgery

## 2014-05-13 LAB — CBC
HCT: 42.2 % (ref 39.0–52.0)
Hemoglobin: 14.7 g/dL (ref 13.0–17.0)
MCH: 30.1 pg (ref 26.0–34.0)
MCHC: 34.8 g/dL (ref 30.0–36.0)
MCV: 86.3 fL (ref 78.0–100.0)
Platelets: 212 10*3/uL (ref 150–400)
RBC: 4.89 MIL/uL (ref 4.22–5.81)
RDW: 12.8 % (ref 11.5–15.5)
WBC: 10.2 10*3/uL (ref 4.0–10.5)

## 2014-05-13 LAB — BASIC METABOLIC PANEL
Anion gap: 9 (ref 5–15)
BUN: 14 mg/dL (ref 6–23)
CALCIUM: 8.5 mg/dL (ref 8.4–10.5)
CO2: 26 mmol/L (ref 19–32)
Chloride: 102 mmol/L (ref 96–112)
Creatinine, Ser: 0.97 mg/dL (ref 0.50–1.35)
GFR calc Af Amer: >90 mL/min (ref >90)
GFR calc non Af Amer: 89 mL/min — ABNORMAL LOW (ref >90)
GLUCOSE: 180 mg/dL — AB (ref 70–99)
Potassium: 4.6 mmol/L (ref 3.5–5.1)
Sodium: 137 mmol/L (ref 135–145)

## 2014-05-13 MED ORDER — AMLODIPINE BESYLATE 10 MG PO TABS
10.0000 mg | ORAL_TABLET | Freq: Every day | ORAL | Status: DC
  Administered 2014-05-13 – 2014-05-15 (×3): 10 mg via ORAL
  Filled 2014-05-13 (×7): qty 1

## 2014-05-13 MED ORDER — HYDROMORPHONE 0.3 MG/ML IV SOLN
INTRAVENOUS | Status: DC
  Administered 2014-05-13: 1.3 mg via INTRAVENOUS
  Administered 2014-05-14: 0.999 mg via INTRAVENOUS
  Administered 2014-05-14: 0.799 mg via INTRAVENOUS
  Administered 2014-05-14: 0.2 mg via INTRAVENOUS
  Administered 2014-05-14: 0.799 mg via INTRAVENOUS

## 2014-05-13 MED ORDER — NALOXONE HCL 0.4 MG/ML IJ SOLN: 0.4000 mg | INTRAMUSCULAR | Status: DC | PRN

## 2014-05-13 MED ORDER — DIPHENHYDRAMINE HCL 12.5 MG/5ML PO ELIX: 12.5000 mg | ORAL_SOLUTION | Freq: Four times a day (QID) | ORAL | Status: DC | PRN

## 2014-05-13 MED ORDER — ONDANSETRON HCL 4 MG/2ML IJ SOLN: 4.0000 mg | Freq: Four times a day (QID) | INTRAMUSCULAR | Status: DC | PRN

## 2014-05-13 MED ORDER — LISINOPRIL 40 MG PO TABS
40.0000 mg | ORAL_TABLET | Freq: Every day | ORAL | Status: DC
  Administered 2014-05-13 – 2014-05-15 (×3): 40 mg via ORAL
  Filled 2014-05-13 (×7): qty 1

## 2014-05-13 MED ORDER — DIPHENHYDRAMINE HCL 50 MG/ML IJ SOLN: 12.5000 mg | Freq: Four times a day (QID) | INTRAMUSCULAR | Status: DC | PRN

## 2014-05-13 MED ORDER — OXYCODONE HCL 5 MG PO TABS
5.0000 mg | ORAL_TABLET | ORAL | Status: DC | PRN
  Administered 2014-05-14: 5 mg via ORAL
  Administered 2014-05-15: 10 mg via ORAL
  Administered 2014-05-15: 5 mg via ORAL
  Administered 2014-05-21 – 2014-05-22 (×2): 10 mg via ORAL
  Administered 2014-05-22: 5 mg via ORAL
  Filled 2014-05-13: qty 1
  Filled 2014-05-13 (×4): qty 2
  Filled 2014-05-13: qty 1

## 2014-05-13 MED ORDER — SODIUM CHLORIDE 0.9 % IJ SOLN: 9.0000 mL | INTRAMUSCULAR | Status: DC | PRN

## 2014-05-13 NOTE — Progress Notes (Signed)
Received phone orders from Dr [name] to advance diet, reduce PCA, begin home dose of BP medications.  Patient made aware of changes

## 2014-05-13 NOTE — Care Management Note (Signed)
    Page 1 of 1   05/13/2014     11:14:28 AM CARE MANAGEMENT NOTE 05/13/2014  Patient:  Ernest Hudson, Ernest Hudson   Account Number:  [id number]  Date Initiated:  05/13/2014  Documentation initiated by:  Sunday Spillers  Subjective/Objective Assessment:   59 yo male admitted s/p ventral hernia repair. PTA lived at home with spouse.     Action/Plan:   Home when stable   Anticipated DC Date:  05/15/2014   Anticipated DC Plan:  [place]  CM consult      Choice offered to / List presented to:             Status of service:  Completed, signed off Medicare Important Message given?   (If response is "NO", the following Medicare IM given date fields will be blank) Date Medicare IM given:   Medicare IM given by:   Date Additional Medicare IM given:   Additional Medicare IM given by:    Discharge Disposition:  HOME/SELF CARE  Per UR Regulation:  Reviewed for med. necessity/level of care/duration of stay  If discussed at [place] of Stay Meetings, dates discussed:    Comments:

## 2014-05-13 NOTE — Progress Notes (Signed)
1 Day Post-Op  Subjective: Some nausea. Pain ok/controlled. Didn't sleep well due to alarm on PCA (HR).   Objective: Vital signs in last 24 hours: Temp:  [97.3 F (36.3 C)-98.1 F (36.7 C)] 98 F (36.7 C) (03/23 0522) Pulse Rate:  [54-87] 60 (03/23 0522) Resp:  [10-20] 16 (03/23 0747) BP: (129-159)/(64-95) 133/95 mmHg (03/23 0522) SpO2:  [95 %-100 %] 96 % (03/23 0747) Weight:  [96.389 kg (212 lb 8 oz)] 96.389 kg (212 lb 8 oz) (03/22 1222) Last BM Date: 05/11/14  Intake/Output from previous day: 03/22 0701 - 03/23 0700 In: 3529.2 [I.V.:3529.2] Out: 2350 [Urine:2300; Blood:50] Intake/Output this shift:    Awaker, alert, nontoxic cta b/l reg Soft, mild distension, dressing c/d/i; incision ok +SCD  Lab Results:   Recent Labs  05/11/14 0930 05/13/14 0432  WBC 6.5 10.2  HGB 16.4 14.7  HCT 46.5 42.2  PLT 198 212   BMET  Recent Labs  05/11/14 0930 05/13/14 0432  NA 140 137  K 4.4 4.6  CL 105 102  CO2 27 26  GLUCOSE 99 180*  BUN 16 14  CREATININE 1.04 0.97  CALCIUM 9.4 8.5   PT/INR No results for input(s): LABPROT, INR in the last 72 hours. ABG No results for input(s): PHART, HCO3 in the last 72 hours.  Invalid input(s): PCO2, PO2  Studies/Results: No results found.  Anti-infectives: Anti-infectives    Start     Dose/Rate Route Frequency Ordered Stop   05/12/14 2200  ceFAZolin (ANCEF) IVPB 2 g/50 mL premix     2 g 100 mL/hr over 30 Minutes Intravenous Every 6 hours 05/12/14 1845 05/13/14 1559   05/12/14 1712  polymyxin B 500,000 Units, bacitracin 50,000 Units in sodium chloride irrigation 0.9 % 500 mL irrigation  Status:  Discontinued       As needed 05/12/14 1712 05/12/14 1735   05/12/14 1215  ceFAZolin (ANCEF) IVPB 2 g/50 mL premix     2 g 100 mL/hr over 30 Minutes Intravenous On call to O.R. 05/12/14 1201 05/12/14 1410      Assessment/Plan: s/p Procedure(s): OPEN REPAIR OF INCISIONAL HERNIA REPAIR (N/A) WITH INSERTION OF MESH  (N/A)  Clears for now, if tolerates, will add PO pain meds Ambulate, IS, pulm toilet preop resting HR 52. HR ok Cont VTE prophylaxis  Leighton Ruff. Ernest Pulling, MD, FACS General, Bariatric, & Minimally Invasive [place] Ernest [place], [place]   LOS: 1 day    Ernest Hudson 05/13/2014

## 2014-05-13 NOTE — Progress Notes (Signed)
Dr Marcello Moores called regarding pt blood pressure of 165/74 with heart rate of 50.  Patient concerned that blood pressure medication has not been restarted from home.  Not restarted at this time to be addressed in am with attending physician

## 2014-05-13 NOTE — Op Note (Signed)
Ernest Hudson, Ernest Hudson               ACCOUNT NO.:  [id number]  MEDICAL RECORD NO.:  [id number]  LOCATION:  64                         FACILITY:  [facility]  PHYSICIAN:  [name], MD, FACSDATE OF BIRTH:  06/30/1955  DATE OF PROCEDURE:  05/12/2014 DATE OF DISCHARGE:                              OPERATIVE REPORT   PREOPERATIVE DIAGNOSIS:  Ventral incisional hernia.  POSTOPERATIVE DIAGNOSIS:  Ventral incisional hernia.  PROCEDURE:  Open repair of ventral incisional hernia with mesh with posterior component separation (Rives-Stoppa).  SURGEON:  [name], MD, FACS.  ASSISTANT:  Marland Kitchen T. Hoxworth, M.D.  ANESTHESIA:  General.  EBL:  50 mL.  FINDINGS:  The patient had supraumbilical incisional hernia, defect measured about 4.5 cm wide and about 4 cm vertical along with a more smaller superior defect, measuring 2 x 2 cm.  There is an intact piece of fascia between the two hernias of about 1 cm, this was the hernia measurements on CT imaging.  Intraoperatively, findings were similar. Mesh was placed in the retrorectus space (between rectus muscle & peritoneum).  There was greater than 5 cm overlap in all directions.  INDICATIONS FOR PROCEDURE:  The patient is a pleasant 59 year old gentleman who underwent emergent laparoscopic exploration last February for appendicitis, which ended off being converted to open with ileocecectomy.  He developed a little bit of facile dehiscence postoperatively.  He formed ventral incisional hernia in his upper midline along with some diastasis.  It began bothering him and then he desired surgical repair.  We discussed on several occasions his goals with hernia repair.  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  We discussed the risks and benefits of surgery including, but not limited to, bleeding, infection, injury to surrounding structures, need to convert to some biologic  mesh, potential enterotomy, fistula formation, hernia recurrence, hematoma formation, seroma formation, chronic abdominal pain, blood clot formation, postoperative ileus, perioperative cardiac and pulmonary issues.  He elected to proceed with surgery.  DESCRIPTION OF PROCEDURE:  After obtaining informed consent, the patient was taken to OR #[facility] at [facility] and placed supine on the operating room table.  General endotracheal anesthesia was established. Sequential compression devices were placed.  A Foley catheter was placed.  He received 2 g of IV Ancef prior to skin incision.  His abdomen was prepped and draped in usual standard surgical fashion with ChloraPrep and we did place Ioban.  I drew using a marking pen around his old midline scar, which had healed by secondary intention, which left a hypertropic scar.  I excised the scar using a 10-blade.  I entered the abdomen in the upper midline in a fresh area.  The fascia was divided.  I then continued to march down the midline, incising the midline with Bovie electrocautery.  I came across the larger hernia, there was nothing within it.  I identified the sac.  As I continuing to march down toward the umbilicus, there was a small adhesion, adhesive band of small bowel to the anterior abdominal wall.  This was taken down with Metzenbaum scissors.  I then completely opened the remaining  part of the fascia with Bovie electrocautery.  There were minimal adhesions inside the abdomen.  I took down few adhesive bands with Metzenbaum scissors.  At this point, I felt that I could do a Runner, broadcasting/film/video. Starting on the left side of the abdominal wall, I incised the posterior sheath and got in the plane between the posterior sheath and the rectus abdominis muscle.  I was able to develop this plane, which was pretty much avascular.  I continued the dissection out laterally until I came across the perforating vessels.  I took care to  preserve the blood supply to the rectus muscles.  I then came across superiorly toward the head and took down the preperitoneal space.  Inferiorly below the level of the umbilicus and stayed preperitoneal behind the rectus muscle.  At this point, we switched sides.  Again in similar fashion, I incised the posterior sheath with electrocautery and got in the plane between the posterior sheath and the rectus abdominis muscle, again carrying my dissection out laterally until the perforating vessels were seen.  At this point, I continued it toward the cephalad and was able to meet in the same preperitoneal space up at the apex of the incision.  I then continued my dissection down toward inferiorly and was able to get in the same plane as on the left side and stayed in the preperitoneal space.  The median umbilical ligament was taken down.  At this point, there was adequate mobilization in the posterior sheath and I was able to close the posterior sheath primarily using a #1 looped PDS from above and one from below.  There was a small rent that had been made in the right upper quadrant and this was closed with a #1 Novafil.  Therefore, the entire viscera was covered and there was no exposed bowel.  Since we mobilized the rectus muscle off the posterior sheath, this allowed [place] to have a nice space for the large piece of mesh.  Measurement wise, it measured 22 x 12 cm wide.  I obtained a piece of Ethicon UltraPro mesh, 30 cm x 30 cm and trimmed it to where it was around 22 cm vertical and about 12 cm wide.  I then, using #1 Novafil suture, placed a suture at the apex of the mesh in the midpoint and tied it down.  Then using a suture passer, I brought it out several inches above the apex of the incisions using a suture passer.  It was brought out through the same skin incision, but different fascial location.  This was tied down. Half of the mesh was on the left side of the midline closure and half  was on the right.  I then placed three transfascial sutures on the left side at the left superior corner of the mesh and the midpoint at the level of the umbilicus and in the left lower quadrant.  The Novafil was tied down thus creating a transfascial suture. Where the suture was buried underneath the skin it was nice and flat.  We then did a similar fashion on the right side.  At this point, the mesh was flat and there was no redundancy.  There was 5-cm overlap in all directions.  We then irrigated the subcutaneous tissue and retrorectus space and mesh with antibiotic irrigation.  I then closed the fascia with a #1 looped PDS one from above and one from below, and re-irrigated the subcutaneous tissue.  I brought the skin together with skin  staples.  The transfascial suture sites were closed with benzoin, Steri-Strips and brown Band-Aids.  We then placed honeycomb dressing over the midline. An abdominal binder was placed.  All needle, instrument, and sponge counts were correct x2.  There were no immediate complications.  The patient tolerated the procedure well.  The patient was taken to the recovery room in stable condition.     [name], MD, FACS     EMW/MEDQ  D:  05/12/2014  T:  05/13/2014  Job:  [id number]

## 2014-05-14 ENCOUNTER — Encounter: Payer: 59 | Admitting: Internal Medicine

## 2014-05-14 LAB — CBC
HEMATOCRIT: 42.8 % (ref 39.0–52.0)
HEMOGLOBIN: 14.6 g/dL (ref 13.0–17.0)
MCH: 30.5 pg (ref 26.0–34.0)
MCHC: 34.1 g/dL (ref 30.0–36.0)
MCV: 89.4 fL (ref 78.0–100.0)
Platelets: 210 10*3/uL (ref 150–400)
RBC: 4.79 MIL/uL (ref 4.22–5.81)
RDW: 13.5 % (ref 11.5–15.5)
WBC: 11.9 10*3/uL — AB (ref 4.0–10.5)

## 2014-05-14 LAB — BASIC METABOLIC PANEL
Anion gap: 8 (ref 5–15)
BUN: 12 mg/dL (ref 6–23)
CHLORIDE: 102 mmol/L (ref 96–112)
CO2: 29 mmol/L (ref 19–32)
Calcium: 8.8 mg/dL (ref 8.4–10.5)
Creatinine, Ser: 0.98 mg/dL (ref 0.50–1.35)
GFR calc non Af Amer: 88 mL/min — ABNORMAL LOW (ref >90)
Glucose, Bld: 147 mg/dL — ABNORMAL HIGH (ref 70–99)
POTASSIUM: 4.2 mmol/L (ref 3.5–5.1)
SODIUM: 139 mmol/L (ref 135–145)

## 2014-05-14 MED ORDER — KETOROLAC TROMETHAMINE 30 MG/ML IJ SOLN
30.0000 mg | Freq: Three times a day (TID) | INTRAMUSCULAR | Status: AC
  Administered 2014-05-14 – 2014-05-17 (×10): 30 mg via INTRAVENOUS
  Filled 2014-05-14 (×10): qty 1

## 2014-05-14 MED ORDER — ONDANSETRON HCL 4 MG/2ML IJ SOLN
4.0000 mg | INTRAMUSCULAR | Status: DC | PRN
  Administered 2014-05-15 – 2014-05-22 (×10): 4 mg via INTRAVENOUS
  Filled 2014-05-14 (×11): qty 2

## 2014-05-14 MED ORDER — HYDROMORPHONE HCL 1 MG/ML IJ SOLN
0.5000 mg | INTRAMUSCULAR | Status: DC | PRN
  Administered 2014-05-15 – 2014-05-17 (×13): 1 mg via INTRAVENOUS
  Administered 2014-05-17: 2 mg via INTRAVENOUS
  Administered 2014-05-17 (×3): 1 mg via INTRAVENOUS
  Administered 2014-05-17: 2 mg via INTRAVENOUS
  Administered 2014-05-17 – 2014-05-18 (×11): 1 mg via INTRAVENOUS
  Administered 2014-05-19: 2 mg via INTRAVENOUS
  Administered 2014-05-19 (×2): 1 mg via INTRAVENOUS
  Administered 2014-05-19 (×3): 2 mg via INTRAVENOUS
  Administered 2014-05-20: 1 mg via INTRAVENOUS
  Administered 2014-05-20 (×3): 2 mg via INTRAVENOUS
  Administered 2014-05-20: 1 mg via INTRAVENOUS
  Administered 2014-05-20: 2 mg via INTRAVENOUS
  Administered 2014-05-21 – 2014-05-22 (×8): 1 mg via INTRAVENOUS
  Filled 2014-05-14: qty 1
  Filled 2014-05-14: qty 2
  Filled 2014-05-14: qty 1
  Filled 2014-05-14: qty 2
  Filled 2014-05-14 (×4): qty 1
  Filled 2014-05-14 (×2): qty 2
  Filled 2014-05-14 (×2): qty 1
  Filled 2014-05-14: qty 2
  Filled 2014-05-14 (×3): qty 1
  Filled 2014-05-14 (×2): qty 2
  Filled 2014-05-14 (×4): qty 1
  Filled 2014-05-14: qty 2
  Filled 2014-05-14 (×9): qty 1
  Filled 2014-05-14: qty 2
  Filled 2014-05-14 (×2): qty 1
  Filled 2014-05-14: qty 2
  Filled 2014-05-14 (×5): qty 1
  Filled 2014-05-14: qty 2
  Filled 2014-05-14 (×7): qty 1
  Filled 2014-05-14 (×2): qty 2

## 2014-05-14 NOTE — Progress Notes (Signed)
2 Days Post-Op  Subjective: Has worsening pain. Started late last night. Low grade fever. Burping. No n/v. No flatus  Objective: Vital signs in last 24 hours: Temp:  [97.7 F (36.5 C)-101.4 F (38.6 C)] 98.1 F (36.7 C) (03/24 0641) Pulse Rate:  [50-82] 82 (03/24 0455) Resp:  [13-26] 26 (03/24 0455) BP: (126-165)/(65-98) 135/69 mmHg (03/24 0455) SpO2:  [94 %-100 %] 98 % (03/24 0455) Last BM Date: 05/11/14  Intake/Output from previous day: 03/23 0701 - 03/24 0700 In: 3790 [P.O.:340; I.V.:3000; IV Piggyback:450] Out: 1775 [Urine:1775] Intake/Output this shift:    Looks uncomfortable cta b/l; IS 750 Reg Soft, distended, incision c/d/i. TTP throughout - no peritonitis/rebound  Lab Results:   Recent Labs  05/13/14 0432 05/14/14 0500  WBC 10.2 11.9*  HGB 14.7 14.6  HCT 42.2 42.8  PLT 212 210   BMET  Recent Labs  05/13/14 0432 05/14/14 0500  NA 137 139  K 4.6 4.2  CL 102 102  CO2 26 29  GLUCOSE 180* 147*  BUN 14 12  CREATININE 0.97 0.98  CALCIUM 8.5 8.8   PT/INR No results for input(s): LABPROT, INR in the last 72 hours. ABG No results for input(s): PHART, HCO3 in the last 72 hours.  Invalid input(s): PCO2, PO2  Studies/Results: No results found.  Anti-infectives: Anti-infectives    Start     Dose/Rate Route Frequency Ordered Stop   05/12/14 2200  ceFAZolin (ANCEF) IVPB 2 g/50 mL premix     2 g 100 mL/hr over 30 Minutes Intravenous Every 6 hours 05/12/14 1845 05/13/14 1018   05/12/14 1712  polymyxin B 500,000 Units, bacitracin 50,000 Units in sodium chloride irrigation 0.9 % 500 mL irrigation  Status:  Discontinued       As needed 05/12/14 1712 05/12/14 1735   05/12/14 1215  ceFAZolin (ANCEF) IVPB 2 g/50 mL premix     2 g 100 mL/hr over 30 Minutes Intravenous On call to O.R. 05/12/14 1201 05/12/14 1410      Assessment/Plan: s/p Procedure(s): OPEN REPAIR OF INCISIONAL HERNIA REPAIR (N/A) WITH INSERTION OF MESH (N/A)  Not sure if pt overdid it  yesterday. States his pain changed last night when he turned a certain way getting out of bed.  Since distended and no flatus will change diet back to fulls Cont PCA Add tordaol Cont reflux med Cont VTE prophylaxis pulm toilet, IS  Ernest Hudson. Ernest Pulling, MD, FACS General, Bariatric, & Minimally Invasive Surgery [facility], [place]   LOS: 2 days    Ernest Hudson 05/14/2014

## 2014-05-15 LAB — BASIC METABOLIC PANEL
ANION GAP: 8 (ref 5–15)
BUN: 13 mg/dL (ref 6–23)
CALCIUM: 8.7 mg/dL (ref 8.4–10.5)
CO2: 28 mmol/L (ref 19–32)
Chloride: 101 mmol/L (ref 96–112)
Creatinine, Ser: 1.07 mg/dL (ref 0.50–1.35)
GFR calc Af Amer: 86 mL/min — ABNORMAL LOW (ref >90)
GFR calc non Af Amer: 74 mL/min — ABNORMAL LOW (ref >90)
Glucose, Bld: 134 mg/dL — ABNORMAL HIGH (ref 70–99)
Potassium: 4.3 mmol/L (ref 3.5–5.1)
SODIUM: 137 mmol/L (ref 135–145)

## 2014-05-15 LAB — MAGNESIUM: Magnesium: 2.1 mg/dL (ref 1.5–2.5)

## 2014-05-15 LAB — CBC
HEMATOCRIT: 40.4 % (ref 39.0–52.0)
Hemoglobin: 13.7 g/dL (ref 13.0–17.0)
MCH: 30.5 pg (ref 26.0–34.0)
MCHC: 33.9 g/dL (ref 30.0–36.0)
MCV: 90 fL (ref 78.0–100.0)
Platelets: 212 10*3/uL (ref 150–400)
RBC: 4.49 MIL/uL (ref 4.22–5.81)
RDW: 13.5 % (ref 11.5–15.5)
WBC: 11.8 10*3/uL — ABNORMAL HIGH (ref 4.0–10.5)

## 2014-05-15 MED ORDER — DOCUSATE SODIUM 100 MG PO CAPS
100.0000 mg | ORAL_CAPSULE | Freq: Two times a day (BID) | ORAL | Status: DC
  Administered 2014-05-15 – 2014-05-23 (×6): 100 mg via ORAL
  Filled 2014-05-15 (×18): qty 1

## 2014-05-15 MED ORDER — ALUM & MAG HYDROXIDE-SIMETH 200-200-20 MG/5ML PO SUSP
30.0000 mL | Freq: Four times a day (QID) | ORAL | Status: DC | PRN
  Administered 2014-05-15 – 2014-05-22 (×3): 30 mL via ORAL
  Filled 2014-05-15 (×3): qty 30

## 2014-05-15 NOTE — Discharge Instructions (Signed)
[place] [facility], [place] [id number]  OPEN ABDOMINAL SURGERY: POST OP INSTRUCTIONS  Always review your discharge instruction sheet given to you by the facility where your surgery was performed.  IF YOU HAVE DISABILITY OR FAMILY LEAVE FORMS, YOU MUST BRING THEM TO THE OFFICE FOR PROCESSING.  PLEASE DO NOT GIVE THEM TO YOUR DOCTOR.  1. A prescription for pain medication may be given to you upon discharge.  Take your pain medication as prescribed, if needed.  If narcotic pain medicine is not needed, then you may take acetaminophen (Tylenol) or ibuprofen (Advil) as needed. YOU MAY FIND TAKING 800MG  OF IBUPROFEN THREE TIMES A DAY VERY HELPFUL FOR PAIN CONTROL 2. Take your usually prescribed medications unless otherwise directed. 3. If you need a refill on your pain medication, please contact your pharmacy. They will contact our office to request authorization.  Prescriptions will not be filled after 5pm or on week-ends. 4. You should follow a light diet the first few days after arrival home, such as soup and crackers, pudding, etc.unless your doctor has advised otherwise. A high-fiber, low fat diet can be resumed as tolerated.   Be sure to include lots of fluids daily. 5. Most patients will experience some swelling and bruising in the area of the incision. Ice pack will help. Swelling and bruising can take several days to resolve..  6. It is common to experience some constipation if taking pain medication after surgery.  Increasing fluid intake and taking a stool softener will usually help or prevent this problem from occurring.  A mild laxative (Milk of Magnesia or Miralax) should be taken according to package directions if there are no bowel movements after 48 hours. 7.  You may have steri-strips (small skin tapes) in place directly over the incision.  These strips should be left on the skin for 7-10 days.  If your surgeon used skin glue on the incision, you may shower in 24 hours.  The  glue will flake off over the next 2-3 weeks.  Any sutures or staples will be removed at the office during your follow-up visit. You may find that a light gauze bandage over your incision may keep your staples from being rubbed or pulled. You may shower and replace the bandage daily. 8. ACTIVITIES:  You may resume regular (light) daily activities beginning the next day--such as daily self-care, walking, climbing stairs--gradually increasing activities as tolerated.  You may have sexual intercourse when it is comfortable.  Refrain from any heavy lifting or straining until approved by your doctor. a. You may drive when you no longer are taking prescription pain medication, you can comfortably wear a seatbelt, and you can safely maneuver your car and apply brakes b. Return to Work: ___________________________________ 60. You should see your doctor in the office for a follow-up appointment approximately two weeks after your surgery.  Make sure that you call for this appointment within a day or two after you arrive home to insure a convenient appointment time. OTHER INSTRUCTIONS:  DO NOT LIFT, PUSH, OR PULL ANYTHING GREATER THAN 10 POUNDS FOR 6 WEEKS  WHEN TO CALL YOUR DOCTOR: 1. Fever over 101.0 2. Inability to urinate 3. Nausea and/or vomiting 4. Extreme swelling or bruising 5. Continued bleeding from incision. 6. Increased pain, redness, or drainage from the incision. 7. Difficulty swallowing or breathing 8. Muscle cramping or spasms. 9. Numbness or tingling in hands or feet or around lips.  The clinic staff is available to answer  your questions during regular business hours.  Please don't hesitate to call and ask to speak to one of the nurses if you have concerns.  For further questions, please visit [email or web address]   

## 2014-05-15 NOTE — Progress Notes (Signed)
3 Days Post-Op  Subjective: Some nausea. No emesis. +flatus. Walking. Pain better but still wakes up in pain  Objective: Vital signs in last 24 hours: Temp:  [97.6 F (36.4 C)-100.4 F (38 C)] 98.3 F (36.8 C) (03/25 0513) Pulse Rate:  [70-81] 70 (03/25 0513) Resp:  [14-21] 18 (03/25 0513) BP: (105-137)/(63-76) 105/63 mmHg (03/25 0513) SpO2:  [94 %-100 %] 96 % (03/25 0513) Last BM Date: 05/11/14  Intake/Output from previous day: 03/24 0701 - 03/25 0700 In: 2390 [P.O.:480; I.V.:1910] Out: 425 [Urine:425] Intake/Output this shift:    Looks better today, nontoxic, in chair cta b/l Reg +binder; honeycomb dressing. Incision appears c/d/i; belly is full but soft, +BS No edema  Lab Results:   Recent Labs  05/14/14 0500 05/15/14 0435  WBC 11.9* 11.8*  HGB 14.6 13.7  HCT 42.8 40.4  PLT 210 212   BMET  Recent Labs  05/14/14 0500 05/15/14 0435  NA 139 137  K 4.2 4.3  CL 102 101  CO2 29 28  GLUCOSE 147* 134*  BUN 12 13  CREATININE 0.98 1.07  CALCIUM 8.8 8.7   PT/INR No results for input(s): LABPROT, INR in the last 72 hours. ABG No results for input(s): PHART, HCO3 in the last 72 hours.  Invalid input(s): PCO2, PO2  Studies/Results: No results found.  Anti-infectives: Anti-infectives    Start     Dose/Rate Route Frequency Ordered Stop   05/12/14 2200  ceFAZolin (ANCEF) IVPB 2 g/50 mL premix     2 g 100 mL/hr over 30 Minutes Intravenous Every 6 hours 05/12/14 1845 05/13/14 1018   05/12/14 1712  polymyxin B 500,000 Units, bacitracin 50,000 Units in sodium chloride irrigation 0.9 % 500 mL irrigation  Status:  Discontinued       As needed 05/12/14 1712 05/12/14 1735   05/12/14 1215  ceFAZolin (ANCEF) IVPB 2 g/50 mL premix     2 g 100 mL/hr over 30 Minutes Intravenous On call to O.R. 05/12/14 1201 05/12/14 1410      Assessment/Plan: s/p Procedure(s): OPEN REPAIR OF INCISIONAL HERNIA REPAIR (N/A) WITH INSERTION OF MESH (N/A)  Looks better but still  not ready for dc given pain  Cont scheduled toradol Encouraged pt to ask for oral pain meds about every 4-6 hrs Adv diet as tolerated Cont chemical VTE prophylaxis  On dc encouraged pt to take scheduled motrin in addition to oral pain meds  Ernest Hudson. Ernest Pulling, MD, FACS General, Bariatric, & Minimally Invasive Surgery [facility], [place]   LOS: 3 days    Ernest Hudson 05/15/2014

## 2014-05-16 NOTE — Progress Notes (Signed)
Gave pt heparin shot. Upon placing binder back on pt became spontaneously nauseous and vomited 700ccs of yellow-brown emesis.  Smelled of bile. Pt's abdomen is distended this am, but feels better. Pt is ambulating.  Will continue to monitor.  Roselind Rily

## 2014-05-16 NOTE — Progress Notes (Signed)
General Surgery Note  LOS: 4 days  POD -  4 Days Post-Op  Assessment/Plan: 1.  OPEN REPAIR OF INCISIONAL HERNIA REPAIR, WITH INSERTION OF MESH - 3/223/2016 - [place]  Vomited twice - post op ileus  Will back off to clr liquid diet 2.  HTN 3.  DVT prophylaxis - SQ Heparin   Principal Problem:   Incisional hernia s/p open repair w mesh 05/13/2014 Active Problems:   Essential hypertension   Tobacco abuse   Subjective:  Vomited twice.  Feels bloated.  He has passed some flatus, no BM.  Wife in room. Objective:   Filed Vitals:   05/16/14 0540  BP: 127/72  Pulse: 84  Temp: 99.9 F (37.7 C)  Resp: 18     Intake/Output from previous day:  03/25 0701 - 03/26 0700 In: 1389.8 [P.O.:840; I.V.:549.8] Out: 900 [Urine:200; Emesis/NG output:700]  Intake/Output this shift:  Total I/O In: -  Out: 150 [Urine:150]   Physical Exam:   General: WN WM who is alert and oriented.    HEENT: Normal. Pupils equal. .   Lungs: Clear.   Abdomen: Distended.  BS present.   Wound: Clean   Lab Results:    Recent Labs  05/14/14 0500 05/15/14 0435  WBC 11.9* 11.8*  HGB 14.6 13.7  HCT 42.8 40.4  PLT 210 212    BMET   Recent Labs  05/14/14 0500 05/15/14 0435  NA 139 137  K 4.2 4.3  CL 102 101  CO2 29 28  GLUCOSE 147* 134*  BUN 12 13  CREATININE 0.98 1.07  CALCIUM 8.8 8.7    PT/INR  No results for input(s): LABPROT, INR in the last 72 hours.  ABG  No results for input(s): PHART, HCO3 in the last 72 hours.  Invalid input(s): PCO2, PO2   Studies/Results:  No results found.   Anti-infectives:   Anti-infectives    Start     Dose/Rate Route Frequency Ordered Stop   05/12/14 2200  ceFAZolin (ANCEF) IVPB 2 g/50 mL premix     2 g 100 mL/hr over 30 Minutes Intravenous Every 6 hours 05/12/14 1845 05/13/14 1018   05/12/14 1712  polymyxin B 500,000 Units, bacitracin 50,000 Units in sodium chloride irrigation 0.9 % 500 mL irrigation  Status:  Discontinued       As needed 05/12/14  1712 05/12/14 1735   05/12/14 1215  ceFAZolin (ANCEF) IVPB 2 g/50 mL premix     2 g 100 mL/hr over 30 Minutes Intravenous On call to O.R. 05/12/14 1201 05/12/14 1410      Alphonsa Overall, MD, FACS Pager: [facility] Office: [phone] 05/16/2014

## 2014-05-17 LAB — BASIC METABOLIC PANEL
Anion gap: 9 (ref 5–15)
BUN: 31 mg/dL — ABNORMAL HIGH (ref 6–23)
CHLORIDE: 99 mmol/L (ref 96–112)
CO2: 29 mmol/L (ref 19–32)
Calcium: 8.8 mg/dL (ref 8.4–10.5)
Creatinine, Ser: 1.15 mg/dL (ref 0.50–1.35)
GFR calc Af Amer: 79 mL/min — ABNORMAL LOW (ref >90)
GFR calc non Af Amer: 68 mL/min — ABNORMAL LOW (ref >90)
GLUCOSE: 132 mg/dL — AB (ref 70–99)
POTASSIUM: 4.5 mmol/L (ref 3.5–5.1)
SODIUM: 137 mmol/L (ref 135–145)

## 2014-05-17 MED ORDER — SODIUM CHLORIDE 0.9 % IV BOLUS (SEPSIS)
500.0000 mL | Freq: Once | INTRAVENOUS | Status: AC
  Administered 2014-05-17: 500 mL via INTRAVENOUS

## 2014-05-17 MED ORDER — LIDOCAINE HCL 2 % EX GEL
1.0000 "application " | Freq: Once | CUTANEOUS | Status: DC
  Filled 2014-05-17: qty 5

## 2014-05-17 NOTE — Progress Notes (Addendum)
General Surgery Note  LOS: 5 days  POD -  5 Days Post-Op  Assessment/Plan: 1.  OPEN REPAIR OF INCISIONAL HERNIA REPAIR, WITH INSERTION OF MESH - 3/223/2016 - [name]  Post op ileus  Vomited last night - feels miserable  Will place NGT, increase IVF and check labs in AM  [addendum:  Initial attempts this AM to place NGT unsuccessful and patient refused.  But he felt worse this afternoon - I was in [place] successfully passed NGT.  1,500 cc out so far.  Discussed with patient and wife.  1600 - DN]  2.  HTN 3.  DVT prophylaxis - SQ Heparin   Principal Problem:   Incisional hernia s/p open repair w mesh 05/13/2014 Active Problems:   Essential hypertension   Tobacco abuse   Subjective:  Vomited last PM.  Says he passes small flatus when he urinates, but no BM.  Wife in room. Objective:   Filed Vitals:   05/17/14 0736  BP: 126/81  Pulse: 64  Temp: 98.6 F (37 C)  Resp: 18     Intake/Output from previous day:  03/26 0701 - 03/27 0700 In: 2320 [P.O.:20; I.V.:2300] Out: 5038 [Urine:600; Emesis/NG output:1050]  Intake/Output this shift:      Physical Exam:   General: WN WM who is alert and oriented.    HEENT: Normal. Pupils equal. .   Lungs: Clear.  IS - 1,500 cc.   Abdomen: Distended.  Rare BS.   Wound: Clean   Lab Results:     Recent Labs  05/15/14 0435  WBC 11.8*  HGB 13.7  HCT 40.4  PLT 212    BMET    Recent Labs  05/15/14 0435 05/17/14 0516  NA 137 137  K 4.3 4.5  CL 101 99  CO2 28 29  GLUCOSE 134* 132*  BUN 13 31*  CREATININE 1.07 1.15  CALCIUM 8.7 8.8    PT/INR  No results for input(s): LABPROT, INR in the last 72 hours.  ABG  No results for input(s): PHART, HCO3 in the last 72 hours.  Invalid input(s): PCO2, PO2   Studies/Results:  No results found.   Anti-infectives:   Anti-infectives    Start     Dose/Rate Route Frequency Ordered Stop   05/12/14 2200  ceFAZolin (ANCEF) IVPB 2 g/50 mL premix     2 g 100 mL/hr over 30  Minutes Intravenous Every 6 hours 05/12/14 1845 05/13/14 1018   05/12/14 1712  polymyxin B 500,000 Units, bacitracin 50,000 Units in sodium chloride irrigation 0.9 % 500 mL irrigation  Status:  Discontinued       As needed 05/12/14 1712 05/12/14 1735   05/12/14 1215  ceFAZolin (ANCEF) IVPB 2 g/50 mL premix     2 g 100 mL/hr over 30 Minutes Intravenous On call to O.R. 05/12/14 1201 05/12/14 1410      Alphonsa Overall, MD, FACS Pager: [facility] Office: [phone] 05/17/2014

## 2014-05-18 ENCOUNTER — Inpatient Hospital Stay (HOSPITAL_COMMUNITY): Payer: 59

## 2014-05-18 LAB — BASIC METABOLIC PANEL
ANION GAP: 10 (ref 5–15)
BUN: 48 mg/dL — ABNORMAL HIGH (ref 6–23)
CALCIUM: 8.2 mg/dL — AB (ref 8.4–10.5)
CO2: 25 mmol/L (ref 19–32)
CREATININE: 3.28 mg/dL — AB (ref 0.50–1.35)
Chloride: 102 mmol/L (ref 96–112)
GFR calc non Af Amer: 19 mL/min — ABNORMAL LOW (ref >90)
GFR, EST AFRICAN AMERICAN: 22 mL/min — AB (ref >90)
Glucose, Bld: 142 mg/dL — ABNORMAL HIGH (ref 70–99)
Potassium: 5 mmol/L (ref 3.5–5.1)
Sodium: 137 mmol/L (ref 135–145)

## 2014-05-18 LAB — CBC WITH DIFFERENTIAL/PLATELET
BASOS ABS: 0 10*3/uL (ref 0.0–0.1)
Basophils Relative: 0 % (ref 0–1)
Eosinophils Absolute: 0.1 10*3/uL (ref 0.0–0.7)
Eosinophils Relative: 1 % (ref 0–5)
HCT: 26.4 % — ABNORMAL LOW (ref 39.0–52.0)
Hemoglobin: 8.8 g/dL — ABNORMAL LOW (ref 13.0–17.0)
Lymphocytes Relative: 17 % (ref 12–46)
Lymphs Abs: 1.6 10*3/uL (ref 0.7–4.0)
MCH: 30.1 pg (ref 26.0–34.0)
MCHC: 33.3 g/dL (ref 30.0–36.0)
MCV: 90.4 fL (ref 78.0–100.0)
Monocytes Absolute: 1.4 10*3/uL — ABNORMAL HIGH (ref 0.1–1.0)
Monocytes Relative: 15 % — ABNORMAL HIGH (ref 3–12)
NEUTROS ABS: 6.5 10*3/uL (ref 1.7–7.7)
NEUTROS PCT: 67 % (ref 43–77)
Platelets: 311 10*3/uL (ref 150–400)
RBC: 2.92 MIL/uL — AB (ref 4.22–5.81)
RDW: 13.7 % (ref 11.5–15.5)
WBC: 9.7 10*3/uL (ref 4.0–10.5)

## 2014-05-18 MED ORDER — CHLORHEXIDINE GLUCONATE 0.12 % MT SOLN
15.0000 mL | Freq: Two times a day (BID) | OROMUCOSAL | Status: DC
  Administered 2014-05-18 – 2014-05-22 (×10): 15 mL via OROMUCOSAL
  Filled 2014-05-18 (×12): qty 15

## 2014-05-18 MED ORDER — PHENOL 1.4 % MT LIQD
1.0000 | OROMUCOSAL | Status: DC | PRN
  Administered 2014-05-18: 1 via OROMUCOSAL
  Filled 2014-05-18: qty 177

## 2014-05-18 MED ORDER — CETYLPYRIDINIUM CHLORIDE 0.05 % MT LIQD
7.0000 mL | Freq: Two times a day (BID) | OROMUCOSAL | Status: DC
Start: 2014-05-18 — End: 2014-05-23
  Administered 2014-05-19 – 2014-05-22 (×6): 7 mL via OROMUCOSAL

## 2014-05-18 NOTE — Progress Notes (Signed)
6 Days Post-Op  Subjective: Events reviewed and discussed with Dr Lucia Gaskins; hypotensive overnight. No flatus. Feels somewhat better. C/o sore throat  Objective: Vital signs in last 24 hours: Temp:  [97.6 F (36.4 C)-98.1 F (36.7 C)] 97.8 F (36.6 C) 2022/06/08 0628) Pulse Rate:  [60-77] 77 06/08/22 0628) Resp:  [18] 18 06/08/22 0628) BP: (74-98)/(36-57) 91/48 mmHg 06-08-2022 0628) SpO2:  [93 %-96 %] 93 % 06/08/2022 0628) Last BM Date: 05/11/14  Intake/Output from previous day: 03/27 0701 - 06-08-2022 0700 In: 3308.8 [I.V.:2808.8; IV Piggyback:500] Out: 2500 [Urine:150; Emesis/NG output:2350] Intake/Output this shift: Total I/O In: -  Out: 200 [Urine:200]  Alert, nontoxic, sitting in chair cta b/l Reg Soft, distended; not as full as late last week. Incision c/d/i; hypoBS No edema  Lab Results:   Recent Labs  06-08-2014 0445  WBC 9.7  HGB 8.8*  HCT 26.4*  PLT 311   BMET  Recent Labs  05/17/14 0516 2014/06/08 0445  NA 137 137  K 4.5 5.0  CL 99 102  CO2 29 25  GLUCOSE 132* 142*  BUN 31* 48*  CREATININE 1.15 3.28*  CALCIUM 8.8 8.2*   PT/INR No results for input(s): LABPROT, INR in the last 72 hours. ABG No results for input(s): PHART, HCO3 in the last 72 hours.  Invalid input(s): PCO2, PO2  Studies/Results: Dg Abd 2 Views  2014/06/08   CLINICAL DATA:  Post- operative ileus with nausea vomiting and abdominal pain following open repair of an incisional hernia which with mesh placement.  EXAM: ABDOMEN - 2 VIEW  COMPARISON:  Abdominal pelvic CT scan of April 01, 2014  FINDINGS: There are loops of mildly distended gas-filled small bowel in the upper and mid abdomen especially on the left. There is stool and a small amount of gas in the right colon. No free extraluminal gas collections are demonstrated. The esophagogastric tube tip lies in the region of the gastric cardia with a moderate amount of the tube coiled in the gastric fundus. There surgical clips in the gallbladder fossa and  midline lower abdominal and upper pelvis skin staples.  IMPRESSION: Findings consistent with a small bowel ileus or partial mid small bowel obstruction. There is no evidence of perforation. The esophagogastric tube is in reasonable position.   Electronically Signed   By: David  Martinique   On: 06-08-14 08:02    Anti-infectives: Anti-infectives    Start     Dose/Rate Route Frequency Ordered Stop   05/12/14 2200  ceFAZolin (ANCEF) IVPB 2 g/50 mL premix     2 g 100 mL/hr over 30 Minutes Intravenous Every 6 hours 05/12/14 1845 05/13/14 1018   05/12/14 1712  polymyxin B 500,000 Units, bacitracin 50,000 Units in sodium chloride irrigation 0.9 % 500 mL irrigation  Status:  Discontinued       As needed 05/12/14 1712 05/12/14 1735   05/12/14 1215  ceFAZolin (ANCEF) IVPB 2 g/50 mL premix     2 g 100 mL/hr over 30 Minutes Intravenous On call to O.R. 05/12/14 1201 05/12/14 1410      Assessment/Plan: s/p Procedure(s): OPEN REPAIR OF INCISIONAL HERNIA REPAIR (N/A) WITH INSERTION OF MESH (N/A)  Ileus - cont bowel rest, npo xcept ice, water, popsicle. Await return of bowel function Acute blood loss anemia - probably from toradol, hold subcu heparin,  Acute Kidney Injury - prob mix of pre-renal and renal - hypotension, resumed mIVF. Strict I&O. Will give another bolus; repeat labs in am CV- hold antihypertensives due to hypoTN, hold lisinopril  also bc of AKI VTE prophylaxis - scds only due to bleeding  No fever, no wbc so will hold off on CT for now  Probably bled into rectrorectus space which contributed to volume status and ileus  [name], MD, FACS General, Bariatric, & Minimally Invasive Surgery [facility], [place]   LOS: 6 days    Gayland Curry 05/18/2014

## 2014-05-18 NOTE — Progress Notes (Signed)
Pt had low bp of 74/50. Notified MD on call, was given order to administer a 500 cc NS bolus. Rechecked after bolus, bp increased to 98/54. Will continue to monitor.

## 2014-05-19 ENCOUNTER — Inpatient Hospital Stay (HOSPITAL_COMMUNITY): Payer: 59

## 2014-05-19 LAB — CBC
HCT: 23.2 % — ABNORMAL LOW (ref 39.0–52.0)
Hemoglobin: 7.9 g/dL — ABNORMAL LOW (ref 13.0–17.0)
MCH: 30.9 pg (ref 26.0–34.0)
MCHC: 34.1 g/dL (ref 30.0–36.0)
MCV: 90.6 fL (ref 78.0–100.0)
Platelets: 254 10*3/uL (ref 150–400)
RBC: 2.56 MIL/uL — ABNORMAL LOW (ref 4.22–5.81)
RDW: 13.6 % (ref 11.5–15.5)
WBC: 9 10*3/uL (ref 4.0–10.5)

## 2014-05-19 LAB — MAGNESIUM: MAGNESIUM: 2.6 mg/dL — AB (ref 1.5–2.5)

## 2014-05-19 LAB — BASIC METABOLIC PANEL
Anion gap: 6 (ref 5–15)
BUN: 42 mg/dL — ABNORMAL HIGH (ref 6–23)
CALCIUM: 8 mg/dL — AB (ref 8.4–10.5)
CO2: 27 mmol/L (ref 19–32)
CREATININE: 1.71 mg/dL — AB (ref 0.50–1.35)
Chloride: 104 mmol/L (ref 96–112)
GFR, EST AFRICAN AMERICAN: 49 mL/min — AB (ref >90)
GFR, EST NON AFRICAN AMERICAN: 42 mL/min — AB (ref >90)
Glucose, Bld: 139 mg/dL — ABNORMAL HIGH (ref 70–99)
Potassium: 4.8 mmol/L (ref 3.5–5.1)
Sodium: 137 mmol/L (ref 135–145)

## 2014-05-19 MED ORDER — IOHEXOL 300 MG/ML  SOLN
25.0000 mL | INTRAMUSCULAR | Status: AC
  Administered 2014-05-19 (×2): 25 mL via ORAL

## 2014-05-19 MED ORDER — SALINE SPRAY 0.65 % NA SOLN
1.0000 | NASAL | Status: DC | PRN
  Filled 2014-05-19: qty 44

## 2014-05-19 MED ORDER — MENTHOL 3 MG MT LOZG
1.0000 | LOZENGE | OROMUCOSAL | Status: DC | PRN
  Filled 2014-05-19 (×2): qty 9

## 2014-05-19 NOTE — Progress Notes (Signed)
Notified MD Gerkin of the results of CT Scan Neta Mends RN 05-19-2014 17:59pm

## 2014-05-19 NOTE — Progress Notes (Signed)
7 Days Post-Op  Subjective: BP better no hypoTN. uop better. Some flatus. Still with some pain. Sore throat and dry nose. Ambulated 3x already today  Objective: Vital signs in last 24 hours: Temp:  [98.5 F (36.9 C)-99.3 F (37.4 C)] 98.5 F (36.9 C) (03/29 0609) Pulse Rate:  [82-87] 82 (03/29 0609) Resp:  [16-18] 18 (03/29 0609) BP: (90-128)/(52-66) 123/66 mmHg (03/29 0609) SpO2:  [96 %-97 %] 96 % (03/29 0609) Last BM Date: 06/01/14  Intake/Output from previous day: June 01, 2022 0701 - 03/29 0700 In: 3320 [I.V.:3000; NG/GT:320] Out: 2450 [Urine:1850; Emesis/NG output:600] Intake/Output this shift: Total I/O In: 500 [I.V.:500] Out: 150 [Emesis/NG output:150]  Sitting in chair, nontoxic cta b/l Reg Soft, distended. HypoBS. Incisions c/d/i; no cellulitis No edema  Lab Results:   Recent Labs  06-01-14 0445 05/19/14 0715  WBC 9.7 9.0  HGB 8.8* 7.9*  HCT 26.4* 23.2*  PLT 311 254   BMET  Recent Labs  2014-06-01 0445 05/19/14 0715  NA 137 137  K 5.0 4.8  CL 102 104  CO2 25 27  GLUCOSE 142* 139*  BUN 48* 42*  CREATININE 3.28* 1.71*  CALCIUM 8.2* 8.0*   PT/INR No results for input(s): LABPROT, INR in the last 72 hours. ABG No results for input(s): PHART, HCO3 in the last 72 hours.  Invalid input(s): PCO2, PO2  Studies/Results: Dg Abd 2 Views  06/01/2014   CLINICAL DATA:  Post- operative ileus with nausea vomiting and abdominal pain following open repair of an incisional hernia which with mesh placement.  EXAM: ABDOMEN - 2 VIEW  COMPARISON:  Abdominal pelvic CT scan of April 01, 2014  FINDINGS: There are loops of mildly distended gas-filled small bowel in the upper and mid abdomen especially on the left. There is stool and a small amount of gas in the right colon. No free extraluminal gas collections are demonstrated. The esophagogastric tube tip lies in the region of the gastric cardia with a moderate amount of the tube coiled in the gastric fundus. There surgical  clips in the gallbladder fossa and midline lower abdominal and upper pelvis skin staples.  IMPRESSION: Findings consistent with a small bowel ileus or partial mid small bowel obstruction. There is no evidence of perforation. The esophagogastric tube is in reasonable position.   Electronically Signed   By: David  Martinique   On: June 01, 2014 08:02   Dg Abd Portable 1v  05/19/2014   CLINICAL DATA:  Abdominal pain, postoperative day 7 following open repair of an incisional hernia with mesh placement  EXAM: PORTABLE ABDOMEN - 1 VIEW  COMPARISON:  Abdominal series of 06/01/14  FINDINGS: There remain loops of moderately distended gas-filled small bowel in the mid and upper abdomen to the left. There small amounts of colonic gas. No rectal gas is demonstrated. The nasogastric tube remains coiled in the stomach with the tip above the superior margin of the image likely still lying in the gastric cardia.  IMPRESSION: There has been little change in the appearance of the abdomen since yesterday's study with findings consistent with a partial mid to distal small bowel obstruction.  Abdominal and pelvic CT scanning may be useful.   Electronically Signed   By: David  Martinique   On: 05/19/2014 08:10    Anti-infectives: Anti-infectives    Start     Dose/Rate Route Frequency Ordered Stop   05/12/14 2200  ceFAZolin (ANCEF) IVPB 2 g/50 mL premix     2 g 100 mL/hr over 30 Minutes Intravenous  Every 6 hours 05/12/14 1845 05/13/14 1018   05/12/14 1712  polymyxin B 500,000 Units, bacitracin 50,000 Units in sodium chloride irrigation 0.9 % 500 mL irrigation  Status:  Discontinued       As needed 05/12/14 1712 05/12/14 1735   05/12/14 1215  ceFAZolin (ANCEF) IVPB 2 g/50 mL premix     2 g 100 mL/hr over 30 Minutes Intravenous On call to O.R. 05/12/14 1201 05/12/14 1410      Assessment/Plan: s/p Procedure(s): OPEN REPAIR OF INCISIONAL HERNIA REPAIR (N/A) WITH INSERTION OF MESH (N/A)  Ileus - abd xray unchanged. Has  some flatus. Will check CT abd/pelv without IV contrast; cont NG tube for now;  Add lozenge and nasal spray for sore throat and dry nose ABL anemia - hgb/hct down 1 and 3 points respectively. No tachycardia. BP normal. Doubt all dilutional. Repeat CBC in am AKI - Cr better. uop ok. Repeat in am DVT prophylaxis - scds only; hold chemical vte prophylaxis due to ongoing drop in hgb/hct.  Gave update to pt and wife.   [name], MD, FACS General, Bariatric, & Minimally Invasive Surgery [facility], [place]   LOS: 7 days    Gayland Curry 05/19/2014

## 2014-05-20 LAB — BASIC METABOLIC PANEL
Anion gap: 6 (ref 5–15)
BUN: 26 mg/dL — ABNORMAL HIGH (ref 6–23)
CHLORIDE: 101 mmol/L (ref 96–112)
CO2: 28 mmol/L (ref 19–32)
Calcium: 8.1 mg/dL — ABNORMAL LOW (ref 8.4–10.5)
Creatinine, Ser: 1.18 mg/dL (ref 0.50–1.35)
GFR calc Af Amer: 76 mL/min — ABNORMAL LOW (ref >90)
GFR calc non Af Amer: 66 mL/min — ABNORMAL LOW (ref >90)
Glucose, Bld: 122 mg/dL — ABNORMAL HIGH (ref 70–99)
POTASSIUM: 4.7 mmol/L (ref 3.5–5.1)
Sodium: 135 mmol/L (ref 135–145)

## 2014-05-20 LAB — CBC
HCT: 22.9 % — ABNORMAL LOW (ref 39.0–52.0)
Hemoglobin: 7.7 g/dL — ABNORMAL LOW (ref 13.0–17.0)
MCH: 30.7 pg (ref 26.0–34.0)
MCHC: 33.6 g/dL (ref 30.0–36.0)
MCV: 91.2 fL (ref 78.0–100.0)
Platelets: 263 10*3/uL (ref 150–400)
RBC: 2.51 MIL/uL — AB (ref 4.22–5.81)
RDW: 13.6 % (ref 11.5–15.5)
WBC: 9.7 10*3/uL (ref 4.0–10.5)

## 2014-05-20 NOTE — Progress Notes (Signed)
Pt is alert and oriented, vital signs are stable, incision within normal limits, patient has been up and ambulating around the nursing station several times this shift, patient with 2 small bowel movements this shift, NG Tube clamped and patient is tolerating clamping trial thus far Murlene Revell N 7:00 PM 05-20-14

## 2014-05-20 NOTE — Progress Notes (Signed)
8 Days Post-Op  Subjective: Still c/o NG tube & abd pain. A little flatus; NG 500 - bilious  Objective: Vital signs in last 24 hours: Temp:  [98.3 F (36.8 C)-100.4 F (38 C)] 99.2 F (37.3 C) (03/30 0518) Pulse Rate:  [73-87] 73 (03/30 0518) Resp:  [16-18] 16 (03/30 0518) BP: (133-152)/(53-74) 134/59 mmHg (03/30 0518) SpO2:  [95 %-99 %] 95 % (03/30 0518) Last BM Date: 05/18/14  Intake/Output from previous day: 03/29 0701 - 03/30 0700 In: 3050 [I.V.:3000] Out: 2825 [Urine:2275; Emesis/NG output:550] Intake/Output this shift:    Alert, sitting in chair, appears a little uncomfortable cta b/l Reg Soft, distended, incision c/d/i; hypoBS; no cellulitis No edema  Lab Results:   Recent Labs  05/19/14 0715 05/20/14 0515  WBC 9.0 9.7  HGB 7.9* 7.7*  HCT 23.2* 22.9*  PLT 254 263   BMET  Recent Labs  05/19/14 0715 05/20/14 0515  NA 137 135  K 4.8 4.7  CL 104 101  CO2 27 28  GLUCOSE 139* 122*  BUN 42* 26*  CREATININE 1.71* 1.18  CALCIUM 8.0* 8.1*   PT/INR No results for input(s): LABPROT, INR in the last 72 hours. ABG No results for input(s): PHART, HCO3 in the last 72 hours.  Invalid input(s): PCO2, PO2  Studies/Results: Ct Abdomen Pelvis Wo Contrast  05/19/2014   CLINICAL DATA:  Generalized abdominal pain and distention. Fever. Postop from hernia repair. Renal insufficiency.  EXAM: CT ABDOMEN AND PELVIS WITHOUT CONTRAST  TECHNIQUE: Multidetector CT imaging of the abdomen and pelvis was performed following the standard protocol without IV contrast.  COMPARISON:  04/01/2014  FINDINGS: Lower chest: Increased atelectasis or infiltrate is seen in both lower lobes compared to previous study.  Hepatobiliary: No mass visualized on this unenhanced exam. Prior cholecystectomy again noted.  Pancreas: No mass or inflammatory process visualized on this unenhanced exam.  Spleen:  Within normal limits in size.  Adrenal Glands: 2.8 cm left adrenal mass is stable and consistent  with benign adenoma. Right adrenal gland remains normal in appearance.  Kidneys/Urinary tract: No evidence of urolithiasis or hydronephrosis.  Stomach/Bowel/Peritoneum: Nasogastric tube is now seen within the stomach. Mild dilatation of small bowel loops is seen. Oral contrast reaches the right colon. No discrete transition point is identified. This favors a postop ileus over a small bowel obstruction.  Vascular/Lymphatic: No pathologically enlarged lymph nodes identified. No other significant abnormality identified.  Reproductive:  No mass or other significant abnormality noted.  Other: A large anterior abdominal wall hematoma is seen within the rectus sheath, which is new since previous study. This measures approximately 18.8 x 5.6 x 19.5 cm. Skin staples are seen in the anterior abdominal wall. No evidence of recurrent ventral hernia.  Musculoskeletal:  No suspicious bone lesions identified.  IMPRESSION: New large anterior abdominal wall rectus sheath hematoma measuring approximately 18.8 x 5.6 x 19.5 cm. No evidence of recurrent ventral hernia.  Probable postop small bowel ileus. No definite evidence of small bowel obstruction or abscess.  Increased bibasilar atelectasis versus infiltrates.  Stable benign left adrenal adenoma.  These results will be called to the ordering clinician or representative by the Radiologist Assistant, and communication documented in the PACS or zVision Dashboard.   Electronically Signed   By: Earle Gell M.D.   On: 05/19/2014 17:22   Dg Abd Portable 1v  05/19/2014   CLINICAL DATA:  Abdominal pain, postoperative day 7 following open repair of an incisional hernia with mesh placement  EXAM: PORTABLE ABDOMEN -  1 VIEW  COMPARISON:  Abdominal series of May 18, 2014  FINDINGS: There remain loops of moderately distended gas-filled small bowel in the mid and upper abdomen to the left. There small amounts of colonic gas. No rectal gas is demonstrated. The nasogastric tube remains coiled  in the stomach with the tip above the superior margin of the image likely still lying in the gastric cardia.  IMPRESSION: There has been little change in the appearance of the abdomen since yesterday's study with findings consistent with a partial mid to distal small bowel obstruction.  Abdominal and pelvic CT scanning may be useful.   Electronically Signed   By: David  Martinique   On: 05/19/2014 08:10    Anti-infectives: Anti-infectives    Start     Dose/Rate Route Frequency Ordered Stop   05/12/14 2200  ceFAZolin (ANCEF) IVPB 2 g/50 mL premix     2 g 100 mL/hr over 30 Minutes Intravenous Every 6 hours 05/12/14 1845 05/13/14 1018   05/12/14 1712  polymyxin B 500,000 Units, bacitracin 50,000 Units in sodium chloride irrigation 0.9 % 500 mL irrigation  Status:  Discontinued       As needed 05/12/14 1712 05/12/14 1735   05/12/14 1215  ceFAZolin (ANCEF) IVPB 2 g/50 mL premix     2 g 100 mL/hr over 30 Minutes Intravenous On call to O.R. 05/12/14 1201 05/12/14 1410      Assessment/Plan: s/p Procedure(s): OPEN REPAIR OF INCISIONAL HERNIA REPAIR (N/A) WITH INSERTION OF MESH (N/A)   Ileus - cont bowel rest, NG tube to LIWS; output going down; CT yesterday showed contrast in rt colon so no obstruction; hopefully clamp this pm or in evening AKI - cr back to normal. uop good ABL anemia - hgb now stable. If hgb stable again in AM, will restart subcu heparin  [name], MD, FACS General, Bariatric, & Minimally Invasive Surgery [facility], [place]   LOS: 8 days    Gayland Curry 05/20/2014

## 2014-05-21 ENCOUNTER — Inpatient Hospital Stay (HOSPITAL_COMMUNITY): Payer: 59

## 2014-05-21 LAB — BASIC METABOLIC PANEL
ANION GAP: 7 (ref 5–15)
BUN: 19 mg/dL (ref 6–23)
CALCIUM: 8.6 mg/dL (ref 8.4–10.5)
CO2: 28 mmol/L (ref 19–32)
Chloride: 98 mmol/L (ref 96–112)
Creatinine, Ser: 1.01 mg/dL (ref 0.50–1.35)
GFR calc Af Amer: >90 mL/min (ref >90)
GFR, EST NON AFRICAN AMERICAN: 79 mL/min — AB (ref >90)
Glucose, Bld: 121 mg/dL — ABNORMAL HIGH (ref 70–99)
Potassium: 4.4 mmol/L (ref 3.5–5.1)
Sodium: 133 mmol/L — ABNORMAL LOW (ref 135–145)

## 2014-05-21 LAB — CBC
HCT: 24 % — ABNORMAL LOW (ref 39.0–52.0)
Hemoglobin: 7.9 g/dL — ABNORMAL LOW (ref 13.0–17.0)
MCH: 29.8 pg (ref 26.0–34.0)
MCHC: 32.9 g/dL (ref 30.0–36.0)
MCV: 90.6 fL (ref 78.0–100.0)
PLATELETS: 347 10*3/uL (ref 150–400)
RBC: 2.65 MIL/uL — ABNORMAL LOW (ref 4.22–5.81)
RDW: 13.4 % (ref 11.5–15.5)
WBC: 12 10*3/uL — ABNORMAL HIGH (ref 4.0–10.5)

## 2014-05-21 LAB — MAGNESIUM: Magnesium: 2.1 mg/dL (ref 1.5–2.5)

## 2014-05-21 MED ORDER — METOCLOPRAMIDE HCL 5 MG/ML IJ SOLN
5.0000 mg | Freq: Three times a day (TID) | INTRAMUSCULAR | Status: DC
  Administered 2014-05-21 – 2014-05-23 (×6): 5 mg via INTRAVENOUS
  Filled 2014-05-21: qty 1
  Filled 2014-05-21: qty 2
  Filled 2014-05-21 (×2): qty 1
  Filled 2014-05-21: qty 2
  Filled 2014-05-21 (×4): qty 1

## 2014-05-21 NOTE — Consult Note (Signed)
   [facility] CM Inpatient Consult   05/21/2014  Ernest Hudson [birth year] [id number]   Came to visit patient on behalf of [employer] program for [employer] employees/dependents with [employer]. Patient not up to having much conversation at the present moment. Therefore, briefly explained [employer] program for HTN management if he felt like he needed it. Left brochure and contact information at bedside. Will come back at later time when patient feels like talking. Will make inpatient RNCM aware of visit.   Marthenia Rolling, MSN-Ed, RN,BSN [facility] Liaison [phone]

## 2014-05-21 NOTE — Progress Notes (Signed)
9 Days Post-Op  Subjective: Minimal nausea/ +flatus. 2 sm BMs. Pain a little better. NG clamped last night and no worsening pain or nausea  Objective: Vital signs in last 24 hours: Temp:  [98.2 F (36.8 C)-99.1 F (37.3 C)] 99.1 F (37.3 C) (03/31 0455) Pulse Rate:  [72-79] 79 (03/31 0455) Resp:  [16-18] 18 (03/31 0455) BP: (119-137)/(59-63) 137/59 mmHg (03/31 0455) SpO2:  [97 %-99 %] 99 % (03/31 0455) Last BM Date: 05/18/14 (pt reports passing gas with some stool present)  Intake/Output from previous day: 03/30 0701 - 03/31 0700 In: 2125 [I.V.:2125] Out: 2550 [Urine:2150; Emesis/NG output:400] Intake/Output this shift: Total I/O In: -  Out: 400 [Urine:400]  Alert, nontoxic, in chair, looks better cta b/l Reg Soft, distended, incision c/d/i; BS a little better today  Lab Results:   Recent Labs  05/20/14 0515 05/21/14 0453  WBC 9.7 12.0*  HGB 7.7* 7.9*  HCT 22.9* 24.0*  PLT 263 347   BMET  Recent Labs  05/20/14 0515 05/21/14 0453  NA 135 133*  K 4.7 4.4  CL 101 98  CO2 28 28  GLUCOSE 122* 121*  BUN 26* 19  CREATININE 1.18 1.01  CALCIUM 8.1* 8.6   PT/INR No results for input(s): LABPROT, INR in the last 72 hours. ABG No results for input(s): PHART, HCO3 in the last 72 hours.  Invalid input(s): PCO2, PO2  Studies/Results: Ct Abdomen Pelvis Wo Contrast  05/19/2014   CLINICAL DATA:  Generalized abdominal pain and distention. Fever. Postop from hernia repair. Renal insufficiency.  EXAM: CT ABDOMEN AND PELVIS WITHOUT CONTRAST  TECHNIQUE: Multidetector CT imaging of the abdomen and pelvis was performed following the standard protocol without IV contrast.  COMPARISON:  04/01/2014  FINDINGS: Lower chest: Increased atelectasis or infiltrate is seen in both lower lobes compared to previous study.  Hepatobiliary: No mass visualized on this unenhanced exam. Prior cholecystectomy again noted.  Pancreas: No mass or inflammatory process visualized on this unenhanced  exam.  Spleen:  Within normal limits in size.  Adrenal Glands: 2.8 cm left adrenal mass is stable and consistent with benign adenoma. Right adrenal gland remains normal in appearance.  Kidneys/Urinary tract: No evidence of urolithiasis or hydronephrosis.  Stomach/Bowel/Peritoneum: Nasogastric tube is now seen within the stomach. Mild dilatation of small bowel loops is seen. Oral contrast reaches the right colon. No discrete transition point is identified. This favors a postop ileus over a small bowel obstruction.  Vascular/Lymphatic: No pathologically enlarged lymph nodes identified. No other significant abnormality identified.  Reproductive:  No mass or other significant abnormality noted.  Other: A large anterior abdominal wall hematoma is seen within the rectus sheath, which is new since previous study. This measures approximately 18.8 x 5.6 x 19.5 cm. Skin staples are seen in the anterior abdominal wall. No evidence of recurrent ventral hernia.  Musculoskeletal:  No suspicious bone lesions identified.  IMPRESSION: New large anterior abdominal wall rectus sheath hematoma measuring approximately 18.8 x 5.6 x 19.5 cm. No evidence of recurrent ventral hernia.  Probable postop small bowel ileus. No definite evidence of small bowel obstruction or abscess.  Increased bibasilar atelectasis versus infiltrates.  Stable benign left adrenal adenoma.  These results will be called to the ordering clinician or representative by the Radiologist Assistant, and communication documented in the PACS or zVision Dashboard.   Electronically Signed   By: Earle Gell M.D.   On: 05/19/2014 17:22   Dg Abd Portable 1v  05/21/2014   CLINICAL DATA:  Follow-up small  bowel obstruction  EXAM: PORTABLE ABDOMEN - 1 VIEW  COMPARISON:  CT scan of the abdomen and pelvis and abdominal flat plate of 18 May 4852  FINDINGS: The nasogastric tube is coiled in the stomach with the tip projected into the cardia. There remain loops of moderately  distended gas-filled small bowel in the mid abdomen. Contrast is present in the relatively collapsed colon. No free extraluminal gas collections are demonstrated. There is no significant rectal gas.  IMPRESSION: Persistent partial mid to distal small bowel obstruction. There is no evidence of perforation.   Electronically Signed   By: David  Martinique   On: 05/21/2014 07:42    Anti-infectives: Anti-infectives    Start     Dose/Rate Route Frequency Ordered Stop   05/12/14 2200  ceFAZolin (ANCEF) IVPB 2 g/50 mL premix     2 g 100 mL/hr over 30 Minutes Intravenous Every 6 hours 05/12/14 1845 05/13/14 1018   05/12/14 1712  polymyxin B 500,000 Units, bacitracin 50,000 Units in sodium chloride irrigation 0.9 % 500 mL irrigation  Status:  Discontinued       As needed 05/12/14 1712 05/12/14 1735   05/12/14 1215  ceFAZolin (ANCEF) IVPB 2 g/50 mL premix     2 g 100 mL/hr over 30 Minutes Intravenous On call to O.R. 05/12/14 1201 05/12/14 1410      Assessment/Plan: s/p Procedure(s): OPEN REPAIR OF INCISIONAL HERNIA REPAIR (N/A) WITH INSERTION OF MESH (N/A)  Postop rectus sheath hematoma - hgb stable.  Ileus - SB still somewhat dilated on xray but contrast progressing thru colon. Tolerated NG clamp. Minimal residual when reconnected to suction this am. Will leave NG in (clamped) and let have clears today. Will start some reglan as well SCDs only  [name], MD, FACS General, Bariatric, & Minimally Invasive Surgery [facility], [place]   LOS: 9 days    Gayland Curry 05/21/2014

## 2014-05-22 LAB — BASIC METABOLIC PANEL
Anion gap: 8 (ref 5–15)
BUN: 13 mg/dL (ref 6–23)
CALCIUM: 8.5 mg/dL (ref 8.4–10.5)
CO2: 27 mmol/L (ref 19–32)
CREATININE: 1.01 mg/dL (ref 0.50–1.35)
Chloride: 100 mmol/L (ref 96–112)
GFR calc non Af Amer: 79 mL/min — ABNORMAL LOW (ref >90)
Glucose, Bld: 150 mg/dL — ABNORMAL HIGH (ref 70–99)
POTASSIUM: 4.2 mmol/L (ref 3.5–5.1)
Sodium: 135 mmol/L (ref 135–145)

## 2014-05-22 MED ORDER — BOOST PLUS PO LIQD
237.0000 mL | Freq: Three times a day (TID) | ORAL | Status: DC
  Administered 2014-05-22 – 2014-05-23 (×4): 237 mL via ORAL
  Filled 2014-05-22 (×6): qty 237

## 2014-05-22 NOTE — Progress Notes (Signed)
10 Days Post-Op  Subjective: Feels better. Tolerated ng clamp while taking clears. No n/v. +flatus. Didn't feel abd got bigger with clears  Objective: Vital signs in last 24 hours: Temp:  [97.4 F (36.3 C)-98.3 F (36.8 C)] 97.4 F (36.3 C) (04/01 0541) Pulse Rate:  [76-83] 83 (04/01 0541) Resp:  [16-18] 16 (04/01 0541) BP: (124-141)/(67-106) 141/73 mmHg (04/01 0541) SpO2:  [93 %-100 %] 100 % (04/01 0541) Last BM Date: 05/21/14  Intake/Output from previous day: 03/31 0701 - 04/01 0700 In: 3240 [P.O.:240; I.V.:3000] Out: 91 [Urine:1900] Intake/Output this shift:    Alert, nontoxic, looks better cta b/l Soft, distended, incision c/d/i; hypoBS No edema  Lab Results:   Recent Labs  05/20/14 0515 05/21/14 0453  WBC 9.7 12.0*  HGB 7.7* 7.9*  HCT 22.9* 24.0*  PLT 263 347   BMET  Recent Labs  05/21/14 0453 05/22/14 0455  NA 133* 135  K 4.4 4.2  CL 98 100  CO2 28 27  GLUCOSE 121* 150*  BUN 19 13  CREATININE 1.01 1.01  CALCIUM 8.6 8.5   PT/INR No results for input(s): LABPROT, INR in the last 72 hours. ABG No results for input(s): PHART, HCO3 in the last 72 hours.  Invalid input(s): PCO2, PO2  Studies/Results: Dg Abd Portable 1v  05/21/2014   CLINICAL DATA:  Follow-up small bowel obstruction  EXAM: PORTABLE ABDOMEN - 1 VIEW  COMPARISON:  CT scan of the abdomen and pelvis and abdominal flat plate of 18 May 6281  FINDINGS: The nasogastric tube is coiled in the stomach with the tip projected into the cardia. There remain loops of moderately distended gas-filled small bowel in the mid abdomen. Contrast is present in the relatively collapsed colon. No free extraluminal gas collections are demonstrated. There is no significant rectal gas.  IMPRESSION: Persistent partial mid to distal small bowel obstruction. There is no evidence of perforation.   Electronically Signed   By: David  Martinique   On: 05/21/2014 07:42    Anti-infectives: Anti-infectives    Start      Dose/Rate Route Frequency Ordered Stop   05/12/14 2200  ceFAZolin (ANCEF) IVPB 2 g/50 mL premix     2 g 100 mL/hr over 30 Minutes Intravenous Every 6 hours 05/12/14 1845 05/13/14 1018   05/12/14 1712  polymyxin B 500,000 Units, bacitracin 50,000 Units in sodium chloride irrigation 0.9 % 500 mL irrigation  Status:  Discontinued       As needed 05/12/14 1712 05/12/14 1735   05/12/14 1215  ceFAZolin (ANCEF) IVPB 2 g/50 mL premix     2 g 100 mL/hr over 30 Minutes Intravenous On call to O.R. 05/12/14 1201 05/12/14 1410      Assessment/Plan: s/p Procedure(s): OPEN REPAIR OF INCISIONAL HERNIA REPAIR (N/A) WITH INSERTION OF MESH (N/A)  D/c ng Clears this am Boost shakes Ambulate, IS [place] Sunday at earliest  Campbell. [name], MD, FACS General, Bariatric, & Minimally Invasive Surgery [facility], [place]   LOS: 10 days    Gayland Curry 05/22/2014

## 2014-05-23 LAB — BASIC METABOLIC PANEL
Anion gap: 7 (ref 5–15)
BUN: 11 mg/dL (ref 6–23)
CALCIUM: 8.2 mg/dL — AB (ref 8.4–10.5)
CO2: 27 mmol/L (ref 19–32)
Chloride: 103 mmol/L (ref 96–112)
Creatinine, Ser: 0.89 mg/dL (ref 0.50–1.35)
GFR calc Af Amer: >90 mL/min (ref >90)
GLUCOSE: 134 mg/dL — AB (ref 70–99)
Potassium: 4.1 mmol/L (ref 3.5–5.1)
Sodium: 137 mmol/L (ref 135–145)

## 2014-05-23 MED ORDER — OXYCODONE HCL 5 MG PO TABS: 5.0000 mg | ORAL_TABLET | ORAL | Status: DC | PRN

## 2014-05-23 NOTE — Discharge Summary (Signed)
Physician Discharge Summary  Patient ID: Ernest Hudson MRN: [id number] DOB/AGE: 60-23-1957 60 y.o.  Admit date: 05/12/2014 Discharge date: 05/23/2014  Admission Diagnoses:  Large complex ventral hernia  Discharge Diagnoses:  Same post lap/open repair  Principal Problem:   Incisional hernia s/p open repair w mesh 05/13/2014 Active Problems:   Essential hypertension   Tobacco abuse   Surgery:  Lap/open repair of complex ventral hernia  Discharged Condition: improved  Hospital Course:   Had surgery per Dr. [name].  Initially did OK but developed a prolonged ileus and rectus hematoma.  With observation, the ileus resolved and the patient was able to begin POs and was eager to get home.  Arrangements were made for discharge on Sat April 2 to return to the office the following Tuesday for staple removal.    Consults: none  Significant Diagnostic Studies: CT scan    Discharge Exam: Blood pressure 128/66, pulse 72, temperature 99.1 F (37.3 C), temperature source Oral, resp. rate 18, height 5\' 10"  (1.778 m), weight 96.389 kg (212 lb 8 oz), SpO2 100 %. Staple line without evidence of infection.  Abdomen is mildly dilated.  Patient having flatus and BMs.  Disposition: 01-Home or Self Care  Discharge Instructions    Diet - low sodium heart healthy    Complete by:  As directed      Discharge instructions    Complete by:  As directed   Neosporin to staple line Staple removal on Tuesday (2 weeks)     Discharge wound care:    Complete by:  As directed   Shower ad lib Call office on Monday [phone] and get nurse only visit for Tuesday for staple removal     Increase activity slowly    Complete by:  As directed             Medication List    TAKE these medications        amLODipine 10 MG tablet  Commonly known as:  NORVASC  TAKE 1 TABLET BY MOUTH ONCE DAILY     aspirin EC 81 MG tablet  Take 81 mg by mouth every morning.     cetirizine 10 MG tablet  Commonly known as:   ZYRTEC  Take 10 mg by mouth every morning.     fluticasone 50 MCG/ACT nasal spray  Commonly known as:  FLONASE  Place 2 sprays into the nose daily as needed. For allergies.     ibuprofen 200 MG tablet  Commonly known as:  ADVIL,MOTRIN  You can take 2-3 of these every 6 hours as needed.     lisinopril 40 MG tablet  Commonly known as:  PRINIVIL,ZESTRIL  TAKE 1 TABLET BY MOUTH DAILY.     magnesium oxide 400 MG tablet  Commonly known as:  MAG-OX  Take 400 mg by mouth every morning.     oxyCODONE 5 MG immediate release tablet  Commonly known as:  Oxy IR/ROXICODONE  Take 1-2 tablets (5-10 mg total) by mouth every 4 (four) hours as needed for moderate pain or severe pain.           Follow-up Information    Follow up with Gayland Curry, MD. Schedule an appointment as soon as possible for a visit in 10 days.   Specialty:  General Surgery   Why:  For suture removal   Contact information:   [address] [place] [address] [phone]       Signed: Pedro Earls 05/23/2014, 9:53 AM

## 2014-05-23 NOTE — Progress Notes (Signed)
Discharge instructions discussed with pt and wife until no further questions ask.Patient  able to answer questions concerning diet, activity and when to call md.

## 2014-08-20 ENCOUNTER — Other Ambulatory Visit: Payer: Self-pay | Admitting: Internal Medicine

## 2014-09-10 ENCOUNTER — Other Ambulatory Visit (INDEPENDENT_AMBULATORY_CARE_PROVIDER_SITE_OTHER): Payer: 59

## 2014-09-10 DIAGNOSIS — Z0000 Encounter for general adult medical examination without abnormal findings: Secondary | ICD-10-CM

## 2014-09-10 LAB — BASIC METABOLIC PANEL
BUN: 15 mg/dL (ref 6–23)
CHLORIDE: 104 meq/L (ref 96–112)
CO2: 29 meq/L (ref 19–32)
Calcium: 9.5 mg/dL (ref 8.4–10.5)
Creatinine, Ser: 0.96 mg/dL (ref 0.40–1.50)
GFR: 84.88 mL/min (ref >60.00)
Glucose, Bld: 99 mg/dL (ref 70–99)
Potassium: 4.4 mEq/L (ref 3.5–5.1)
SODIUM: 142 meq/L (ref 135–145)

## 2014-09-10 LAB — POCT URINALYSIS DIPSTICK
Bilirubin, UA: NEGATIVE
Blood, UA: NEGATIVE
GLUCOSE UA: NEGATIVE
Ketones, UA: NEGATIVE
Leukocytes, UA: NEGATIVE
NITRITE UA: NEGATIVE
PH UA: 5
PROTEIN UA: NEGATIVE
SPEC GRAV UA: 1.02
UROBILINOGEN UA: 0.2

## 2014-09-10 LAB — CBC WITH DIFFERENTIAL/PLATELET
Basophils Absolute: 0 10*3/uL (ref 0.0–0.1)
Basophils Relative: 0.4 % (ref 0.0–3.0)
EOS PCT: 5.9 % — AB (ref 0.0–5.0)
Eosinophils Absolute: 0.4 10*3/uL (ref 0.0–0.7)
HCT: 47.4 % (ref 39.0–52.0)
Hemoglobin: 16 g/dL (ref 13.0–17.0)
LYMPHS ABS: 2.1 10*3/uL (ref 0.7–4.0)
LYMPHS PCT: 34.8 % (ref 12.0–46.0)
MCHC: 33.8 g/dL (ref 30.0–36.0)
MCV: 85 fl (ref 78.0–100.0)
MONO ABS: 0.4 10*3/uL (ref 0.1–1.0)
Monocytes Relative: 6.3 % (ref 3.0–12.0)
Neutro Abs: 3.1 10*3/uL (ref 1.4–7.7)
Neutrophils Relative %: 52.6 % (ref 43.0–77.0)
PLATELETS: 208 10*3/uL (ref 150.0–400.0)
RBC: 5.58 Mil/uL (ref 4.22–5.81)
RDW: 15 % (ref 11.5–15.5)
WBC: 6 10*3/uL (ref 4.0–10.5)

## 2014-09-10 LAB — LIPID PANEL
CHOL/HDL RATIO: 4
Cholesterol: 156 mg/dL (ref 0–200)
HDL: 37.2 mg/dL — ABNORMAL LOW (ref >39.00)
LDL Cholesterol: 103 mg/dL — ABNORMAL HIGH (ref 0–99)
NonHDL: 118.8
Triglycerides: 77 mg/dL (ref 0.0–149.0)
VLDL: 15.4 mg/dL (ref 0.0–40.0)

## 2014-09-10 LAB — PSA: PSA: 1.03 ng/mL (ref 0.10–4.00)

## 2014-09-10 LAB — TSH: TSH: 3.7 u[IU]/mL (ref 0.35–4.50)

## 2014-09-10 LAB — HEPATIC FUNCTION PANEL
ALK PHOS: 84 U/L (ref 39–117)
ALT: 21 U/L (ref 0–53)
AST: 20 U/L (ref 0–37)
Albumin: 4.5 g/dL (ref 3.5–5.2)
Bilirubin, Direct: 0.1 mg/dL (ref 0.0–0.3)
Total Bilirubin: 0.4 mg/dL (ref 0.2–1.2)
Total Protein: 6.9 g/dL (ref 6.0–8.3)

## 2014-09-17 ENCOUNTER — Encounter: Payer: Self-pay | Admitting: Internal Medicine

## 2014-09-17 ENCOUNTER — Ambulatory Visit (INDEPENDENT_AMBULATORY_CARE_PROVIDER_SITE_OTHER): Payer: 59 | Admitting: Internal Medicine

## 2014-09-17 VITALS — BP 130/80 | HR 52 | Temp 98.1°F | Resp 20 | Ht 69.75 in | Wt 199.0 lb

## 2014-09-17 DIAGNOSIS — I10 Essential (primary) hypertension: Secondary | ICD-10-CM | POA: Diagnosis not present

## 2014-09-17 DIAGNOSIS — Z86010 Personal history of colonic polyps: Secondary | ICD-10-CM

## 2014-09-17 DIAGNOSIS — Z0000 Encounter for general adult medical examination without abnormal findings: Secondary | ICD-10-CM | POA: Diagnosis not present

## 2014-09-17 NOTE — Progress Notes (Signed)
Subjective:    Patient ID: Ernest Hudson, male    DOB: [birth year], 59 y.o.   MRN: [id number]  HPI 59 -year-old patient who is seen today for a health maintenance exam.  He has a history of hypertension, allergic rhinitis, and does quite well. He is a former smoker and discontinued tobacco products in June of 2014 He has had a history of a ruptured appendicitis and was hospitalized spring 2016 for hernia repair.  This was complicated by postop ileus   Past Medical History  Diagnosis Date  . Allergic rhinitis   . Hypertension   . Hx of colonic polyps   . Hyperlipidemia   . Bradycardia   . Dyslipidemia (high LDL; low HDL)   . Birt-Hogg-Dube syndrome   . Anginal pain   . Spontaneous pneumothorax 1995  . Adrenal mass     "benign" (04/08/2013  . History of blood transfusion ?1995  . Arthritis     "probably in my fingers" (04/08/2013)  . Basal cell carcinoma 2000's    'chest"  . Ileus, postoperative 04/17/2013  . 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 Marital Status: Married    Spouse Name: N/A  . Number of Children: N/A  . Years of Education: N/A   Occupational History  . Not on file.   Social History Main Topics  . Smoking status: Former Smoker -- 2.00 packs/day for 30 years    Types: Cigarettes    Quit date: 07/30/2012  . Smokeless tobacco: Never Used  . Alcohol Use: No  . Drug Use: No  . Sexual Activity: Not Currently   Other Topics Concern  . Not on file   Social History Narrative    Past Surgical History  Procedure Laterality Date  . External ear surgery  1971  . Colonoscopy  06/2005  . Cardiolyte stress  04/2008  . Tendon repair Left 1990's    "thumb"  . Elbow surgery Left 2001    "tendonitis"  . Knee arthroscopy Left 1990's  . Appendectomy  04/07/2013  . Cholecystectomy  2004  . Lung surgery      "repaired ruptured bleb" '95  . Nasal septum surgery    . Middle ear surgery Left 1972    exploratory  tympanotomy/notes 07/17/2000  . Skin cancer excision  2000's    "chest"  . Laparoscopic appendectomy N/A 04/07/2013    Procedure: ATTEMPTED APPENDECTOMY LAPAROSCOPIC;  Surgeon: Gayland Curry, MD;  Location: [place];  Service: General;  Laterality: N/A;  . Appendectomy  04/07/2013    Procedure: OPEN APPENDECTOMY;  Surgeon: Gayland Curry, MD;  Location: [place];  Service: General;;  . Partial colectomy  04/07/2013    Procedure: PARTIAL COLECTOMY, ILEOCECTOMY;  Surgeon: Gayland Curry, MD;  Location: [place];  Service: General;;  . Incisional hernia repair N/A 05/12/2014    Procedure: OPEN REPAIR OF INCISIONAL HERNIA REPAIR;  Surgeon: Greer Pickerel, MD;  Location: [facility];  Service: General;  Laterality: N/A;  . Insertion of mesh N/A 05/12/2014    Procedure: WITH INSERTION OF MESH;  Surgeon: Greer Pickerel, MD;  Location: [facility];  Service: General;  Laterality: N/A;    Family History  Problem Relation Age of Onset  . Cancer Mother     breast  . Diabetes Mother   . Heart attack Father   . Diabetes Father   . Cancer Father  prostate   . Healthy Sister   . Lung disease Sister     has blebs on MRI,   . Healthy Brother   . Healthy Brother   . Breast cancer Maternal Aunt     diagnosed in her 37s  . Melanoma Maternal Uncle   . Breast cancer Maternal Aunt     diagnosed in her 64s  . Breast cancer Maternal Aunt     diagnosed in her 64s  . Cancer Cousin     multiple myeloma    Allergies  Allergen Reactions  . Niacin     REACTION: rash    Current Outpatient Prescriptions on File Prior to Visit  Medication Sig Dispense Refill  . amLODipine (NORVASC) 10 MG tablet TAKE 1 TABLET BY MOUTH ONCE DAILY 30 tablet 0  . aspirin EC 81 MG tablet Take 81 mg by mouth every morning.     . cetirizine (ZYRTEC) 10 MG tablet Take 10 mg by mouth every morning.     . fluticasone (FLONASE) 50 MCG/ACT nasal spray Place 2 sprays into the nose daily as needed. For allergies. 16 g 6  . ibuprofen (ADVIL,MOTRIN) 200 MG  tablet You can take 2-3 of these every 6 hours as needed. 30 tablet 0  . lisinopril (PRINIVIL,ZESTRIL) 40 MG tablet TAKE 1 TABLET BY MOUTH DAILY. 30 tablet 0  . magnesium oxide (MAG-OX) 400 MG tablet Take 400 mg by mouth every morning.      No current facility-administered medications on file prior to visit.    BP 130/80 mmHg  Pulse 52  Temp(Src) 98.1 F (36.7 C) (Oral)  Resp 20  Ht 5' 9.75" (1.772 m)  Wt 199 lb (90.266 kg)  BMI 28.75 kg/m2  SpO2 99%     Review of Systems  Constitutional: Negative for fever, chills, appetite change and fatigue.  HENT: Negative for congestion, dental problem, ear pain, hearing loss, sore throat, tinnitus, trouble swallowing and voice change.   Eyes: Negative for pain, discharge and visual disturbance.  Respiratory: Negative for cough, chest tightness, wheezing and stridor.   Cardiovascular: Negative for chest pain, palpitations and leg swelling.  Gastrointestinal: Negative for nausea, vomiting, abdominal pain, diarrhea, constipation, blood in stool and abdominal distention.  Genitourinary: Negative for urgency, hematuria, flank pain, discharge, difficulty urinating and genital sores.  Musculoskeletal: Negative for myalgias, back pain, joint swelling, arthralgias, gait problem and neck stiffness.  Skin: Negative for rash.  Neurological: Negative for dizziness, syncope, speech difficulty, weakness, numbness and headaches.  Hematological: Negative for adenopathy. Does not bruise/bleed easily.  Psychiatric/Behavioral: Negative for behavioral problems and dysphoric mood. The patient is not nervous/anxious.        Objective:   Physical Exam  Constitutional: He appears well-developed and well-nourished.  HENT:  Head: Normocephalic and atraumatic.  Right Ear: External ear normal.  Left Ear: External ear normal.  Nose: Nose normal.  Mouth/Throat: Oropharynx is clear and moist.  Eyes: Conjunctivae and EOM are normal. Pupils are equal, round, and  reactive to light. No scleral icterus.  Neck: Normal range of motion. Neck supple. No JVD present. No thyromegaly present.  Cardiovascular: Regular rhythm, normal heart sounds and intact distal pulses.  Exam reveals no gallop and no friction rub.   No murmur heard. Posterior tibia pulses full Dorsalis pedis pulses faint  Pulmonary/Chest: Effort normal and breath sounds normal. He exhibits no tenderness.  Abdominal: Soft. Bowel sounds are normal. He exhibits no distension and no mass. There is no tenderness.  Healing mid abdominal  scar  Genitourinary: Penis normal.  Musculoskeletal: Normal range of motion. He exhibits no edema or tenderness.  Lymphadenopathy:    He has no cervical adenopathy.  Neurological: He is alert. He has normal reflexes. No cranial nerve deficit. Coordination normal.  Skin: Skin is warm and dry. No rash noted.  Psychiatric: He has a normal mood and affect. His behavior is normal.          Assessment & Plan:   Preventive health examination Hypertension stable Status post surgery for ruptured appendicitis and subsequent incisional hernia repair BHD syndrome.  Recent inpatient CT abdominal scan.  Consider ultrasound in 12 -24 months

## 2014-09-17 NOTE — Patient Instructions (Addendum)
Limit your sodium (Salt) intake  Please check your blood pressure on a regular basis.  If it is consistently greater than 150/90, please make an office appointment.    It is important that you exercise regularly, at least 20 minutes 3 to 4 times per week.  If you develop chest pain or shortness of breath seek  medical attention.  Colonoscopy in one year  Health Maintenance A healthy lifestyle and preventative care can promote health and wellness.  Maintain regular health, dental, and eye exams.  Eat a healthy diet. Foods like vegetables, fruits, whole grains, low-fat dairy products, and lean protein foods contain the nutrients you need and are low in calories. Decrease your intake of foods high in solid fats, added sugars, and salt. Get information about a proper diet from your health care provider, if necessary.  Regular physical exercise is one of the most important things you can do for your health. Most adults should get at least 150 minutes of moderate-intensity exercise (any activity that increases your heart rate and causes you to sweat) each week. In addition, most adults need muscle-strengthening exercises on 2 or more days a week.   Maintain a healthy weight. The body mass index (BMI) is a screening tool to identify possible weight problems. It provides an estimate of body fat based on height and weight. Your health care provider can find your BMI and can help you achieve or maintain a healthy weight. For males 20 years and older:  A BMI below 18.5 is considered underweight.  A BMI of 18.5 to 24.9 is normal.  A BMI of 25 to 29.9 is considered overweight.  A BMI of 30 and above is considered obese.  Maintain normal blood lipids and cholesterol by exercising and minimizing your intake of saturated fat. Eat a balanced diet with plenty of fruits and vegetables. Blood tests for lipids and cholesterol should begin at age 44 and be repeated every 5 years. If your lipid or cholesterol  levels are high, you are over age 14, or you are at high risk for heart disease, you may need your cholesterol levels checked more frequently.Ongoing high lipid and cholesterol levels should be treated with medicines if diet and exercise are not working.  If you smoke, find out from your health care provider how to quit. If you do not use tobacco, do not start.  Lung cancer screening is recommended for adults aged 57-80 years who are at high risk for developing lung cancer because of a history of smoking. A yearly low-dose CT scan of the lungs is recommended for people who have at least a 30-pack-year history of smoking and are current smokers or have quit within the past 15 years. A pack year of smoking is smoking an average of 1 pack of cigarettes a day for 1 year (for example, a 30-pack-year history of smoking could mean smoking 1 pack a day for 30 years or 2 packs a day for 15 years). Yearly screening should continue until the smoker has stopped smoking for at least 15 years. Yearly screening should be stopped for people who develop a health problem that would prevent them from having lung cancer treatment.  If you choose to drink alcohol, do not have more than 2 drinks per day. One drink is considered to be 12 oz (360 mL) of beer, 5 oz (150 mL) of wine, or 1.5 oz (45 mL) of liquor.  Avoid the use of street drugs. Do not share needles with anyone.  Ask for help if you need support or instructions about stopping the use of drugs.  High blood pressure causes heart disease and increases the risk of stroke. Blood pressure should be checked at least every 1-2 years. Ongoing high blood pressure should be treated with medicines if weight loss and exercise are not effective.  If you are 43-61 years old, ask your health care provider if you should take aspirin to prevent heart disease.  Diabetes screening involves taking a blood sample to check your fasting blood sugar level. This should be done once every  3 years after age 96 if you are at a normal weight and without risk factors for diabetes. Testing should be considered at a younger age or be carried out more frequently if you are overweight and have at least 1 risk factor for diabetes.  Colorectal cancer can be detected and often prevented. Most routine colorectal cancer screening begins at the age of 55 and continues through age 31. However, your health care provider may recommend screening at an earlier age if you have risk factors for colon cancer. On a yearly basis, your health care provider may provide home test kits to check for hidden blood in the stool. A small camera at the end of a tube may be used to directly examine the colon (sigmoidoscopy or colonoscopy) to detect the earliest forms of colorectal cancer. Talk to your health care provider about this at age 72 when routine screening begins. A direct exam of the colon should be repeated every 5-10 years through age 34, unless early forms of precancerous polyps or small growths are found.  People who are at an increased risk for hepatitis B should be screened for this virus. You are considered at high risk for hepatitis B if:  You were born in a country where hepatitis B occurs often. Talk with your health care provider about which countries are considered high risk.  Your parents were born in a high-risk country and you have not received a shot to protect against hepatitis B (hepatitis B vaccine).  You have HIV or AIDS.  You use needles to inject street drugs.  You live with, or have sex with, someone who has hepatitis B.  You are a man who has sex with other men (MSM).  You get hemodialysis treatment.  You take certain medicines for conditions like cancer, organ transplantation, and autoimmune conditions.  Hepatitis C blood testing is recommended for all people born from [birth year] through 1966 and any individual with known risk factors for hepatitis C.  Healthy men should no longer  receive prostate-specific antigen (PSA) blood tests as part of routine cancer screening. Talk to your health care provider about prostate cancer screening.  Testicular cancer screening is not recommended for adolescents or adult males who have no symptoms. Screening includes self-exam, a health care provider exam, and other screening tests. Consult with your health care provider about any symptoms you have or any concerns you have about testicular cancer.  Practice safe sex. Use condoms and avoid high-risk sexual practices to reduce the spread of sexually transmitted infections (STIs).  You should be screened for STIs, including gonorrhea and chlamydia if:  You are sexually active and are younger than 24 years.  You are older than 24 years, and your health care provider tells you that you are at risk for this type of infection.  Your sexual activity has changed since you were last screened, and you are at an increased risk for chlamydia  or gonorrhea. Ask your health care provider if you are at risk.  If you are at risk of being infected with HIV, it is recommended that you take a prescription medicine daily to prevent HIV infection. This is called pre-exposure prophylaxis (PrEP). You are considered at risk if:  You are a man who has sex with other men (MSM).  You are a heterosexual man who is sexually active with multiple partners.  You take drugs by injection.  You are sexually active with a partner who has HIV.  Talk with your health care provider about whether you are at high risk of being infected with HIV. If you choose to begin PrEP, you should first be tested for HIV. You should then be tested every 3 months for as long as you are taking PrEP.  Use sunscreen. Apply sunscreen liberally and repeatedly throughout the day. You should seek shade when your shadow is shorter than you. Protect yourself by wearing long sleeves, pants, a wide-brimmed hat, and sunglasses year round whenever you  are outdoors.  Tell your health care provider of new moles or changes in moles, especially if there is a change in shape or color. Also, tell your health care provider if a mole is larger than the size of a pencil eraser.  A one-time screening for abdominal aortic aneurysm (AAA) and surgical repair of large AAAs by ultrasound is recommended for men aged 44-75 years who are current or former smokers.  Stay current with your vaccines (immunizations). Document Released: 08/05/2007 Document Revised: 02/11/2013 Document Reviewed: 07/04/2010 [facility] Patient Information 2015 [place], [place]. This information is not intended to replace advice given to you by your health care provider. Make sure you discuss any questions you have with your health care provider.

## 2014-09-17 NOTE — Progress Notes (Signed)
Pre visit review using our clinic review tool, if applicable. No additional management support is needed unless otherwise documented below in the visit note. 

## 2014-09-29 ENCOUNTER — Other Ambulatory Visit: Payer: Self-pay | Admitting: Family Medicine

## 2014-09-29 ENCOUNTER — Other Ambulatory Visit: Payer: Self-pay | Admitting: Internal Medicine

## 2014-09-29 MED ORDER — AMLODIPINE BESYLATE 10 MG PO TABS
10.0000 mg | ORAL_TABLET | Freq: Every day | ORAL | Status: DC
Start: 2014-09-29 — End: 2014-09-30

## 2014-09-29 MED ORDER — LISINOPRIL 40 MG PO TABS: 40.0000 mg | ORAL_TABLET | Freq: Every day | ORAL | Status: DC

## 2014-09-29 NOTE — Telephone Encounter (Signed)
Pt had cpe last week, however needs his rx filled  amLODipine (NORVASC) 10 MG tablet lisinopril (PRINIVIL,ZESTRIL) 40 MG tablet  Nicut outpt pharm

## 2014-09-29 NOTE — Telephone Encounter (Signed)
Sent to the pharmacy by e-scribe. 

## 2014-09-30 MED ORDER — LISINOPRIL 40 MG PO TABS: 40.0000 mg | ORAL_TABLET | Freq: Every day | ORAL | Status: DC

## 2014-09-30 MED ORDER — AMLODIPINE BESYLATE 10 MG PO TABS: 10.0000 mg | ORAL_TABLET | Freq: Every day | ORAL | Status: DC

## 2014-09-30 NOTE — Telephone Encounter (Signed)
Refills sent

## 2014-12-12 IMAGING — CR DG CHEST 2V
2 series · 2 of 2 positions shown · non-contrast
Comparison: April 13, 2008

CLINICAL DATA: Chest pain and shortness of breath

CHEST - 2 VIEW

[w chest pa]
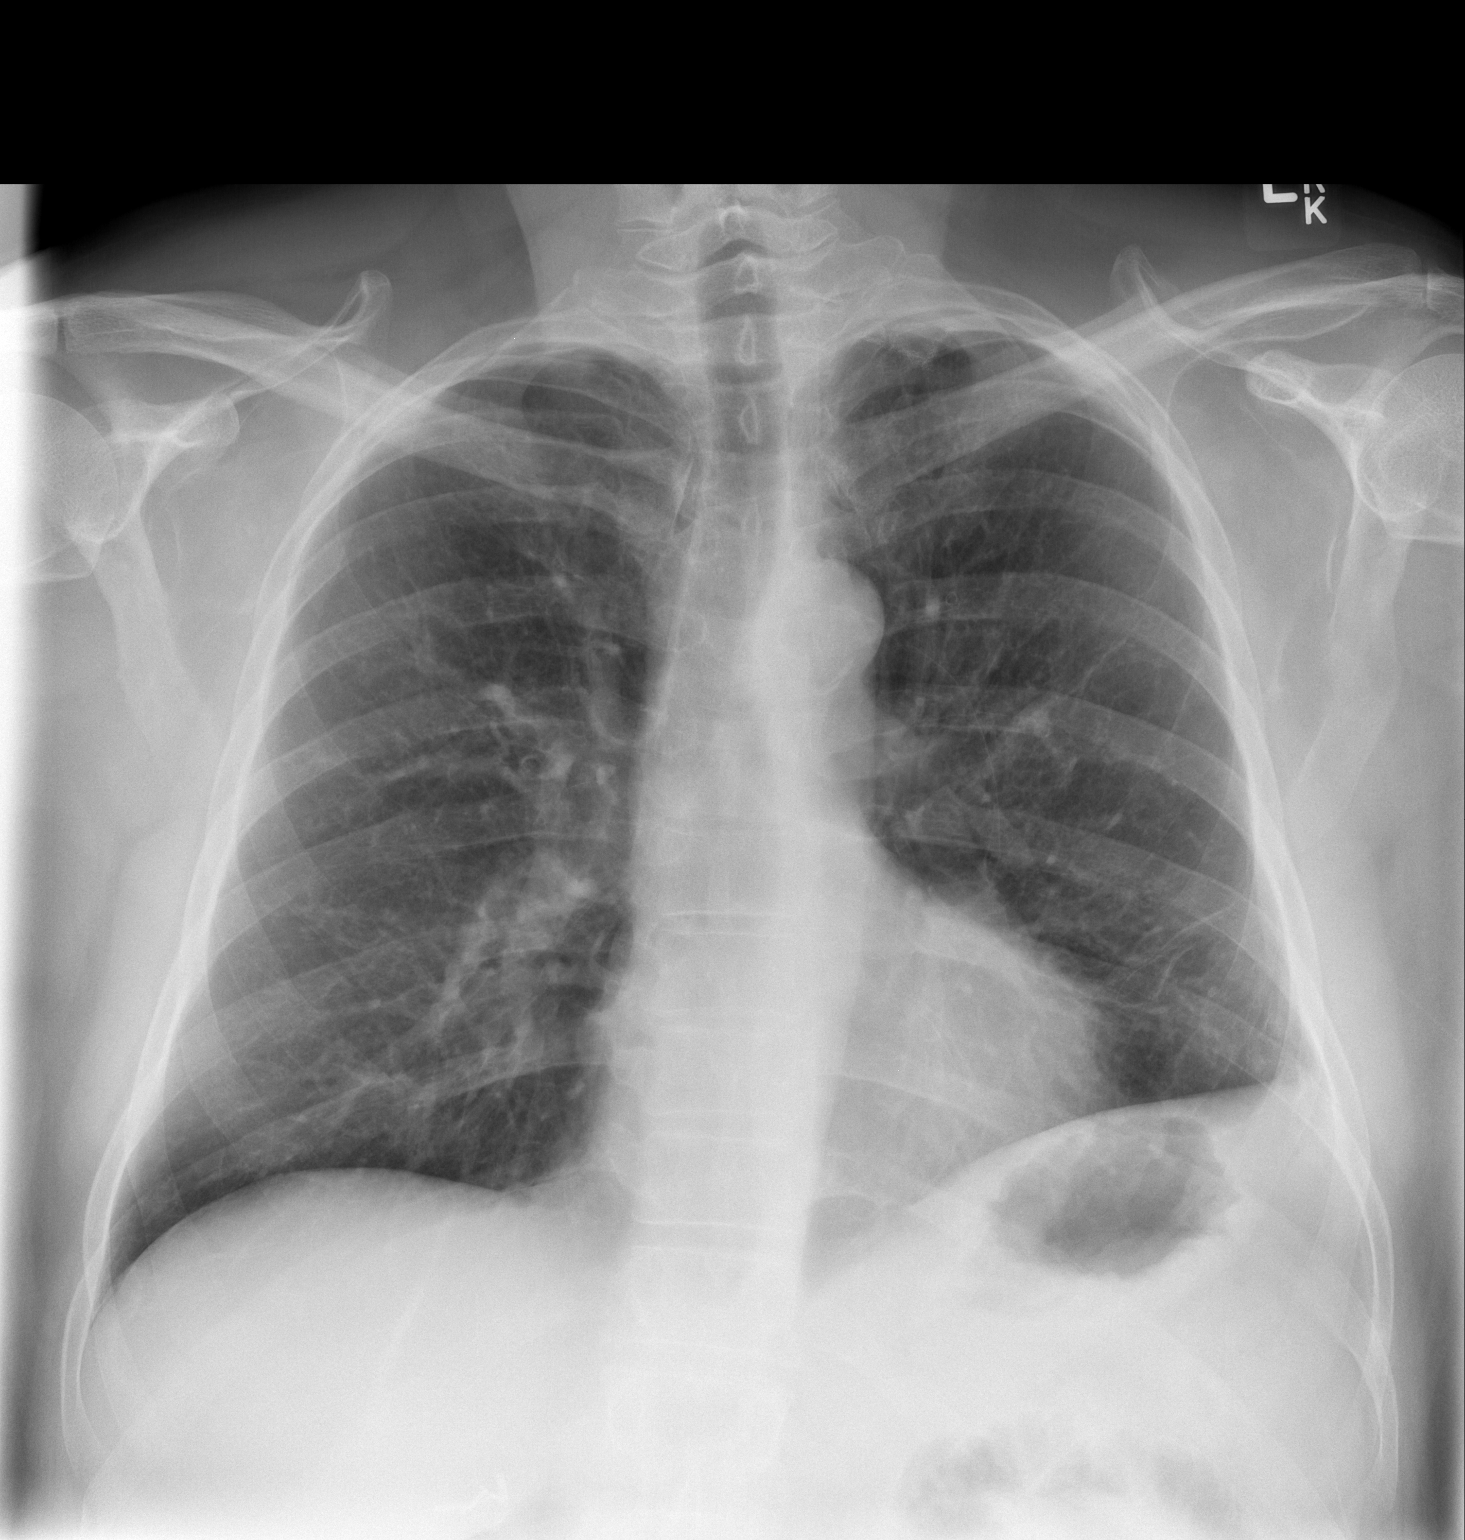

[w chest lat]
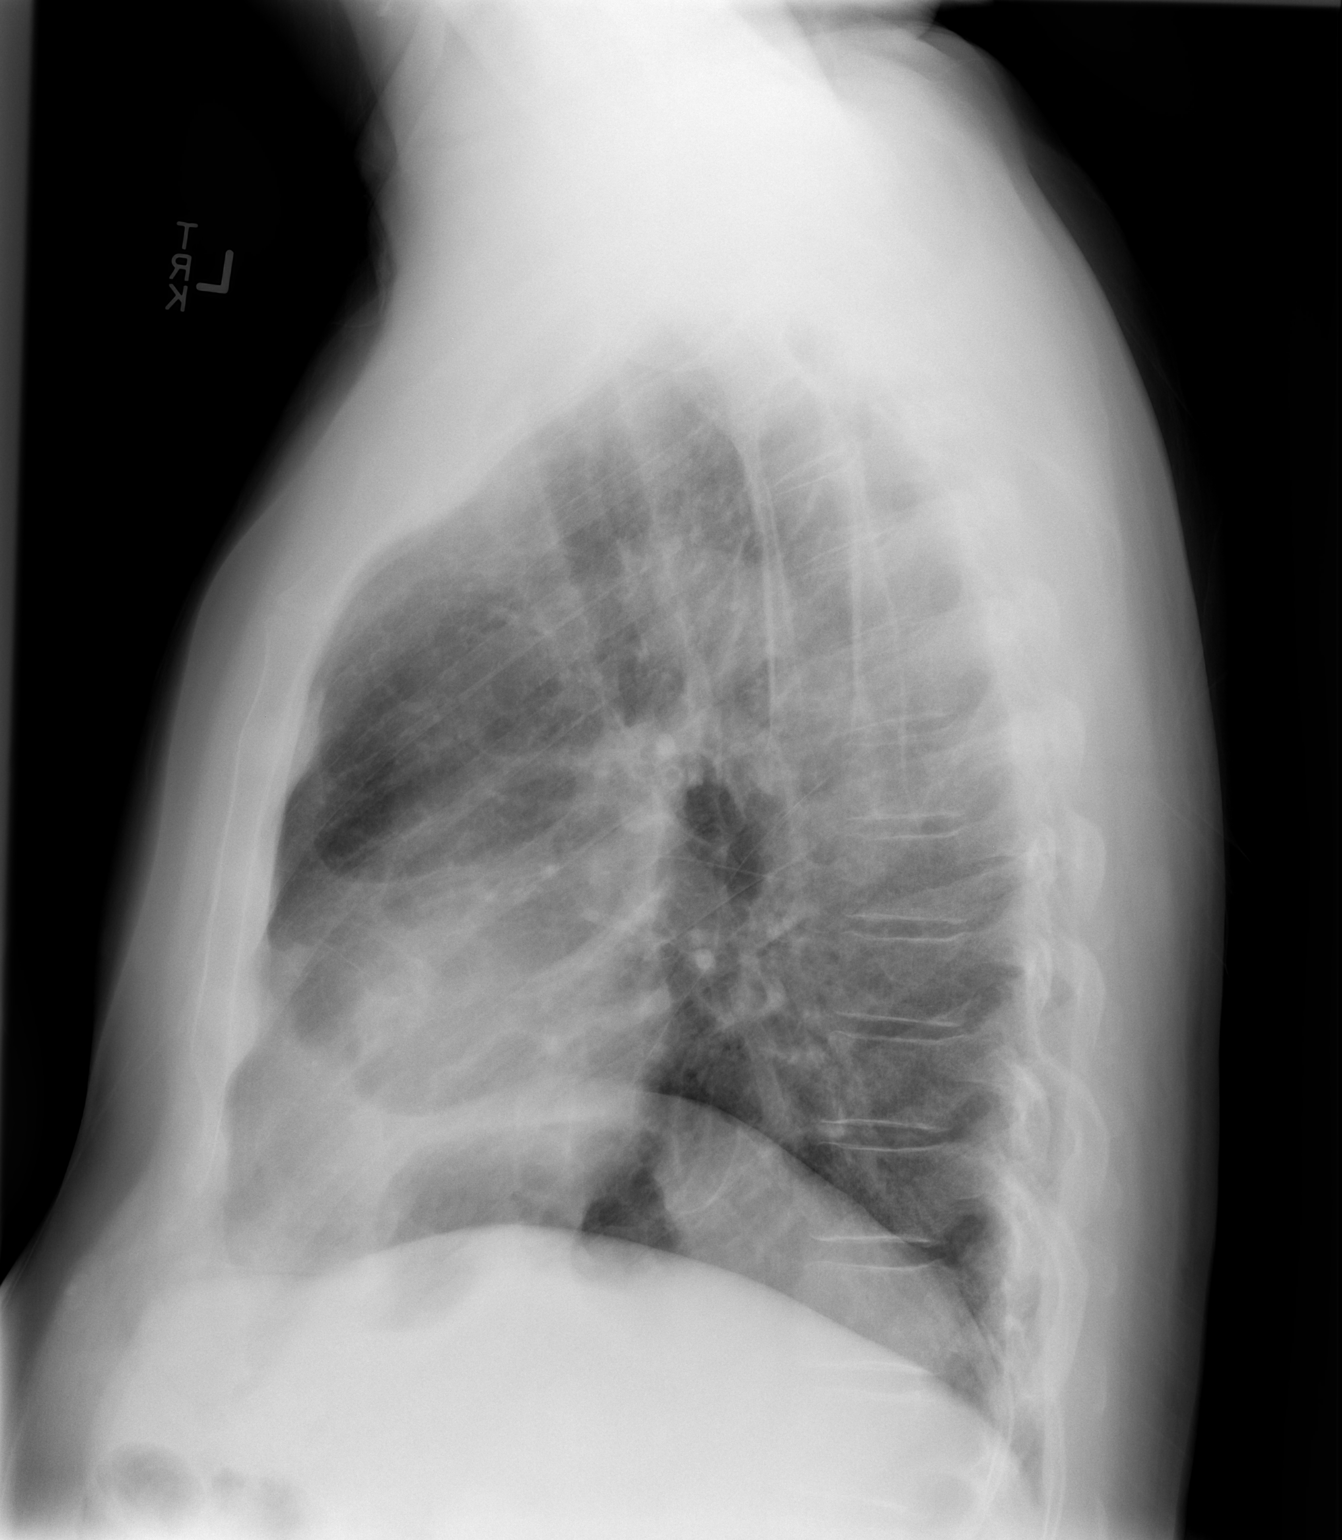

[2 of 2 positions shown; findings below may reference images not displayed]

FINDINGS: There is scarring in the left base.  There is blunting of
the left costophrenic angle which is stable.

There is no edema or consolidation.  The heart size and pulmonary
vascularity are normal.  No adenopathy.  No pneumothorax.  No bone
lesions.  There is calcification in both carotid arteries.
IMPRESSION: Left base scarring.  No edema or dilatation. Bilateral
carotid artery   calcification is present.

## 2015-02-08 ENCOUNTER — Encounter: Payer: Self-pay | Admitting: Family Medicine

## 2015-02-08 ENCOUNTER — Ambulatory Visit (INDEPENDENT_AMBULATORY_CARE_PROVIDER_SITE_OTHER): Payer: 59 | Admitting: Family Medicine

## 2015-02-08 VITALS — BP 144/88 | HR 62 | Temp 98.5°F | Wt 215.0 lb

## 2015-02-08 DIAGNOSIS — H9203 Otalgia, bilateral: Secondary | ICD-10-CM

## 2015-02-08 NOTE — Progress Notes (Signed)
Ernest Reddish, MD  Subjective:  Ernest Hudson is a 59 y.o. year old very pleasant male patient who presents for/with See problem oriented charting ROS- no hearing loss from baseline or tinnitus. No headaches or blurry vision. No fever or chills.   Past Medical History-  Birt-hogg-dube syndrome. Tobacco abuse. Hyperlipidemia. Hypertension.   Medications- reviewed and updated Current Outpatient Prescriptions  Medication Sig Dispense Refill  . amLODipine (NORVASC) 10 MG tablet Take 1 tablet (10 mg total) by mouth daily. 30 tablet 10  . aspirin EC 81 MG tablet Take 81 mg by mouth every morning.     . cetirizine (ZYRTEC) 10 MG tablet Take 10 mg by mouth every morning.     . fluticasone (FLONASE) 50 MCG/ACT nasal spray Place 2 sprays into the nose daily as needed. For allergies. 16 g 6  . lisinopril (PRINIVIL,ZESTRIL) 40 MG tablet Take 1 tablet (40 mg total) by mouth daily. 30 tablet 10  . magnesium oxide (MAG-OX) 400 MG tablet Take 400 mg by mouth every morning.     Marland Kitchen ibuprofen (ADVIL,MOTRIN) 200 MG tablet You can take 2-3 of these every 6 hours as needed. (Patient not taking: Reported on 02/08/2015) 30 tablet 0   Objective: BP 144/88 mmHg  Pulse 62  Temp(Src) 98.5 F (36.9 C)  Wt 215 lb (97.523 kg) Gen: NAD, resting comfortably ENT: light amount of wax removed with curette in left ear, bilateral ear canals otherwise normal. Bilateral tympanic membranes normal. No pain with compression of pinna or palpation over mastoid. No sinus tenderness. ororpharynx normal. Palpated TMJ and with jaw motion no pain or clicking is produced.  CV: RRR no murmurs rubs or gallops Lungs: CTAB no crackles, wheeze, rhonchi Ext: no edema Skin: warm, dry, no rash over face on ears. Baseline lesions on ears noted  Assessment/Plan:  Ear pain, bilateral - Plan: Ambulatory referral to ENT S:Pain for a month. Mild aching all day long but at times moderate aching pain. Higher level can last minutes to hours- has  had to take ibuprofen for this. [facility] given amoxicillin for otitis media with no improvement in symptoms. 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 For allergies, Zyrtec year round along with flonase. Denies sinus tenderness. No URI symptoms. Does get some clicking and popping in the ears as well A/P: 59 year old male with infrequent complaints of pain presenting with bilateral ear pain over a month. Does have birt-hogg-dube syndrome but I do not see any links to ENT system in brief review of disorder. No signs of infection or TMJ. No obvious cause on exam. Given over a month of symptoms and no apparent cause- will get ENT opinion at this point. ? If may necessitate imaging.   Orders Placed This Encounter  Procedures  . Ambulatory referral to ENT    Referral Priority:  Routine    Referral Type:  Consultation    Referral Reason:  Specialty Services Required    Requested Specialty:  Otolaryngology    Number of Visits Requested:  1

## 2015-02-08 NOTE — Patient Instructions (Signed)
We will call you within a week about your referral to ear, nose, and throat doctors given ear pain for over a month without obvious cause on exam and history. If you do not hear within 2 weeks, give [place] a call.

## 2015-03-03 ENCOUNTER — Encounter: Payer: Self-pay | Admitting: Family Medicine

## 2015-03-03 ENCOUNTER — Ambulatory Visit (INDEPENDENT_AMBULATORY_CARE_PROVIDER_SITE_OTHER): Payer: 59 | Admitting: Family Medicine

## 2015-03-03 VITALS — BP 152/88 | HR 76 | Temp 98.2°F | Ht 69.75 in | Wt 213.0 lb

## 2015-03-03 DIAGNOSIS — J019 Acute sinusitis, unspecified: Secondary | ICD-10-CM | POA: Diagnosis not present

## 2015-03-03 MED ORDER — METHYLPREDNISOLONE ACETATE 80 MG/ML IJ SUSP
120.0000 mg | Freq: Once | INTRAMUSCULAR | Status: AC
  Administered 2015-03-03: 120 mg via INTRAMUSCULAR

## 2015-03-03 MED ORDER — AZITHROMYCIN 250 MG PO TABS: ORAL_TABLET | ORAL | Status: DC

## 2015-03-03 MED FILL — AZITHROMYCIN 250 MG TABLET: 250 | 5 days supply | Qty: 6 | Fill #0

## 2015-03-03 NOTE — Progress Notes (Signed)
Pre visit review using our clinic review tool, if applicable. No additional management support is needed unless otherwise documented below in the visit note. 

## 2015-03-03 NOTE — Addendum Note (Signed)
Addended by: Aggie Hacker A on: 03/03/2015 04:31 PM   Modules accepted: Orders

## 2015-03-03 NOTE — Progress Notes (Signed)
   Subjective:    Patient ID: Ernest Hudson, male    DOB: Sep 17, 1955, 60 y.o.   MRN: [id number]  HPI Here for 3 days of pressure and pain in the sinuses of the left side of the face. He is blowing green mucus out of the nose and has PND. No fever or cough. On Mucinex.    Review of Systems  Constitutional: Negative.   HENT: Positive for congestion, ear pain, postnasal drip and sinus pressure. Negative for sore throat.   Eyes: Negative.   Respiratory: Negative.        Objective:   Physical Exam  Constitutional: He appears well-developed and well-nourished.  HENT:  Right Ear: External ear normal.  Left Ear: External ear normal.  Nose: Nose normal.  Mouth/Throat: Oropharynx is clear and moist.  Eyes: Conjunctivae are normal.  Neck: No thyromegaly present.  Pulmonary/Chest: Effort normal and breath sounds normal.  Lymphadenopathy:    He has no cervical adenopathy.          Assessment & Plan:  Sinusitis, treat with a steroid shot and a Zpack.

## 2015-04-25 MED FILL — LISINOPRIL 40 MG TABLET: 40 | 90 days supply | Qty: 90 | Fill #2

## 2015-04-26 MED FILL — AMLODIPINE BESYLATE 10 MG T: 10 | 90 days supply | Qty: 90 | Fill #2

## 2015-05-24 ENCOUNTER — Encounter: Payer: Self-pay | Admitting: *Deleted

## 2015-06-24 ENCOUNTER — Encounter: Payer: Self-pay | Admitting: Internal Medicine

## 2015-08-09 DIAGNOSIS — D1800 Hemangioma unspecified site: Secondary | ICD-10-CM | POA: Diagnosis not present

## 2015-08-09 DIAGNOSIS — C801 Malignant (primary) neoplasm, unspecified: Secondary | ICD-10-CM | POA: Diagnosis not present

## 2015-08-09 DIAGNOSIS — W57XXXA Bitten or stung by nonvenomous insect and other nonvenomous arthropods, initial encounter: Secondary | ICD-10-CM | POA: Diagnosis not present

## 2015-08-09 DIAGNOSIS — C44319 Basal cell carcinoma of skin of other parts of face: Secondary | ICD-10-CM | POA: Diagnosis not present

## 2015-08-09 DIAGNOSIS — D485 Neoplasm of uncertain behavior of skin: Secondary | ICD-10-CM | POA: Diagnosis not present

## 2015-08-11 MED FILL — AMLODIPINE BESYLATE 10 MG T: 10 | 60 days supply | Qty: 60 | Fill #3

## 2015-08-11 MED FILL — LISINOPRIL 40 MG TABLET: 40 | 60 days supply | Qty: 60 | Fill #3

## 2015-09-13 ENCOUNTER — Other Ambulatory Visit (INDEPENDENT_AMBULATORY_CARE_PROVIDER_SITE_OTHER): Payer: 59

## 2015-09-13 DIAGNOSIS — Z0000 Encounter for general adult medical examination without abnormal findings: Secondary | ICD-10-CM | POA: Diagnosis not present

## 2015-09-13 LAB — CBC WITH DIFFERENTIAL/PLATELET
BASOS PCT: 0.3 % (ref 0.0–3.0)
Basophils Absolute: 0 10*3/uL (ref 0.0–0.1)
EOS PCT: 5.2 % — AB (ref 0.0–5.0)
Eosinophils Absolute: 0.4 10*3/uL (ref 0.0–0.7)
HCT: 47.1 % (ref 39.0–52.0)
Hemoglobin: 16.1 g/dL (ref 13.0–17.0)
LYMPHS ABS: 2.2 10*3/uL (ref 0.7–4.0)
Lymphocytes Relative: 30.3 % (ref 12.0–46.0)
MCHC: 34.2 g/dL (ref 30.0–36.0)
MCV: 88.2 fl (ref 78.0–100.0)
MONO ABS: 0.4 10*3/uL (ref 0.1–1.0)
Monocytes Relative: 5.7 % (ref 3.0–12.0)
NEUTROS PCT: 58.5 % (ref 43.0–77.0)
Neutro Abs: 4.3 10*3/uL (ref 1.4–7.7)
PLATELETS: 262 10*3/uL (ref 150.0–400.0)
RBC: 5.34 Mil/uL (ref 4.22–5.81)
RDW: 13.7 % (ref 11.5–15.5)
WBC: 7.3 10*3/uL (ref 4.0–10.5)

## 2015-09-13 LAB — POC URINALSYSI DIPSTICK (AUTOMATED)
BILIRUBIN UA: NEGATIVE
Blood, UA: NEGATIVE
Glucose, UA: NEGATIVE
KETONES UA: NEGATIVE
LEUKOCYTES UA: NEGATIVE
Nitrite, UA: NEGATIVE
Protein, UA: NEGATIVE
Spec Grav, UA: 1.025
Urobilinogen, UA: 0.2
pH, UA: 5

## 2015-09-13 LAB — HEPATIC FUNCTION PANEL
ALT: 24 U/L (ref 0–53)
AST: 16 U/L (ref 0–37)
Albumin: 4.5 g/dL (ref 3.5–5.2)
Alkaline Phosphatase: 78 U/L (ref 39–117)
BILIRUBIN TOTAL: 0.5 mg/dL (ref 0.2–1.2)
Bilirubin, Direct: 0.1 mg/dL (ref 0.0–0.3)
Total Protein: 6.9 g/dL (ref 6.0–8.3)

## 2015-09-13 LAB — LIPID PANEL
CHOLESTEROL: 159 mg/dL (ref 0–200)
HDL: 37.6 mg/dL — AB (ref >39.00)
LDL CALC: 98 mg/dL (ref 0–99)
NonHDL: 121.36
TRIGLYCERIDES: 115 mg/dL (ref 0.0–149.0)
Total CHOL/HDL Ratio: 4
VLDL: 23 mg/dL (ref 0.0–40.0)

## 2015-09-13 LAB — BASIC METABOLIC PANEL
BUN: 15 mg/dL (ref 6–23)
CO2: 31 meq/L (ref 19–32)
CREATININE: 1.06 mg/dL (ref 0.40–1.50)
Calcium: 9.8 mg/dL (ref 8.4–10.5)
Chloride: 104 mEq/L (ref 96–112)
GFR: 75.46 mL/min (ref >60.00)
Glucose, Bld: 98 mg/dL (ref 70–99)
Potassium: 4.6 mEq/L (ref 3.5–5.1)
SODIUM: 143 meq/L (ref 135–145)

## 2015-09-13 LAB — TSH: TSH: 2.83 u[IU]/mL (ref 0.35–4.50)

## 2015-09-20 ENCOUNTER — Encounter: Payer: Self-pay | Admitting: Internal Medicine

## 2015-09-20 ENCOUNTER — Ambulatory Visit (INDEPENDENT_AMBULATORY_CARE_PROVIDER_SITE_OTHER): Payer: 59 | Admitting: Internal Medicine

## 2015-09-20 VITALS — BP 148/98 | HR 56 | Temp 97.9°F | Ht 70.0 in | Wt 209.4 lb

## 2015-09-20 DIAGNOSIS — Q898 Other specified congenital malformations: Secondary | ICD-10-CM

## 2015-09-20 DIAGNOSIS — Z0000 Encounter for general adult medical examination without abnormal findings: Secondary | ICD-10-CM

## 2015-09-20 DIAGNOSIS — Q8789 Other specified congenital malformation syndromes, not elsewhere classified: Secondary | ICD-10-CM

## 2015-09-20 NOTE — Progress Notes (Signed)
Subjective:    Patient ID: Ernest Hudson, male    DOB: February 22, 1955, 60 y.o.   MRN: [id number]  HPI 60  -year-old patient who is seen today for a health maintenance exam.  He has a history of hypertension, allergic rhinitis, and does quite well. He is a former smoker and discontinued tobacco products in June of 2014 He has had a history of a ruptured appendicitis and was hospitalized spring 2016 for hernia repair.  This was complicated by postop ileus   Past Medical History:  Diagnosis Date  . 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 Allergic rhinitis   . Anginal pain ([place])   . Arthritis    "probably in my fingers" (04/08/2013)  . Basal cell carcinoma 2000's   'chest"  . Birt-Hogg-Dube syndrome   . Bradycardia   . Dyslipidemia (high LDL; low HDL)   . 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 History of blood transfusion ?1995  . Hx of colonic polyps   . Hyperlipidemia   . Hypertension   . Ileus, postoperative 04/17/2013  . Spontaneous pneumothorax 1995    Social History   Social History  . Marital status: Married    Spouse name: N/A  . Number of children: N/A  . Years of education: N/A   Occupational History  . Not on file.   Social History Main Topics  . Smoking status: Former Smoker    Packs/day: 2.00    Years: 30.00    Types: Cigarettes    Quit date: 07/30/2012  . Smokeless tobacco: Never Used  . Alcohol use No  . Drug use: No  . Sexual activity: Not Currently   Other Topics Concern  . Not on file   Social History Narrative  . No narrative on file    Past Surgical History:  Procedure Laterality Date  . APPENDECTOMY  04/07/2013  . APPENDECTOMY  04/07/2013   Procedure: OPEN APPENDECTOMY;  Surgeon: Gayland Curry, MD;  Location: [facility];  Service: General;;  . cardiolyte stress  04/2008  . CHOLECYSTECTOMY  2004  . COLONOSCOPY  06/2005  . ELBOW SURGERY Left 2001   "tendonitis"  . EXTERNAL EAR SURGERY  1971  . INCISIONAL HERNIA REPAIR  N/A 05/12/2014   Procedure: OPEN REPAIR OF INCISIONAL HERNIA REPAIR;  Surgeon: Greer Pickerel, MD;  Location: [facility];  Service: General;  Laterality: N/A;  . INSERTION OF MESH N/A 05/12/2014   Procedure: WITH INSERTION OF MESH;  Surgeon: Greer Pickerel, MD;  Location: [facility];  Service: General;  Laterality: N/A;  . KNEE ARTHROSCOPY Left 1990's  . LAPAROSCOPIC APPENDECTOMY N/A 04/07/2013   Procedure: ATTEMPTED APPENDECTOMY LAPAROSCOPIC;  Surgeon: Gayland Curry, MD;  Location: [facility];  Service: General;  Laterality: N/A;  . LUNG SURGERY     "repaired ruptured bleb" '95  . MIDDLE EAR SURGERY Left 1972   exploratory tympanotomy/notes 07/17/2000  . NASAL SEPTUM SURGERY    . PARTIAL COLECTOMY  04/07/2013   Procedure: PARTIAL COLECTOMY, ILEOCECTOMY;  Surgeon: Gayland Curry, MD;  Location: [facility];  Service: General;;  . SKIN CANCER EXCISION  2000's   "chest"  . TENDON REPAIR Left 1990's   "thumb"    Family History  Problem Relation Age of Onset  . Breast cancer Mother   . Diabetes Mother   . Heart attack Father   . Diabetes Father   . Prostate cancer Father   . Healthy Sister   .  Lung disease Sister     has blebs on MRI,   . Healthy Brother   . Healthy Brother   . Breast cancer Maternal Aunt     diagnosed in her 61s  . Melanoma Maternal Uncle   . Breast cancer Maternal Aunt     diagnosed in her 73s  . Breast cancer Maternal Aunt     diagnosed in her 78s  . Cancer Cousin     multiple myeloma    Allergies  Allergen Reactions  . Niacin     REACTION: rash    Current Outpatient Prescriptions on File Prior to Visit  Medication Sig Dispense Refill  . amLODipine (NORVASC) 10 MG tablet Take 1 tablet (10 mg total) by mouth daily. 30 tablet 10  . aspirin EC 81 MG tablet Take 81 mg by mouth every morning.     . cetirizine (ZYRTEC) 10 MG tablet Take 10 mg by mouth every morning.     . fluticasone (FLONASE) 50 MCG/ACT nasal spray Place 2 sprays into the nose daily as needed. For allergies. 16 g 6    . ibuprofen (ADVIL,MOTRIN) 200 MG tablet You can take 2-3 of these every 6 hours as needed. 30 tablet 0  . lisinopril (PRINIVIL,ZESTRIL) 40 MG tablet Take 1 tablet (40 mg total) by mouth daily. 30 tablet 10  . Multiple Vitamin (MULTIVITAMIN) tablet Take 1 tablet by mouth daily.    . magnesium oxide (MAG-OX) 400 MG tablet Take 400 mg by mouth every morning.      No current facility-administered medications on file prior to visit.     There were no vitals taken for this visit.     Review of Systems  Constitutional: Negative for appetite change, chills, fatigue and fever.  HENT: Negative for congestion, dental problem, ear pain, hearing loss, sore throat, tinnitus, trouble swallowing and voice change.   Eyes: Negative for pain, discharge and visual disturbance.  Respiratory: Negative for cough, chest tightness, wheezing and stridor.   Cardiovascular: Negative for chest pain, palpitations and leg swelling.  Gastrointestinal: Negative for abdominal distention, abdominal pain, blood in stool, constipation, diarrhea, nausea and vomiting.  Genitourinary: Negative for difficulty urinating, discharge, flank pain, genital sores, hematuria and urgency.  Musculoskeletal: Negative for arthralgias, back pain, gait problem, joint swelling, myalgias and neck stiffness.  Skin: Negative for rash.  Neurological: Negative for dizziness, syncope, speech difficulty, weakness, numbness and headaches.  Hematological: Negative for adenopathy. Does not bruise/bleed easily.  Psychiatric/Behavioral: Negative for behavioral problems and dysphoric mood. The patient is not nervous/anxious.        Objective:   Physical Exam  Constitutional: He appears well-developed and well-nourished.  HENT:  Head: Normocephalic and atraumatic.  Right Ear: External ear normal.  Left Ear: External ear normal.  Nose: Nose normal.  Mouth/Throat: Oropharynx is clear and moist.  Eyes: Conjunctivae and EOM are normal. Pupils are  equal, round, and reactive to light. No scleral icterus.  Neck: Normal range of motion. Neck supple. No JVD present. No thyromegaly present.  Cardiovascular: Regular rhythm, normal heart sounds and intact distal pulses.  Exam reveals no gallop and no friction rub.   No murmur heard. Posterior tibia pulses full Dorsalis pedis pulses faint  Pulmonary/Chest: Effort normal and breath sounds normal. He exhibits no tenderness.  Abdominal: Soft. Bowel sounds are normal. He exhibits no distension and no mass. There is no tenderness.  Healing mid abdominal scar  Genitourinary: Penis normal.  Musculoskeletal: Normal range of motion. He exhibits no edema  or tenderness.  Lymphadenopathy:    He has no cervical adenopathy.  Neurological: He is alert. He has normal reflexes. No cranial nerve deficit. Coordination normal.  Skin: Skin is warm and dry. No rash noted.  Psychiatric: He has a normal mood and affect. His behavior is normal.          Assessment & Plan:   Preventive health examination Hypertension stable Status post surgery for ruptured appendicitis and subsequent incisional hernia repair BHD syndrome.  Recent inpatient CT abdominal scan.  Consider ultrasound in 12 -24 months  risk/, benefit of PSA screening.  Discussed.  His father has metastatic prostate cancer and a brother-in-law has also been diagnosed with prostate cancer  .  Follow colonoscopy scheduled  .  Continue home blood pressure monitoring and low-salt diet  .  Recheck 6-12 months  [name], MD

## 2015-09-20 NOTE — Patient Instructions (Signed)
Schedule your colonoscopy to help detect colon cancer.  Limit your sodium (Salt) intake  Please check your blood pressure on a regular basis.  If it is consistently greater than 150/90, please make an office appointment.    It is important that you exercise regularly, at least 20 minutes 3 to 4 times per week.  If you develop chest pain or shortness of breath seek  medical attention.

## 2015-09-20 NOTE — Progress Notes (Signed)
Pre visit review using our clinic review tool, if applicable. No additional management support is needed unless otherwise documented below in the visit note. 

## 2015-10-11 ENCOUNTER — Encounter: Payer: Self-pay | Admitting: Internal Medicine

## 2015-10-11 DIAGNOSIS — Z86018 Personal history of other benign neoplasm: Secondary | ICD-10-CM | POA: Diagnosis not present

## 2015-10-11 DIAGNOSIS — D225 Melanocytic nevi of trunk: Secondary | ICD-10-CM | POA: Diagnosis not present

## 2015-10-11 DIAGNOSIS — Q8789 Other specified congenital malformation syndromes, not elsewhere classified: Secondary | ICD-10-CM | POA: Diagnosis not present

## 2015-10-11 DIAGNOSIS — Z85828 Personal history of other malignant neoplasm of skin: Secondary | ICD-10-CM | POA: Diagnosis not present

## 2015-10-11 DIAGNOSIS — L723 Sebaceous cyst: Secondary | ICD-10-CM | POA: Diagnosis not present

## 2015-10-11 DIAGNOSIS — D485 Neoplasm of uncertain behavior of skin: Secondary | ICD-10-CM | POA: Diagnosis not present

## 2015-10-11 DIAGNOSIS — L821 Other seborrheic keratosis: Secondary | ICD-10-CM | POA: Diagnosis not present

## 2015-10-18 DIAGNOSIS — D485 Neoplasm of uncertain behavior of skin: Secondary | ICD-10-CM | POA: Diagnosis not present

## 2015-10-18 DIAGNOSIS — D235 Other benign neoplasm of skin of trunk: Secondary | ICD-10-CM | POA: Diagnosis not present

## 2015-10-18 DIAGNOSIS — L905 Scar conditions and fibrosis of skin: Secondary | ICD-10-CM | POA: Diagnosis not present

## 2015-10-18 DIAGNOSIS — C44319 Basal cell carcinoma of skin of other parts of face: Secondary | ICD-10-CM | POA: Diagnosis not present

## 2015-10-27 ENCOUNTER — Other Ambulatory Visit: Payer: Self-pay | Admitting: Internal Medicine

## 2015-10-27 MED FILL — LISINOPRIL 40 MG TABLET: 40 | 90 days supply | Qty: 90 | Fill #0

## 2015-10-27 MED FILL — AMLODIPINE BESYLATE 10 MG T: 10 | 90 days supply | Qty: 90 | Fill #0

## 2015-11-22 ENCOUNTER — Ambulatory Visit (AMBULATORY_SURGERY_CENTER): Payer: Self-pay

## 2015-11-22 VITALS — Ht 70.0 in | Wt 212.8 lb

## 2015-11-22 DIAGNOSIS — Z860100 Personal history of colon polyps, unspecified: Secondary | ICD-10-CM

## 2015-11-22 MED ORDER — SUPREP BOWEL PREP KIT 17.5-3.13-1.6 GM/177ML PO SOLN: 1.0000 | Freq: Once | ORAL | 0 refills | Status: AC

## 2015-11-22 MED FILL — SUPREP BOWEL PREP KIT: 17.5-3.13-1 | 1 days supply | Qty: 354 | Fill #0

## 2015-11-22 NOTE — Progress Notes (Signed)
No allergies to eggs or soy No diet meds No home oxygen No past problems with anesthesia  Declined emmi 

## 2015-12-06 ENCOUNTER — Encounter: Payer: 59 | Admitting: Internal Medicine

## 2015-12-13 ENCOUNTER — Encounter: Payer: Self-pay | Admitting: Internal Medicine

## 2015-12-13 ENCOUNTER — Ambulatory Visit (AMBULATORY_SURGERY_CENTER): Payer: 59 | Admitting: Internal Medicine

## 2015-12-13 VITALS — BP 121/78 | HR 52 | Temp 97.8°F | Resp 13 | Ht 70.0 in | Wt 212.0 lb

## 2015-12-13 DIAGNOSIS — Z1212 Encounter for screening for malignant neoplasm of rectum: Secondary | ICD-10-CM

## 2015-12-13 DIAGNOSIS — Z1211 Encounter for screening for malignant neoplasm of colon: Secondary | ICD-10-CM | POA: Diagnosis present

## 2015-12-13 DIAGNOSIS — K635 Polyp of colon: Secondary | ICD-10-CM

## 2015-12-13 DIAGNOSIS — D125 Benign neoplasm of sigmoid colon: Secondary | ICD-10-CM

## 2015-12-13 DIAGNOSIS — Z86010 Personal history of colonic polyps: Secondary | ICD-10-CM

## 2015-12-13 MED ORDER — SODIUM CHLORIDE 0.9 % IV SOLN: 500.0000 mL | INTRAVENOUS | Status: DC

## 2015-12-13 NOTE — Patient Instructions (Signed)
Impressions/Recommendations:  Polyps (handout given) Hemorrhoids (handout given)  Repeat colonoscopy pending pathology results.  YOU HAD AN ENDOSCOPIC PROCEDURE TODAY AT [facility]:   Refer to the procedure report that was given to you for any specific questions about what was found during the examination.  If the procedure report does not answer your questions, please call your gastroenterologist to clarify.  If you requested that your care partner not be given the details of your procedure findings, then the procedure report has been included in a sealed envelope for you to review at your convenience later.  YOU SHOULD EXPECT: Some feelings of bloating in the abdomen. Passage of more gas than usual.  Walking can help get rid of the air that was put into your GI tract during the procedure and reduce the bloating. If you had a lower endoscopy (such as a colonoscopy or flexible sigmoidoscopy) you may notice spotting of blood in your stool or on the toilet paper. If you underwent a bowel prep for your procedure, you may not have a normal bowel movement for a few days.  Please Note:  You might notice some irritation and congestion in your nose or some drainage.  This is from the oxygen used during your procedure.  There is no need for concern and it should clear up in a day or so.  SYMPTOMS TO REPORT IMMEDIATELY:   Following lower endoscopy (colonoscopy or flexible sigmoidoscopy):  Excessive amounts of blood in the stool  Significant tenderness or worsening of abdominal pains  Swelling of the abdomen that is new, acute  Fever of 100F or higher  For urgent or emergent issues, a gastroenterologist can be reached at any hour by calling [phone].   DIET:  We do recommend a small meal at first, but then you may proceed to your regular diet.  Drink plenty of fluids but you should avoid alcoholic beverages for 24 hours.  ACTIVITY:  You should plan to take it easy for the  rest of today and you should NOT DRIVE or use heavy machinery until tomorrow (because of the sedation medicines used during the test).    FOLLOW UP: Our staff will call the number listed on your records the next business day following your procedure to check on you and address any questions or concerns that you may have regarding the information given to you following your procedure. If we do not reach you, we will leave a message.  However, if you are feeling well and you are not experiencing any problems, there is no need to return our call.  We will assume that you have returned to your regular daily activities without incident.  If any biopsies were taken you will be contacted by phone or by letter within the next 1-3 weeks.  Please call us at [phone] if you have not heard about the biopsies in 3 weeks.    SIGNATURES/CONFIDENTIALITY: You and/or your care partner have signed paperwork which will be entered into your electronic medical record.  These signatures attest to the fact that that the information above on your After Visit Summary has been reviewed and is understood.  Full responsibility of the confidentiality of this discharge information lies with you and/or your care-partner.

## 2015-12-13 NOTE — Op Note (Signed)
[name] Procedure Date: 12/13/2015 7:57 AM MRN: [id number] Endoscopist: Jerene Bears , MD Age: 60 Referring MD:  Date of Birth: 03-16-1955 Gender: Male Account #: [id number] Procedure:                Colonoscopy Indications:              Screening for colorectal malignant neoplasm, Last                            colonoscopy 10 years ago Medicines:                Monitored Anesthesia Care Procedure:                Pre-Anesthesia Assessment:                           - Prior to the procedure, a History and Physical                            was performed, and patient medications and                            allergies were reviewed. The patient's tolerance of                            previous anesthesia was also reviewed. The risks                            and benefits of the procedure and the sedation                            options and risks were discussed with the patient.                            All questions were answered, and informed consent                            was obtained. Prior Anticoagulants: The patient has                            taken no previous anticoagulant or antiplatelet                            agents. ASA Grade Assessment: III - A patient with                            severe systemic disease. After reviewing the risks                            and benefits, the patient was deemed in                            satisfactory condition to undergo the procedure.  After obtaining informed consent, the colonoscope                            was passed under direct vision. Throughout the                            procedure, the patient's blood pressure, pulse, and                            oxygen saturations were monitored continuously. The                            Model CF-HQ190L [id number] [id number]) scope was introduced                            through the anus and advanced to the  the terminal                            ileum. The colonoscopy was performed without                            difficulty. The patient tolerated the procedure                            well. The quality of the bowel preparation was                            good. The terminal ileum, ileocecal valve,                            appendiceal orifice, and rectum were photographed. Scope In: 8:13:10 AM Scope Out: 8:26:33 AM Scope Withdrawal Time: 0 hours 10 minutes 37 seconds  Total Procedure Duration: 0 hours 13 minutes 23 seconds  Findings:                 The digital rectal exam was normal.                           There was evidence of a patent end-to-end                            ileo-colonic anastomosis found in the ileocecal                            valve. This was characterized by healthy appearing                            mucosa. This was traversed.                           The neo-terminal ileum appeared normal.                           A 4 mm polyp was found in the sigmoid colon. The  polyp was sessile. The polyp was removed with a                            cold snare. Resection and retrieval were complete.                           Internal hemorrhoids with hypertrophied papillae                            were found during retroflexion. The hemorrhoids                            were small.                           The exam was otherwise without abnormality. Complications:            No immediate complications. Estimated Blood Loss:     Estimated blood loss: none. Impression:               - Patent end-to-end ileo-colonic anastomosis,                            characterized by healthy appearing mucosa.                           - The examined portion of the ileum was normal.                           - One 4 mm polyp in the sigmoid colon, removed with                            a cold snare. Resected and retrieved.                            - Internal hemorrhoids and hypertrophied anal                            papillae.                           - The examination was otherwise normal. Recommendation:           - Patient has a contact number available for                            emergencies. The signs and symptoms of potential                            delayed complications were discussed with the                            patient. Return to normal activities tomorrow.                            Written discharge instructions were provided to the  patient.                           - Resume previous diet.                           - Continue present medications.                           - Await pathology results.                           - Repeat colonoscopy is recommended. The                            colonoscopy date will be determined after pathology                            results from today's exam become available for                            review. Jerene Bears, MD 12/13/2015 8:34:20 AM This report has been signed electronically.

## 2015-12-13 NOTE — Progress Notes (Signed)
Spontaneous respirations throughout. VSS. Resting comfortably. To PACU on room air. Report to  [facility]

## 2015-12-13 NOTE — Progress Notes (Signed)
Called to room to assist during endoscopic procedure.  Patient ID and intended procedure confirmed with present staff. Received instructions for my participation in the procedure from the performing physician.  

## 2015-12-14 ENCOUNTER — Telehealth: Payer: Self-pay

## 2015-12-14 NOTE — Telephone Encounter (Signed)
  Follow up Call-  Call back number 12/13/2015  Post procedure Call Back phone  # [phone]  Permission to leave phone message Yes  Some recent data might be hidden     Patient questions:  Do you have a fever, pain , or abdominal swelling? No. Pain Score  0 *  Have you tolerated food without any problems? Yes.    Have you been able to return to your normal activities? Yes.    Do you have any questions about your discharge instructions: Diet   No. Medications  No. Follow up visit  No.  Do you have questions or concerns about your Care? No.  Actions: * If pain score is 4 or above: No action needed, pain <4.  No problems per the pt. maw

## 2015-12-17 ENCOUNTER — Encounter: Payer: Self-pay | Admitting: Internal Medicine

## 2016-01-07 DIAGNOSIS — Z23 Encounter for immunization: Secondary | ICD-10-CM | POA: Diagnosis not present

## 2016-01-15 ENCOUNTER — Encounter: Payer: Self-pay | Admitting: Internal Medicine

## 2016-01-21 ENCOUNTER — Encounter: Payer: Self-pay | Admitting: Internal Medicine

## 2016-01-21 ENCOUNTER — Ambulatory Visit (INDEPENDENT_AMBULATORY_CARE_PROVIDER_SITE_OTHER): Payer: 59 | Admitting: Internal Medicine

## 2016-01-21 VITALS — BP 130/80 | HR 74 | Temp 98.0°F | Resp 20 | Ht 70.0 in | Wt 214.5 lb

## 2016-01-21 DIAGNOSIS — L308 Other specified dermatitis: Secondary | ICD-10-CM

## 2016-01-21 MED ORDER — HYDROCORTISONE 1 % EX CREA: 1.0000 "application " | TOPICAL_CREAM | Freq: Two times a day (BID) | CUTANEOUS | 0 refills | Status: DC

## 2016-01-21 NOTE — Progress Notes (Signed)
Pre visit review using our clinic review tool, if applicable. No additional management support is needed unless otherwise documented below in the visit note. 

## 2016-01-21 NOTE — Patient Instructions (Addendum)
HOME CARE INSTRUCTIONS  Skin care  Keep the affected area dry.  Do not use ointments or creams. These can make the condition worse.  Apply cool compresses to the affected areas.  Do not scratch your skin.  Do not take hot showers or baths. General Instructions   Take over-the-counter and prescription medicines only as told by your health care provider.  If you were prescribed an antibiotic, take it as told by your health care provider. Do not stop taking it even if your condition improves.  Stay in a cool room as much as possible. Use an air conditioner or fan, if possible.  Do not wear tight clothes. Wear comfortable, loose-fitting clothing. Do not use topical alcohol products Hydrocortisone cream 1% applied twice daily.  Do not use for greater than 2 weeks If symptoms are still present in 2 weeks, please call for dermatology referral  Benadryl 25 mg at bedtime

## 2016-01-21 NOTE — Progress Notes (Signed)
Subjective:    Patient ID: Ernest Hudson, male    DOB: 25-Jan-1956, 60 y.o.   MRN: [id number]  HPI  60 year old patient who is seen today with a chief complaint of an intermittent rash in the groin area.  He presents with a chief complaint of "jock itch". He has been using Lotrimin sporadically for primarily itching in the scrotal and groin area over the past year.  Symptoms seemed to improve for a bit but then recur.  He has used topical alcohol with some temporary benefit with the pruritus which is his chief complaint.  Itching is most prominent through the night. 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 Allergic rhinitis   . Anginal pain ([place])   . Arthritis    "probably in my fingers" (04/08/2013)  . Basal cell carcinoma 2000's   'chest"  . Birt-Hogg-Dube syndrome   . Bradycardia   . Dyslipidemia (high LDL; low HDL)   . 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 History of blood transfusion ?1995  . Hx of colonic polyps   . Hyperlipidemia   . Hypertension   . Ileus, postoperative ([place]) 04/17/2013  . Spontaneous pneumothorax 1995     Social History   Social History  . Marital status: Married    Spouse name: N/A  . Number of children: N/A  . Years of education: N/A   Occupational History  . Not on file.   Social History Main Topics  . Smoking status: Former Smoker    Packs/day: 2.00    Years: 30.00    Types: Cigarettes    Quit date: 07/30/2012  . Smokeless tobacco: Never Used  . Alcohol use No  . Drug use: No  . Sexual activity: Not Currently   Other Topics Concern  . Not on file   Social History Narrative  . No narrative on file    Past Surgical History:  Procedure Laterality Date  . APPENDECTOMY  04/07/2013  . APPENDECTOMY  04/07/2013   Procedure: OPEN APPENDECTOMY;  Surgeon: Gayland Curry, MD;  Location: [facility];   Service: General;;  . cardiolyte stress  04/2008  . CHOLECYSTECTOMY  2004  . COLONOSCOPY  06/2005  . ELBOW SURGERY Left 2001   "tendonitis"  . EXTERNAL EAR SURGERY  1971  . INCISIONAL HERNIA REPAIR N/A 05/12/2014   Procedure: OPEN REPAIR OF INCISIONAL HERNIA REPAIR;  Surgeon: Greer Pickerel, MD;  Location: [facility];  Service: General;  Laterality: N/A;  . INSERTION OF MESH N/A 05/12/2014   Procedure: WITH INSERTION OF MESH;  Surgeon: Greer Pickerel, MD;  Location: [facility];  Service: General;  Laterality: N/A;  . KNEE ARTHROSCOPY Left 1990's  . LAPAROSCOPIC APPENDECTOMY N/A 04/07/2013   Procedure: ATTEMPTED APPENDECTOMY LAPAROSCOPIC;  Surgeon: Gayland Curry, MD;  Location: [facility];  Service: General;  Laterality: N/A;  . LUNG SURGERY     "repaired ruptured bleb" '95  . MIDDLE EAR SURGERY Left 1972   exploratory tympanotomy/notes 07/17/2000  . NASAL SEPTUM SURGERY    . PARTIAL COLECTOMY  04/07/2013   Procedure: PARTIAL COLECTOMY, ILEOCECTOMY;  Surgeon: Gayland Curry, MD;  Location: [facility];  Service: General;;  . SKIN CANCER EXCISION  2000's   "chest"  . TENDON REPAIR Left 1990's   "thumb"    Family History  Problem Relation Age of Onset  . Healthy Sister   . Lung disease Sister     has blebs on MRI,   . Breast cancer Maternal Aunt     diagnosed in her 44s  . Melanoma Maternal Uncle   . Breast cancer Maternal Aunt     diagnosed in her 3s  . Breast cancer Maternal Aunt     diagnosed in her 44s  . Breast cancer Mother   . Diabetes Mother   . Heart attack Father   . Diabetes Father   . Prostate cancer Father   . Healthy Brother   . Healthy Brother   . Cancer Cousin     multiple myeloma  . Colon cancer Neg Hx     Allergies  Allergen Reactions  . Niacin     REACTION: rash    Current Outpatient Prescriptions on File Prior to Visit  Medication Sig Dispense Refill  . amLODipine (NORVASC) 10 MG tablet Take 1 tablet (10 mg total) by mouth daily. 30 tablet 10  . aspirin EC 81 MG tablet  Take 81 mg by mouth every morning.     . cetirizine (ZYRTEC) 10 MG tablet Take 10 mg by mouth every morning.     . fluticasone (FLONASE) 50 MCG/ACT nasal spray Place 2 sprays into the nose daily as needed. For allergies. 16 g 6  . ibuprofen (ADVIL,MOTRIN) 200 MG tablet You can take 2-3 of these every 6 hours as needed. 30 tablet 0  . lisinopril (PRINIVIL,ZESTRIL) 40 MG tablet Take 1 tablet (40 mg total) by mouth daily. 30 tablet 10  . Multiple Vitamin (MULTIVITAMIN) tablet Take 1 tablet by mouth daily.     Current Facility-Administered Medications on File Prior to Visit  Medication Dose Route Frequency Provider Last Rate Last Dose  . 0.9 %  sodium chloride infusion  500 mL Intravenous Continuous Lajuan Lines Pyrtle, MD        BP 130/80 (BP Location: Left Arm, Patient Position: Sitting, Cuff Size: Large)   Pulse 74   Temp 98 F (36.7 C) (Oral)   Resp 20   Ht 5' 10"  (1.778 m)   Wt 214 lb 8 oz (97.3 kg)   SpO2 98%   BMI 30.78 kg/m     Review of Systems  Skin: Positive for rash.       Objective:   Physical Exam  Constitutional: He appears well-developed and well-nourished. He appears distressed.  Skin:  An area of redness and excoriation at the base of the scrotal area near the perineum No other dermatitis appreciated          Assessment & Plan:   Complaint is more of chronic intermittent pruritus of the scrotal and groin area  This has been refractory to frequent applications of topical Lotrimin. We'll treat for a "heat rash" and patient will attempt to keep the area clean, dry and well ventilated He'll be treated with 1% hydrocortisone twice daily for maximum of 2 weeks.  He is aware of the dangers of chronic topical steroids.  If unimproved after 2 weeks or symptoms are recurrent,   will set up for dermatologic referral.  [name]

## 2016-02-07 MED FILL — AMLODIPINE BESYLATE 10 MG T: 10 | 90 days supply | Qty: 90 | Fill #1

## 2016-02-07 MED FILL — LISINOPRIL 40 MG TABLET: 40 | 90 days supply | Qty: 90 | Fill #1

## 2016-03-06 ENCOUNTER — Encounter: Payer: Self-pay | Admitting: Family Medicine

## 2016-03-06 ENCOUNTER — Ambulatory Visit (INDEPENDENT_AMBULATORY_CARE_PROVIDER_SITE_OTHER): Payer: 59 | Admitting: Family Medicine

## 2016-03-06 VITALS — BP 142/86 | HR 62 | Temp 98.7°F | Wt 220.0 lb

## 2016-03-06 DIAGNOSIS — J324 Chronic pansinusitis: Secondary | ICD-10-CM | POA: Diagnosis not present

## 2016-03-06 DIAGNOSIS — R05 Cough: Secondary | ICD-10-CM

## 2016-03-06 DIAGNOSIS — R059 Cough, unspecified: Secondary | ICD-10-CM

## 2016-03-06 MED ORDER — BENZONATATE 100 MG PO CAPS: 100.0000 mg | ORAL_CAPSULE | Freq: Three times a day (TID) | ORAL | 0 refills | Status: DC

## 2016-03-06 MED ORDER — HYDROCODONE-HOMATROPINE 5-1.5 MG/5ML PO SYRP: 5.0000 mL | ORAL_SOLUTION | Freq: Three times a day (TID) | ORAL | 0 refills | Status: DC | PRN

## 2016-03-06 MED ORDER — AMOXICILLIN-POT CLAVULANATE 875-125 MG PO TABS: 1.0000 | ORAL_TABLET | Freq: Two times a day (BID) | ORAL | 0 refills | Status: DC

## 2016-03-06 MED FILL — AMOX TR-K CLV 875-125 MG TA: 875-125 | 10 days supply | Qty: 20 | Fill #0

## 2016-03-06 MED FILL — BENZONATATE 100 MG CAPSULE: 100 | 7 days supply | Qty: 20 | Fill #0

## 2016-03-06 MED FILL — HYDROCODONE-HOMATROPINE SYR: 5-1.5 | 8 days supply | Qty: 120 | Fill #0

## 2016-03-06 NOTE — Progress Notes (Signed)
Subjective:    Patient ID: Ernest Hudson, male    DOB: [birth year], 61 y.o.   MRN: [id number]  HPI  Ernest Hudson is a 61 year old male who presents today with sinus pressure/pain for 2 weeks.  Associated nonproductive cough mainly with only a couple episodes of green sputum, rhinitis with green drainage, nasal congestion. He denies fever, chills, sweats, N/V/D, tooth pain, and ear pain. No aggravating or alleviating factors noted. Treatment at home includes Mucinex, Zyrtec and NyQuil has provided limited benefit. Recent sick contact exposure with wife. He is currently not smoking and quit over 3 years ago.   No history of asthma/COPD/bronchitis. No recent antibiotic use.  Review of Systems  Constitutional: Negative for chills, fatigue and fever.  HENT: Positive for congestion, postnasal drip, rhinorrhea, sinus pain and sinus pressure. Negative for ear pain and sore throat.   Eyes: Negative for visual disturbance.  Respiratory: Positive for cough. Negative for shortness of breath and wheezing.   Cardiovascular: Negative for chest pain and palpitations.  Gastrointestinal: Negative for abdominal pain, diarrhea, nausea and vomiting.  Skin: Negative for rash.  Neurological: Negative for dizziness, facial asymmetry, light-headedness and headaches.   Past Medical History:  Diagnosis Date  . 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 Allergic rhinitis   . Anginal pain ([place])   . Arthritis    "probably in my fingers" (04/08/2013)  . Basal cell carcinoma 2000's   'chest"  . Birt-Hogg-Dube syndrome   . Bradycardia   . Dyslipidemia (high LDL; low HDL)   . 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 History of blood transfusion ?1995  . Hx of colonic polyps   . Hyperlipidemia   . Hypertension   . Ileus, postoperative ([place]) 04/17/2013  . Spontaneous pneumothorax 1995     Social History   Social History  . Marital status: Married    Spouse name: N/A  . Number of  children: N/A  . Years of education: N/A   Occupational History  . Not on file.   Social History Main Topics  . Smoking status: Former Smoker    Packs/day: 2.00    Years: 30.00    Types: Cigarettes    Quit date: 07/30/2012  . Smokeless tobacco: Never Used  . Alcohol use No  . Drug use: No  . Sexual activity: Not Currently   Other Topics Concern  . Not on file   Social History Narrative  . No narrative on file    Past Surgical History:  Procedure Laterality Date  . APPENDECTOMY  04/07/2013  . APPENDECTOMY  04/07/2013   Procedure: OPEN APPENDECTOMY;  Surgeon: Gayland Curry, MD;  Location: [place] [facility];  Service: General;;  . cardiolyte stress  04/2008  . CHOLECYSTECTOMY  2004  . COLONOSCOPY  06/2005  . ELBOW SURGERY Left 2001   "tendonitis"  . EXTERNAL EAR SURGERY  1971  . INCISIONAL HERNIA REPAIR N/A 05/12/2014   Procedure: OPEN REPAIR OF INCISIONAL HERNIA REPAIR;  Surgeon: Greer Pickerel, MD;  [facility];  Service: General;  Laterality: N/A;  . INSERTION OF MESH N/A 05/12/2014   Procedure: WITH INSERTION OF MESH;  Surgeon: Greer Pickerel, MD;  [facility];  Service: General;  Laterality: N/A;  . KNEE ARTHROSCOPY Left 1990's  . LAPAROSCOPIC APPENDECTOMY N/A 04/07/2013   Procedure: ATTEMPTED APPENDECTOMY LAPAROSCOPIC;  Surgeon: Gayland Curry, MD;  Location: [facility];  Service: General;  Laterality: N/A;  .  LUNG SURGERY     "repaired ruptured bleb" '95  . MIDDLE EAR SURGERY Left 1972   exploratory tympanotomy/notes 07/17/2000  . NASAL SEPTUM SURGERY    . PARTIAL COLECTOMY  04/07/2013   Procedure: PARTIAL COLECTOMY, ILEOCECTOMY;  Surgeon: Gayland Curry, MD;  Location: [facility];  Service: General;;  . SKIN CANCER EXCISION  2000's   "chest"  . TENDON REPAIR Left 1990's   "thumb"    Family History  Problem Relation Age of Onset  . Healthy Sister   . Lung disease Sister     has blebs on MRI,   . Breast cancer Maternal Aunt     diagnosed in her 52s  . Melanoma Maternal Uncle   .  Breast cancer Maternal Aunt     diagnosed in her 44s  . Breast cancer Maternal Aunt     diagnosed in her 58s  . Breast cancer Mother   . Diabetes Mother   . Heart attack Father   . Diabetes Father   . Prostate cancer Father   . Healthy Brother   . Healthy Brother   . Cancer Cousin     multiple myeloma  . Colon cancer Neg Hx     Allergies  Allergen Reactions  . Niacin     REACTION: rash    Current Outpatient Prescriptions on File Prior to Visit  Medication Sig Dispense Refill  . amLODipine (NORVASC) 10 MG tablet Take 1 tablet (10 mg total) by mouth daily. 30 tablet 10  . aspirin EC 81 MG tablet Take 81 mg by mouth every morning.     . cetirizine (ZYRTEC) 10 MG tablet Take 10 mg by mouth every morning.     . fluticasone (FLONASE) 50 MCG/ACT nasal spray Place 2 sprays into the nose daily as needed. For allergies. 16 g 6  . hydrocortisone cream 1 % Apply 1 application topically 2 (two) times daily. 60 g 0  . ibuprofen (ADVIL,MOTRIN) 200 MG tablet You can take 2-3 of these every 6 hours as needed. 30 tablet 0  . lisinopril (PRINIVIL,ZESTRIL) 40 MG tablet Take 1 tablet (40 mg total) by mouth daily. 30 tablet 10  . Multiple Vitamin (MULTIVITAMIN) tablet Take 1 tablet by mouth daily.     Current Facility-Administered Medications on File Prior to Visit  Medication Dose Route Frequency Provider Last Rate Last Dose  . 0.9 %  sodium chloride infusion  500 mL Intravenous Continuous Jerene Bears, MD        BP (!) 142/86 (BP Location: Left Arm, Patient Position: Sitting, Cuff Size: Normal)   Pulse 62   Temp 98.7 F (37.1 C) (Oral)   Wt 220 lb (99.8 kg)   SpO2 97%   BMI 31.57 kg/m        Objective:   Physical Exam  Constitutional: He is oriented to person, place, and time. He appears well-developed and well-nourished.  HENT:  Right Ear: Tympanic membrane normal.  Left Ear: Tympanic membrane normal.  Nose: Rhinorrhea present. Right sinus exhibits maxillary sinus tenderness and  frontal sinus tenderness. Left sinus exhibits maxillary sinus tenderness and frontal sinus tenderness.  Mouth/Throat: Mucous membranes are normal. No oropharyngeal exudate or posterior oropharyngeal erythema.  Eyes: Pupils are equal, round, and reactive to light. No scleral icterus.  Neck: Neck supple.  Cardiovascular: Normal rate and regular rhythm.   Pulmonary/Chest: Effort normal and breath sounds normal. He has no wheezes. He has no rales.  Abdominal: Soft. Bowel sounds are normal. There is no  tenderness.  Lymphadenopathy:    He has cervical adenopathy.  Neurological: He is alert and oriented to person, place, and time.  Skin: Skin is warm and dry. No rash noted.  Psychiatric: He has a normal mood and affect. His behavior is normal. Judgment and thought content normal.       Assessment & Plan:  1. Pansinusitis, unspecified chronicity Duration of symptoms without improvement. Treat with Augmentin.  Advised follow up if symptoms do not improve in 3 to 4 days, worsen, or he develops a fever >101.   - amoxicillin-clavulanate (AUGMENTIN) 875-125 MG tablet; Take 1 tablet by mouth 2 (two) times daily.  Dispense: 20 tablet; Refill: 0  2. Cough  - benzonatate (TESSALON) 100 MG capsule; Take 1 capsule (100 mg total) by mouth 3 (three) times daily.  Dispense: 20 capsule; Refill: 0 - HYDROcodone-homatropine (HYCODAN) 5-1.5 MG/5ML syrup; Take 5 mLs by mouth every 8 (eight) hours as needed for cough.  Dispense: 120 mL; Refill: 0  Delano Metz, FNP-C

## 2016-03-06 NOTE — Patient Instructions (Signed)
Please take medication as directed and follow up if symptoms do not improve in 3 to 4 days, worsen, or you develop a fever >101.   Sinusitis, Adult Sinusitis is soreness and inflammation of your sinuses. Sinuses are hollow spaces in the bones around your face. They are located:  Around your eyes.  In the middle of your forehead.  Behind your nose.  In your cheekbones. Your sinuses and nasal passages are lined with a stringy fluid (mucus). Mucus normally drains out of your sinuses. When your nasal tissues get inflamed or swollen, the mucus can get trapped or blocked so air cannot flow through your sinuses. This lets bacteria, viruses, and funguses grow, and that leads to infection. Follow these instructions at home: Medicines  Take, use, or apply over-the-counter and prescription medicines only as told by your doctor. These may include nasal sprays.  If you were prescribed an antibiotic medicine, take it as told by your doctor. Do not stop taking the antibiotic even if you start to feel better. Hydrate and Humidify  Drink enough water to keep your pee (urine) clear or pale yellow.  Use a cool mist humidifier to keep the humidity level in your home above 50%.  Breathe in steam for 10-15 minutes, 3-4 times a day or as told by your doctor. You can do this in the bathroom while a hot shower is running.  Try not to spend time in cool or dry air. Rest  Rest as much as possible.  Sleep with your head raised (elevated).  Make sure to get enough sleep each night. General instructions  Put a warm, moist washcloth on your face 3-4 times a day or as told by your doctor. This will help with discomfort.  Wash your hands often with soap and water. If there is no soap and water, use hand sanitizer.  Do not smoke. Avoid being around people who are smoking (secondhand smoke).  Keep all follow-up visits as told by your doctor. This is important. Contact a doctor if:  You have a  fever.  Your symptoms get worse.  Your symptoms do not get better within 10 days. Get help right away if:  You have a very bad headache.  You cannot stop throwing up (vomiting).  You have pain or swelling around your face or eyes.  You have trouble seeing.  You feel confused.  Your neck is stiff.  You have trouble breathing. This information is not intended to replace advice given to you by your health care provider. Make sure you discuss any questions you have with your health care provider. Document Released: 07/26/2007 Document Revised: 10/03/2015 Document Reviewed: 12/02/2014 Elsevier Interactive Patient Education  2017 [employer].

## 2016-03-06 NOTE — Progress Notes (Signed)
Pre visit review using our clinic review tool, if applicable. No additional management support is needed unless otherwise documented below in the visit note. 

## 2016-03-20 DIAGNOSIS — M25561 Pain in right knee: Secondary | ICD-10-CM | POA: Diagnosis not present

## 2016-03-20 DIAGNOSIS — M79641 Pain in right hand: Secondary | ICD-10-CM | POA: Diagnosis not present

## 2016-05-08 ENCOUNTER — Emergency Department (HOSPITAL_COMMUNITY): Payer: 59

## 2016-05-08 ENCOUNTER — Encounter (HOSPITAL_COMMUNITY): Payer: Self-pay | Admitting: Emergency Medicine

## 2016-05-08 ENCOUNTER — Encounter: Payer: Self-pay | Admitting: Family Medicine

## 2016-05-08 ENCOUNTER — Ambulatory Visit (INDEPENDENT_AMBULATORY_CARE_PROVIDER_SITE_OTHER): Payer: 59 | Admitting: Family Medicine

## 2016-05-08 ENCOUNTER — Ambulatory Visit (INDEPENDENT_AMBULATORY_CARE_PROVIDER_SITE_OTHER)
Admission: RE | Admit: 2016-05-08 | Discharge: 2016-05-08 | Disposition: A | Discharge status: Home / Self Care | Payer: 59 | Source: Ambulatory Visit | Attending: Family Medicine | Admitting: Family Medicine

## 2016-05-08 ENCOUNTER — Inpatient Hospital Stay (HOSPITAL_COMMUNITY)
Admission: EM | Admit: 2016-05-08 | Discharge: 2016-05-12 | DRG: 199 | Disposition: A | Discharge status: Home / Self Care | Payer: 59 | Attending: Internal Medicine | Admitting: Internal Medicine

## 2016-05-08 VITALS — BP 128/86 | HR 99 | Temp 98.7°F | Ht 70.0 in | Wt 216.2 lb

## 2016-05-08 DIAGNOSIS — J180 Bronchopneumonia, unspecified organism: Secondary | ICD-10-CM | POA: Diagnosis present

## 2016-05-08 DIAGNOSIS — R05 Cough: Secondary | ICD-10-CM

## 2016-05-08 DIAGNOSIS — Z938 Other artificial opening status: Secondary | ICD-10-CM

## 2016-05-08 DIAGNOSIS — Z87891 Personal history of nicotine dependence: Secondary | ICD-10-CM

## 2016-05-08 DIAGNOSIS — Z7982 Long term (current) use of aspirin: Secondary | ICD-10-CM | POA: Diagnosis not present

## 2016-05-08 DIAGNOSIS — R059 Cough, unspecified: Secondary | ICD-10-CM

## 2016-05-08 DIAGNOSIS — H9192 Unspecified hearing loss, left ear: Secondary | ICD-10-CM | POA: Diagnosis present

## 2016-05-08 DIAGNOSIS — Z888 Allergy status to other drugs, medicaments and biological substances status: Secondary | ICD-10-CM

## 2016-05-08 DIAGNOSIS — Z978 Presence of other specified devices: Secondary | ICD-10-CM | POA: Diagnosis not present

## 2016-05-08 DIAGNOSIS — Z4682 Encounter for fitting and adjustment of non-vascular catheter: Secondary | ICD-10-CM

## 2016-05-08 DIAGNOSIS — Q8789 Other specified congenital malformation syndromes, not elsewhere classified: Secondary | ICD-10-CM | POA: Diagnosis not present

## 2016-05-08 DIAGNOSIS — J069 Acute upper respiratory infection, unspecified: Secondary | ICD-10-CM | POA: Diagnosis not present

## 2016-05-08 DIAGNOSIS — J9811 Atelectasis: Secondary | ICD-10-CM | POA: Diagnosis not present

## 2016-05-08 DIAGNOSIS — T797XXA Traumatic subcutaneous emphysema, initial encounter: Secondary | ICD-10-CM | POA: Diagnosis not present

## 2016-05-08 DIAGNOSIS — Z9689 Presence of other specified functional implants: Secondary | ICD-10-CM

## 2016-05-08 DIAGNOSIS — Y838 Other surgical procedures as the cause of abnormal reaction of the patient, or of later complication, without mention of misadventure at the time of the procedure: Secondary | ICD-10-CM | POA: Diagnosis not present

## 2016-05-08 DIAGNOSIS — J9383 Other pneumothorax: Principal | ICD-10-CM | POA: Diagnosis present

## 2016-05-08 DIAGNOSIS — R0602 Shortness of breath: Secondary | ICD-10-CM

## 2016-05-08 DIAGNOSIS — R918 Other nonspecific abnormal finding of lung field: Secondary | ICD-10-CM | POA: Diagnosis present

## 2016-05-08 DIAGNOSIS — J9311 Primary spontaneous pneumothorax: Secondary | ICD-10-CM | POA: Diagnosis not present

## 2016-05-08 DIAGNOSIS — J989 Respiratory disorder, unspecified: Secondary | ICD-10-CM | POA: Diagnosis not present

## 2016-05-08 DIAGNOSIS — J9382 Other air leak: Secondary | ICD-10-CM | POA: Diagnosis not present

## 2016-05-08 DIAGNOSIS — Z79899 Other long term (current) drug therapy: Secondary | ICD-10-CM | POA: Diagnosis not present

## 2016-05-08 DIAGNOSIS — I10 Essential (primary) hypertension: Secondary | ICD-10-CM | POA: Diagnosis not present

## 2016-05-08 DIAGNOSIS — J939 Pneumothorax, unspecified: Secondary | ICD-10-CM | POA: Diagnosis not present

## 2016-05-08 LAB — CBC WITH DIFFERENTIAL/PLATELET
BASOS ABS: 0 10*3/uL (ref 0.0–0.1)
Basophils Relative: 0 %
EOS PCT: 7 %
Eosinophils Absolute: 0.3 10*3/uL (ref 0.0–0.7)
HEMATOCRIT: 44.8 % (ref 39.0–52.0)
HEMOGLOBIN: 15.6 g/dL (ref 13.0–17.0)
LYMPHS ABS: 1.2 10*3/uL (ref 0.7–4.0)
LYMPHS PCT: 30 %
MCH: 29.2 pg (ref 26.0–34.0)
MCHC: 34.8 g/dL (ref 30.0–36.0)
MCV: 83.7 fL (ref 78.0–100.0)
Monocytes Absolute: 0.6 10*3/uL (ref 0.1–1.0)
Monocytes Relative: 15 %
NEUTROS ABS: 1.9 10*3/uL (ref 1.7–7.7)
NEUTROS PCT: 48 %
PLATELETS: 203 10*3/uL (ref 150–400)
RBC: 5.35 MIL/uL (ref 4.22–5.81)
RDW: 13.3 % (ref 11.5–15.5)
WBC: 4 10*3/uL (ref 4.0–10.5)

## 2016-05-08 LAB — BASIC METABOLIC PANEL
ANION GAP: 8 (ref 5–15)
BUN: 15 mg/dL (ref 6–20)
CALCIUM: 9.4 mg/dL (ref 8.9–10.3)
CO2: 24 mmol/L (ref 22–32)
Chloride: 108 mmol/L (ref 101–111)
Creatinine, Ser: 1 mg/dL (ref 0.61–1.24)
GFR calc Af Amer: >60 mL/min (ref >60)
Glucose, Bld: 128 mg/dL — ABNORMAL HIGH (ref 65–99)
Potassium: 3.8 mmol/L (ref 3.5–5.1)
Sodium: 140 mmol/L (ref 135–145)

## 2016-05-08 MED ORDER — LISINOPRIL 40 MG PO TABS
40.0000 mg | ORAL_TABLET | Freq: Every day | ORAL | Status: DC
  Administered 2016-05-09 – 2016-05-12 (×4): 40 mg via ORAL
  Filled 2016-05-08 (×4): qty 1

## 2016-05-08 MED ORDER — LORATADINE 10 MG PO TABS
10.0000 mg | ORAL_TABLET | Freq: Every day | ORAL | Status: DC
  Administered 2016-05-09 – 2016-05-12 (×4): 10 mg via ORAL
  Filled 2016-05-08 (×4): qty 1

## 2016-05-08 MED ORDER — MORPHINE SULFATE (PF) 4 MG/ML IV SOLN
2.0000 mg | INTRAVENOUS | Status: DC | PRN
  Administered 2016-05-09 (×4): 4 mg via INTRAVENOUS
  Filled 2016-05-08 (×4): qty 1

## 2016-05-08 MED ORDER — FLUTICASONE PROPIONATE 50 MCG/ACT NA SUSP
2.0000 | Freq: Every day | NASAL | Status: DC
  Administered 2016-05-09 – 2016-05-12 (×4): 2 via NASAL
  Filled 2016-05-08 (×2): qty 16

## 2016-05-08 MED ORDER — IBUPROFEN 200 MG PO TABS
600.0000 mg | ORAL_TABLET | Freq: Four times a day (QID) | ORAL | Status: DC | PRN
  Administered 2016-05-09 – 2016-05-11 (×5): 600 mg via ORAL
  Filled 2016-05-08 (×5): qty 3

## 2016-05-08 MED ORDER — BENZONATATE 100 MG PO CAPS: 100.0000 mg | ORAL_CAPSULE | Freq: Three times a day (TID) | ORAL | Status: DC | PRN

## 2016-05-08 MED ORDER — BENZONATATE 100 MG PO CAPS: 100.0000 mg | ORAL_CAPSULE | Freq: Three times a day (TID) | ORAL | 0 refills | Status: DC | PRN

## 2016-05-08 MED ORDER — ENOXAPARIN SODIUM 40 MG/0.4ML ~~LOC~~ SOLN
40.0000 mg | Freq: Every day | SUBCUTANEOUS | Status: DC
  Administered 2016-05-09 – 2016-05-12 (×4): 40 mg via SUBCUTANEOUS
  Filled 2016-05-08 (×4): qty 0.4

## 2016-05-08 MED ORDER — SODIUM CHLORIDE 0.9% FLUSH
3.0000 mL | Freq: Two times a day (BID) | INTRAVENOUS | Status: DC
  Administered 2016-05-09 (×2): 3 mL via INTRAVENOUS
  Administered 2016-05-09: 10 mL via INTRAVENOUS
  Administered 2016-05-10 – 2016-05-12 (×4): 3 mL via INTRAVENOUS

## 2016-05-08 MED ORDER — SODIUM CHLORIDE 0.9 % IV SOLN
INTRAVENOUS | Status: DC
  Administered 2016-05-08 – 2016-05-09 (×2): via INTRAVENOUS

## 2016-05-08 MED ORDER — LIDOCAINE HCL 1 % IJ SOLN
INTRAMUSCULAR | Status: AC
  Administered 2016-05-08: 20 mL
  Filled 2016-05-08: qty 20

## 2016-05-08 MED ORDER — AMLODIPINE BESYLATE 5 MG PO TABS
10.0000 mg | ORAL_TABLET | Freq: Every day | ORAL | Status: DC
  Administered 2016-05-09 – 2016-05-12 (×4): 10 mg via ORAL
  Filled 2016-05-08 (×4): qty 2

## 2016-05-08 MED ORDER — ASPIRIN EC 81 MG PO TBEC
81.0000 mg | DELAYED_RELEASE_TABLET | Freq: Every morning | ORAL | Status: DC
  Administered 2016-05-09 – 2016-05-12 (×4): 81 mg via ORAL
  Filled 2016-05-08 (×4): qty 1

## 2016-05-08 MED ORDER — MORPHINE SULFATE (PF) 4 MG/ML IV SOLN
4.0000 mg | Freq: Once | INTRAVENOUS | Status: AC
  Administered 2016-05-08: 4 mg via INTRAVENOUS
  Filled 2016-05-08: qty 1

## 2016-05-08 MED FILL — BENZONATATE 100 MG CAP: 100 | 6 days supply | Qty: 20 | Fill #0

## 2016-05-08 NOTE — Progress Notes (Signed)
Pre visit review using our clinic review tool, if applicable. No additional management support is needed unless otherwise documented below in the visit note. 

## 2016-05-08 NOTE — ED Notes (Signed)
Post procedure note: pt tolerating drain well. Cont on cardiac monitor. CT device to -20mcH20 pressure. No drainage noted at this time. PCXR done immediately, viewed by Dr Servando Snare, MD

## 2016-05-08 NOTE — ED Notes (Signed)
Bedside reporting done with Cyril Mourning, RN.

## 2016-05-08 NOTE — H&P (Addendum)
History and Physical    Ernest Hudson:[id number] DOB: 07/17/1955 DOA: 05/08/2016  PCP: [name], MD  Patient coming from: Home  I have personally briefly reviewed patient's old medical records in [place]  Chief Complaint: "Collapsed Lung"  HPI: Ernest Hudson is a 61 y.o. 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  Patient presents to the ED from his PCPs office with c/o SOB.  He has had cough and URI symptoms over the past couple of days, developed resting dyspnea and so went to PCPs office where CXR demonstrates a large R sided PTX.   ED Course: Dr. Servando Snare called by EDP and has placed a R sided CT.  He wants patient admitted to [facility] by medicine.   Review of Systems: As per HPI otherwise 10 point review of systems negative.   Past Medical History:  Diagnosis Date  . 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 Allergic rhinitis   . Anginal pain ([place])   . Arthritis    "probably in my fingers" (04/08/2013)  . Basal cell carcinoma 2000's   'chest"  . Birt-Hogg-Dube syndrome   . Bradycardia   . Dyslipidemia (high LDL; low HDL)   . 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 History of blood transfusion ?1995  . Hx of colonic polyps   . Hyperlipidemia   . Hypertension   . Ileus, postoperative ([place]) 04/17/2013  . Spontaneous pneumothorax 1995    Past Surgical History:  Procedure Laterality Date  . APPENDECTOMY  04/07/2013  . APPENDECTOMY  04/07/2013   Procedure: OPEN APPENDECTOMY;  Surgeon: Gayland Curry, MD;  Location: [place];  Service: General;;  . cardiolyte stress  04/2008  . CHOLECYSTECTOMY  2004  . COLONOSCOPY  06/2005  . ELBOW SURGERY Left 2001   "tendonitis"  . EXTERNAL EAR SURGERY  1971  . INCISIONAL HERNIA REPAIR N/A 05/12/2014   Procedure: OPEN REPAIR OF INCISIONAL HERNIA REPAIR;  Surgeon: Greer Pickerel, MD;  Location[facility];  Service: General;   Laterality: N/A;  . INSERTION OF MESH N/A 05/12/2014   Procedure: WITH INSERTION OF MESH;  Surgeon: Greer Pickerel, MD;  Location[facility];  Service: General;  Laterality: N/A;  . KNEE ARTHROSCOPY Left 1990's  . LAPAROSCOPIC APPENDECTOMY N/A 04/07/2013   Procedure: ATTEMPTED APPENDECTOMY LAPAROSCOPIC;  Surgeon: Gayland Curry, MD;  Location: [facility];  Service: General;  Laterality: N/A;  . LUNG SURGERY     "repaired ruptured bleb" '95  . MIDDLE EAR SURGERY Left 1972   exploratory tympanotomy/notes 07/17/2000  . NASAL SEPTUM SURGERY    . PARTIAL COLECTOMY  04/07/2013   Procedure: PARTIAL COLECTOMY, ILEOCECTOMY;  Surgeon: Gayland Curry, MD;  Location: [facility];  Service: General;;  . SKIN CANCER EXCISION  2000's   "chest"  . TENDON REPAIR Left 1990's   "thumb"     reports that he quit smoking about 3 years ago. His smoking use included Cigarettes. He has a 60.00 pack-year smoking history. He has never used smokeless tobacco. He reports that he does not drink alcohol or use drugs.  Allergies  Allergen Reactions  . Niacin     REACTION: rash    Family History  Problem Relation Age of Onset  . Healthy Sister   . Lung disease Sister     has blebs on MRI,   . Breast cancer Maternal Aunt  diagnosed in her 19s  . Melanoma Maternal Uncle   . Breast cancer Maternal Aunt     diagnosed in her 32s  . Breast cancer Maternal Aunt     diagnosed in her 24s  . Breast cancer Mother   . Diabetes Mother   . Heart attack Father   . Diabetes Father   . Prostate cancer Father   . Healthy Brother   . Healthy Brother   . Cancer Cousin     multiple myeloma  . Colon cancer Neg Hx      Prior to Admission medications   Medication Sig Start Date End Date Taking? Authorizing Provider  amLODipine (NORVASC) 10 MG tablet Take 1 tablet (10 mg total) by mouth daily. 09/30/14  Yes Marletta Lor, MD  aspirin EC 81 MG tablet Take 81 mg by mouth every morning.    Yes Historical Provider, MD  benzonatate  (TESSALON PERLES) 100 MG capsule Take 1 capsule (100 mg total) by mouth 3 (three) times daily as needed. 05/08/16  Yes Lucretia Kern, DO  cetirizine (ZYRTEC) 10 MG tablet Take 10 mg by mouth every morning.    Yes Historical Provider, MD  fluticasone (FLONASE) 50 MCG/ACT nasal spray Place 2 sprays into the nose daily as needed. For allergies. 05/07/12  Yes Marletta Lor, MD  ibuprofen (ADVIL,MOTRIN) 200 MG tablet You can take 2-3 of these every 6 hours as needed. Patient taking differently: Take 600 mg by mouth every 6 (six) hours as needed. You can take 2-3 of these every 6 hours as needed. 04/17/13  Yes Earnstine Regal, PA-C  lisinopril (PRINIVIL,ZESTRIL) 40 MG tablet Take 1 tablet (40 mg total) by mouth daily. 09/30/14  Yes Marletta Lor, MD  Multiple Vitamin (MULTIVITAMIN) tablet Take 1 tablet by mouth daily.   Yes Historical Provider, MD  OVER THE COUNTER MEDICATION 1 tablet daily. "bio flex"   Yes Historical Provider, MD  Probiotic Product (PROBIOTIC PO) Take 1 capsule by mouth daily.   Yes Historical Provider, MD    Physical Exam: Vitals:   05/08/16 2224 05/08/16 2230 05/08/16 2245 05/08/16 2300  BP: 130/78 107/82 (!) 142/78 131/82  Pulse: 86 85 70 82  Resp: '20 14 12 13  ' Temp:      TempSrc:      SpO2: 94% 92% 94% 93%  Weight:      Height:        Constitutional: NAD, calm, comfortable Eyes: PERRL, lids and conjunctivae normal ENMT: Mucous membranes are moist. Posterior pharynx clear of any exudate or lesions.Normal dentition.  Neck: normal, supple, no masses, no thyromegaly Respiratory: clear to auscultation bilaterally, no wheezing, no crackles. Normal respiratory effort. No accessory muscle use.  Cardiovascular: Regular rate and rhythm, no murmurs / rubs / gallops. No extremity edema. 2+ pedal pulses. No carotid bruits.  Abdomen: no tenderness, no masses palpated. No hepatosplenomegaly. Bowel sounds positive.  Musculoskeletal: no clubbing / cyanosis. No joint deformity  upper and lower extremities. Good ROM, no contractures. Normal muscle tone.  Skin: no rashes, lesions, ulcers. No induration Neurologic: CN 2-12 grossly intact. Sensation intact, DTR normal. Strength 5/5 in all 4.  Psychiatric: Normal judgment and insight. Alert and oriented x 3. Normal mood.    Labs on Admission: I have personally reviewed following labs and imaging studies  CBC:  Recent Labs Lab 05/08/16 1845  WBC 4.0  NEUTROABS 1.9  HGB 15.6  HCT 44.8  MCV 83.7  PLT 767   Basic Metabolic Panel:  Recent Labs Lab 05/08/16 1845  NA 140  K 3.8  CL 108  CO2 24  GLUCOSE 128*  BUN 15  CREATININE 1.00  CALCIUM 9.4   GFR: Estimated Creatinine Clearance: 91.1 mL/min (by C-G formula based on SCr of 1 mg/dL). Liver Function Tests: No results for input(s): AST, ALT, ALKPHOS, BILITOT, PROT, ALBUMIN in the last 168 hours. No results for input(s): LIPASE, AMYLASE in the last 168 hours. No results for input(s): AMMONIA in the last 168 hours. Coagulation Profile: No results for input(s): INR, PROTIME in the last 168 hours. Cardiac Enzymes: No results for input(s): CKTOTAL, CKMB, CKMBINDEX, TROPONINI in the last 168 hours. BNP (last 3 results) No results for input(s): PROBNP in the last 8760 hours. HbA1C: No results for input(s): HGBA1C in the last 72 hours. CBG: No results for input(s): GLUCAP in the last 168 hours. Lipid Profile: No results for input(s): CHOL, HDL, LDLCALC, TRIG, CHOLHDL, LDLDIRECT in the last 72 hours. Thyroid Function Tests: No results for input(s): TSH, T4TOTAL, FREET4, T3FREE, THYROIDAB in the last 72 hours. Anemia Panel: No results for input(s): VITAMINB12, FOLATE, FERRITIN, TIBC, IRON, RETICCTPCT in the last 72 hours. Urine analysis:    Component Value Date/Time   COLORURINE ORANGE (A) 04/07/2013 1124   APPEARANCEUR CLOUDY (A) 04/07/2013 1124   LABSPEC 1.036 (H) 04/07/2013 1124   PHURINE 5.5 04/07/2013 1124   GLUCOSEU NEGATIVE 04/07/2013 1124    HGBUR NEGATIVE 04/07/2013 1124   HGBUR negative 10/21/2009 0803   BILIRUBINUR n 09/13/2015 1037   KETONESUR 15 (A) 04/07/2013 1124   PROTEINUR n 09/13/2015 1037   PROTEINUR 30 (A) 04/07/2013 1124   UROBILINOGEN 0.2 09/13/2015 1037   UROBILINOGEN 1.0 04/07/2013 1124   NITRITE n 09/13/2015 1037   NITRITE POSITIVE (A) 04/07/2013 1124   LEUKOCYTESUR Negative 09/13/2015 1037    Radiological Exams on Admission: Dg Chest 2 View  Result Date: 05/08/2016 CLINICAL DATA:  Shortness of breath, cough. EXAM: CHEST  2 VIEW COMPARISON:  Radiograph of April 07, 2013. FINDINGS: Stable cardiomediastinal silhouette. Stable left basilar scarring is noted. Atherosclerosis of thoracic aorta is noted. There is interval development of large right pneumothorax which is greater than 50%. Bony thorax is unremarkable. IMPRESSION: Large right pneumothorax. Critical Value/emergent results were called by telephone at the time of interpretation on 05/08/2016 at 4:45 pm to Dr. Colin Benton , who verbally acknowledged these results. Electronically Signed   By: Marijo Conception, M.D.   On: 05/08/2016 16:45   Dg Chest Port 1 View  Result Date: 05/08/2016 CLINICAL DATA:  Right chest tube insertion EXAM: PORTABLE CHEST 1 VIEW COMPARISON:  05/08/2016 FINDINGS: AP semi-erect view chest demonstrates insertion of a right lower chest drainage catheter with the tip overlying the lateral lower lung. There is decreased size of right pneumothorax with a small residual lateral pneumothorax noted. Decreased shift of the contents to the left. There is linear scarring in the left lower lung and right lung base. Stable heart size. IMPRESSION: Placement of a right lower chest drainage catheter with decreased right-sided pneumothorax. A small residual right lateral pneumothorax is visualized. Electronically Signed   By: Donavan Foil M.D.   On: 05/08/2016 22:44    EKG: Independently reviewed.  Assessment/Plan Principal Problem:   Pneumothorax  on right Active Problems:   Essential hypertension   Birt-Hogg-Dube syndrome   URI (upper respiratory infection)    1. R PTX - 1. CT in place 2. Admitting to [facility] 3. Morphine PRN pain 2. HTN - continue  home BP meds 3. Birt-Hogg-Dube syndrome - probably responsible for PTX in setting of URI.  DVT prophylaxis: Lovenox Code Status: Full Family Communication: No family in room Disposition Plan: Home after admission Consults called: Dr. Servando Snare has seen patient Admission status: Admit to inpatient   Etta Quill DO [facility] Pager [phone]  If 7AM-7PM, please contact day team taking care of patient [email or web address] Password [email or web address]  05/08/2016, 11:50 PM

## 2016-05-08 NOTE — ED Provider Notes (Signed)
[facility] DEPT Provider Note   CSN: [id number] Arrival date & time: 05/08/16  1731     History   Chief Complaint Chief Complaint  Patient presents with  . collapsed lung    HPI Ernest Hudson is a 61 y.o. male.  61 year old male presents here with cough and congestion times several days. Saw his doctor today and was diagnosed with a right-sided pneumothorax after chest x-ray results. Does have a history of prior pneumothorax with chest tube placement approximately 2 years ago. Denies any syncope or near-syncope. Notes resting dyspnea. Denies any chest discomfort. No treatment use prior to arrival. Symptoms worse with exertion.      Past Medical History:  Diagnosis Date  . 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 Allergic rhinitis   . Anginal pain ([place])   . Arthritis    "probably in my fingers" (04/08/2013)  . Basal cell carcinoma 2000's   'chest"  . Birt-Hogg-Dube syndrome   . Bradycardia   . Dyslipidemia (high LDL; low HDL)   . 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 History of blood transfusion ?1995  . Hx of colonic polyps   . Hyperlipidemia   . Hypertension   . Ileus, postoperative ([place]) 04/17/2013  . Spontaneous pneumothorax 1995    Patient Active Problem List   Diagnosis Date Noted  . Incisional hernia s/p open repair w mesh 05/13/2014 05/12/2014  . 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 Right arm pain 10/03/2011  . Tobacco abuse 11/03/2010  . Dermatitis associated with moisture 12/29/2009  . BACK PAIN 12/31/2008  . CHEST PAIN 04/17/2008  . BRADYCARDIA 04/10/2008  . URI 02/24/2008  . HYPERLIPIDEMIA 10/29/2007  . RHINITIS MEDICAMENTOSA 08/26/2007  . Essential hypertension 10/02/2006  . ALLERGIC RHINITIS 10/02/2006    Past Surgical History:  Procedure Laterality Date  . APPENDECTOMY  04/07/2013  . APPENDECTOMY  04/07/2013   Procedure: OPEN APPENDECTOMY;  Surgeon: Gayland Curry, MD;  Location: [facility];  Service: General;;  .  cardiolyte stress  04/2008  . CHOLECYSTECTOMY  2004  . COLONOSCOPY  06/2005  . ELBOW SURGERY Left 2001   "tendonitis"  . EXTERNAL EAR SURGERY  1971  . INCISIONAL HERNIA REPAIR N/A 05/12/2014   Procedure: OPEN REPAIR OF INCISIONAL HERNIA REPAIR;  Surgeon: Greer Pickerel, MD;  [facility];  Service: General;  Laterality: N/A;  . INSERTION OF MESH N/A 05/12/2014   Procedure: WITH INSERTION OF MESH;  Surgeon: Greer Pickerel, MD;  [facility];  Service: General;  Laterality: N/A;  . KNEE ARTHROSCOPY Left 1990's  . LAPAROSCOPIC APPENDECTOMY N/A 04/07/2013   Procedure: ATTEMPTED APPENDECTOMY LAPAROSCOPIC;  Surgeon: Gayland Curry, MD;  Location: [facility];  Service: General;  Laterality: N/A;  . LUNG SURGERY     "repaired ruptured bleb" '95  . MIDDLE EAR SURGERY Left 1972   exploratory tympanotomy/notes 07/17/2000  . NASAL SEPTUM SURGERY    . PARTIAL COLECTOMY  04/07/2013   Procedure: PARTIAL COLECTOMY, ILEOCECTOMY;  Surgeon: Gayland Curry, MD;  Location: [facility];  Service: General;;  . SKIN CANCER EXCISION  2000's   "chest"  . TENDON REPAIR Left 1990's   "thumb"       Home Medications    Prior to Admission medications   Medication Sig Start Date End Date Taking? Authorizing Provider  amLODipine (NORVASC) 10 MG tablet Take 1 tablet (10 mg total) by mouth daily. 09/30/14  Yes Marletta Lor, MD  aspirin  EC 81 MG tablet Take 81 mg by mouth every morning.    Yes Historical Provider, MD  cetirizine (ZYRTEC) 10 MG tablet Take 10 mg by mouth every morning.    Yes Historical Provider, MD  lisinopril (PRINIVIL,ZESTRIL) 40 MG tablet Take 1 tablet (40 mg total) by mouth daily. 09/30/14  Yes Marletta Lor, MD  Multiple Vitamin (MULTIVITAMIN) tablet Take 1 tablet by mouth daily.   Yes Historical Provider, MD  OVER THE COUNTER MEDICATION 1 tablet daily. "bio flex"   Yes Historical Provider, MD  Probiotic Product (PROBIOTIC PO) Take 1 capsule by mouth daily.   Yes Historical Provider, MD    benzonatate (TESSALON PERLES) 100 MG capsule Take 1 capsule (100 mg total) by mouth 3 (three) times daily as needed. 05/08/16   Lucretia Kern, DO  fluticasone (FLONASE) 50 MCG/ACT nasal spray Place 2 sprays into the nose daily as needed. For allergies. 05/07/12   Marletta Lor, MD  ibuprofen (ADVIL,MOTRIN) 200 MG tablet You can take 2-3 of these every 6 hours as needed. 04/17/13   Earnstine Regal, PA-C    Family History Family History  Problem Relation Age of Onset  . Healthy Sister   . Lung disease Sister     has blebs on MRI,   . Breast cancer Maternal Aunt     diagnosed in her 59s  . Melanoma Maternal Uncle   . Breast cancer Maternal Aunt     diagnosed in her 77s  . Breast cancer Maternal Aunt     diagnosed in her 39s  . Breast cancer Mother   . Diabetes Mother   . Heart attack Father   . Diabetes Father   . Prostate cancer Father   . Healthy Brother   . Healthy Brother   . Cancer Cousin     multiple myeloma  . Colon cancer Neg Hx     Social History Social History  Substance Use Topics  . Smoking status: Former Smoker    Packs/day: 2.00    Years: 30.00    Types: Cigarettes    Quit date: 07/30/2012  . Smokeless tobacco: Never Used  . Alcohol use No     Allergies   Niacin   Review of Systems Review of Systems  All other systems reviewed and are negative.    Physical Exam Updated Vital Signs BP (!) 179/106 (BP Location: Left Arm)   Pulse 90   Temp 98.9 F (37.2 C) (Oral)   Resp 18   SpO2 94%   Physical Exam  Constitutional: He is oriented to person, place, and time. He appears well-developed and well-nourished.  Non-toxic appearance. No distress.  HENT:  Head: Normocephalic and atraumatic.  Eyes: Conjunctivae, EOM and lids are normal. Pupils are equal, round, and reactive to light.  Neck: Normal range of motion. Neck supple. No tracheal deviation present. No thyroid mass present.  Cardiovascular: Normal rate, regular rhythm and normal heart  sounds.  Exam reveals no gallop.   No murmur heard. Pulmonary/Chest: Effort normal. No stridor. No respiratory distress. He has decreased breath sounds in the right upper field, the right middle field and the right lower field. He has no wheezes. He has no rhonchi. He has no rales.  Abdominal: Soft. Normal appearance and bowel sounds are normal. He exhibits no distension. There is no tenderness. There is no rebound and no CVA tenderness.  Musculoskeletal: Normal range of motion. He exhibits no edema or tenderness.  Neurological: He is alert and oriented to  person, place, and time. He has normal strength. No cranial nerve deficit or sensory deficit. GCS eye subscore is 4. GCS verbal subscore is 5. GCS motor subscore is 6.  Skin: Skin is warm and dry. No abrasion and no rash noted.  Psychiatric: He has a normal mood and affect. His speech is normal and behavior is normal.  Nursing note and vitals reviewed.    ED Treatments / Results  Labs (all labs ordered are listed, but only abnormal results are displayed) Labs Reviewed - No data to display  EKG  EKG Interpretation None       Radiology Dg Chest 2 View  Result Date: 05/08/2016 CLINICAL DATA:  Shortness of breath, cough. EXAM: CHEST  2 VIEW COMPARISON:  Radiograph of April 07, 2013. FINDINGS: Stable cardiomediastinal silhouette. Stable left basilar scarring is noted. Atherosclerosis of thoracic aorta is noted. There is interval development of large right pneumothorax which is greater than 50%. Bony thorax is unremarkable. IMPRESSION: Large right pneumothorax. Critical Value/emergent results were called by telephone at the time of interpretation on 05/08/2016 at 4:45 pm to Dr. Colin Benton , who verbally acknowledged these results. Electronically Signed   By: Marijo Conception, M.D.   On: 05/08/2016 16:45    Procedures Procedures (including critical care time)  Medications Ordered in ED Medications - No data to display   Initial  Impression / Assessment and Plan / ED Course  I have reviewed the triage vital signs and the nursing notes.  Pertinent labs & imaging results that were available during my care of the patient were reviewed by me and considered in my medical decision making (see chart for details).     Patient hemodynamically stable at this time. Discussed with pulmonary critical care who defers to thoracic surgery. Spoke with thoracic surgeon on call who will come and see the patient place a chest tube  Final Clinical Impressions(s) / ED Diagnoses   Final diagnoses:  None    New Prescriptions New Prescriptions   No medications on file     Lacretia Leigh, MD 05/08/16 2130

## 2016-05-08 NOTE — ED Notes (Signed)
Pt on cont cardiac monitoring, with int NBP and cont POX, O2 via [place] at 4lpm initated prior to procedure. Pt alert and oriented

## 2016-05-08 NOTE — Progress Notes (Signed)
HPI:  Cough, congestion -started: 2-3 days ago -symptoms:nasal congestion, sore throat, cough, low grade temps, felt wheezy a few times -denies:fever, SOB, NVD, tooth pain, sinus pain -has tried: nothing -sick contacts/travel/risks: no reported flu, strep or tick exposure - wife with same symptoms -Hx of: allergies and reports hx cysts all over lung and hx collapsed L lung  ROS: See pertinent positives and negatives per HPI.  Past Medical History:  Diagnosis Date  . 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 Allergic rhinitis   . Anginal pain ([place])   . Arthritis    "probably in my fingers" (04/08/2013)  . Basal cell carcinoma 2000's   'chest"  . Birt-Hogg-Dube syndrome   . Bradycardia   . Dyslipidemia (high LDL; low HDL)   . 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 History of blood transfusion ?1995  . Hx of colonic polyps   . Hyperlipidemia   . Hypertension   . Ileus, postoperative ([place]) 04/17/2013  . Spontaneous pneumothorax 1995    Past Surgical History:  Procedure Laterality Date  . APPENDECTOMY  04/07/2013  . APPENDECTOMY  04/07/2013   Procedure: OPEN APPENDECTOMY;  Surgeon: Gayland Curry, MD;  Location: [facility];  Service: General;;  . cardiolyte stress  04/2008  . CHOLECYSTECTOMY  2004  . COLONOSCOPY  06/2005  . ELBOW SURGERY Left 2001   "tendonitis"  . EXTERNAL EAR SURGERY  1971  . INCISIONAL HERNIA REPAIR N/A 05/12/2014   Procedure: OPEN REPAIR OF INCISIONAL HERNIA REPAIR;  Surgeon: Greer Pickerel, MD;  Location[facility];  Service: General;  Laterality: N/A;  . INSERTION OF MESH N/A 05/12/2014   Procedure: WITH INSERTION OF MESH;  Surgeon: Greer Pickerel, MD;  Location: [facility];  Service: General;  Laterality: N/A;  . KNEE ARTHROSCOPY Left 1990's  . LAPAROSCOPIC APPENDECTOMY N/A 04/07/2013   Procedure: ATTEMPTED APPENDECTOMY LAPAROSCOPIC;  Surgeon: Gayland Curry, MD;  Location: [facility];  Service: General;  Laterality: N/A;  . LUNG SURGERY     "repaired  ruptured bleb" '95  . MIDDLE EAR SURGERY Left 1972   exploratory tympanotomy/notes 07/17/2000  . NASAL SEPTUM SURGERY    . PARTIAL COLECTOMY  04/07/2013   Procedure: PARTIAL COLECTOMY, ILEOCECTOMY;  Surgeon: Gayland Curry, MD;  Location: [employer];  Service: General;;  . SKIN CANCER EXCISION  2000's   "chest"  . TENDON REPAIR Left 1990's   "thumb"    Family History  Problem Relation Age of Onset  . Healthy Sister   . Lung disease Sister     has blebs on MRI,   . Breast cancer Maternal Aunt     diagnosed in her 47s  . Melanoma Maternal Uncle   . Breast cancer Maternal Aunt     diagnosed in her 68s  . Breast cancer Maternal Aunt     diagnosed in her 41s  . Breast cancer Mother   . Diabetes Mother   . Heart attack Father   . Diabetes Father   . Prostate cancer Father   . Healthy Brother   . Healthy Brother   . Cancer Cousin     multiple myeloma  . Colon cancer Neg Hx     Social History   Social History  . Marital status: Married    Spouse name: N/A  . Number of children: N/A  . Years of education: N/A   Social History Main Topics  . Smoking status: Former Smoker  Packs/day: 2.00    Years: 30.00    Types: Cigarettes    Quit date: 07/30/2012  . Smokeless tobacco: Never Used  . Alcohol use No  . Drug use: No  . Sexual activity: Not Currently   Other Topics Concern  . None   Social History Narrative  . None     Current Outpatient Prescriptions:  .  amLODipine (NORVASC) 10 MG tablet, Take 1 tablet (10 mg total) by mouth daily., Disp: 30 tablet, Rfl: 10 .  aspirin EC 81 MG tablet, Take 81 mg by mouth every morning. , Disp: , Rfl:  .  cetirizine (ZYRTEC) 10 MG tablet, Take 10 mg by mouth every morning. , Disp: , Rfl:  .  fluticasone (FLONASE) 50 MCG/ACT nasal spray, Place 2 sprays into the nose daily as needed. For allergies., Disp: 16 g, Rfl: 6 .  ibuprofen (ADVIL,MOTRIN) 200 MG tablet, You can take 2-3 of these every 6 hours as needed., Disp: 30 tablet, Rfl:  0 .  lisinopril (PRINIVIL,ZESTRIL) 40 MG tablet, Take 1 tablet (40 mg total) by mouth daily., Disp: 30 tablet, Rfl: 10 .  Multiple Vitamin (MULTIVITAMIN) tablet, Take 1 tablet by mouth daily., Disp: , Rfl:  .  benzonatate (TESSALON PERLES) 100 MG capsule, Take 1 capsule (100 mg total) by mouth 3 (three) times daily as needed., Disp: 20 capsule, Rfl: 0  Current Facility-Administered Medications:  .  0.9 %  sodium chloride infusion, 500 mL, Intravenous, Continuous, Jerene Bears, MD  EXAM:  Vitals:   05/08/16 1339 05/08/16 1343  BP: (!) 144/90 128/86  Pulse: 99   Temp: 98.7 F (37.1 C)     Body mass index is 31.02 kg/m.  GENERAL: vitals reviewed and listed above, alert, oriented, appears well hydrated and in no acute distress  HEENT: atraumatic, conjunttiva clear, no obvious abnormalities on inspection of external nose and ears, normal appearance of ear canals and TMs, clear nasal congestion, mild post oropharyngeal erythema with PND, no tonsillar edema or exudate, no sinus TTP  NECK: no obvious masses on inspection  LUNGS: decreased breath sounds R  CV: HRRR, no peripheral edema  MS: moves all extremities without noticeable abnormality  PSYCH: pleasant and cooperative, no obvious depression or anxiety  ASSESSMENT AND PLAN:  Discussed the following assessment and plan:  Cough - Plan: DG Chest 2 View  Respiratory illness  - We discussed potential etiologies, with VURI being likely and potential lung involvement vs underlying lung disease vs other. CXR to eval.  Tessalon for cough and other pending CXR. We discussed treatment side effects, likely course, antibiotic misuse, transmission, and signs of developing a serious illness. -follow up elevated BP once feeling better -of course, we advised to return or notify a doctor immediately if symptoms worsen or persist or new concerns arise.  Addendum: radiologist called with news of new R sided pneumothorax with recs to go to  hospital - assistant contacted pt to go to ER and assistant notifying hospital triage.   Patient Instructions  BEFORE YOU LEAVE: -xray sheet  Get chest xray  Tessalon as needed for cough as prescribed  I hope you are feeling better soon! Seek care immediately if worsening, new concerns or you are not improving with treatment.      Colin Benton R., DO

## 2016-05-08 NOTE — Progress Notes (Signed)
[address]       [place] [address]             [phone]        Lynk V Folden [facility] [facility] Medical Record #[id number] Date of Birth: 19-Mar-1955  Referring: No ref. provider found Primary Care: [name], MD  Chief Complaint:    Chief Complaint  Patient presents with  . collapsed lung    History of Present Illness:     Patient with previous history of left thoracotomy 23 years ago for PTX by Dr Cyndia Bent . Has been intermittent smoker , none for past 4 years. Sunday oted some sob and right chest pain. Called primary care this am , chest xray done this afternoon and patient set to [facility] .    Current Activity/ Functional Status: Patient is independent with mobility/ambulation, transfers, ADL's, IADL's.   Zubrod Score: At the time of surgery this patient's most appropriate activity status/level should be described as: []     0    Normal activity, no symptoms []     1    Restricted in physical strenuous activity but ambulatory, able to do out light work []     2    Ambulatory and capable of self care, unable to do work activities, up and about                 more than 50%  Of the time                            []     3    Only limited self care, in bed greater than 50% of waking hours []     4    Completely disabled, no self care, confined to bed or chair []     5    Moribund  Past Medical History:  Diagnosis Date  . 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 Allergic rhinitis   . Anginal pain ([place])   . Arthritis    "probably in my fingers" (04/08/2013)  . Basal cell carcinoma 2000's   'chest"  . Birt-Hogg-Dube syndrome   . Bradycardia   . Dyslipidemia (high LDL; low HDL)   . 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 History of blood transfusion ?1995  . Hx of colonic polyps   . Hyperlipidemia   . Hypertension   . Ileus, postoperative ([place]) 04/17/2013  . Spontaneous pneumothorax 1995    Past Surgical History:    Procedure Laterality Date  . APPENDECTOMY  04/07/2013  . APPENDECTOMY  04/07/2013   Procedure: OPEN APPENDECTOMY;  Surgeon: Gayland Curry, MD;  Location: [facility];  Service: General;;  . cardiolyte stress  04/2008  . CHOLECYSTECTOMY  2004  . COLONOSCOPY  06/2005  . ELBOW SURGERY Left 2001   "tendonitis"  . EXTERNAL EAR SURGERY  1971  . INCISIONAL HERNIA REPAIR N/A 05/12/2014   Procedure: OPEN REPAIR OF INCISIONAL HERNIA REPAIR;  Surgeon: Greer Pickerel, MD;  [facility];  Service: General;  Laterality: N/A;  . INSERTION OF MESH N/A 05/12/2014   Procedure: WITH INSERTION OF MESH;  Surgeon: Greer Pickerel, MD;  [facility];  Service: General;  Laterality: N/A;  . KNEE ARTHROSCOPY Left 1990's  . LAPAROSCOPIC APPENDECTOMY N/A 04/07/2013   Procedure: ATTEMPTED APPENDECTOMY LAPAROSCOPIC;  Surgeon: Gayland Curry, MD;  Location: [facility];  Service:  General;  Laterality: N/A;  . LUNG SURGERY     "repaired ruptured bleb" '95  . MIDDLE EAR SURGERY Left 1972   exploratory tympanotomy/notes 07/17/2000  . NASAL SEPTUM SURGERY    . PARTIAL COLECTOMY  04/07/2013   Procedure: PARTIAL COLECTOMY, ILEOCECTOMY;  Surgeon: Gayland Curry, MD;  Location: [facility];  Service: General;;  . SKIN CANCER EXCISION  2000's   "chest"  . TENDON REPAIR Left 1990's   "thumb"    History  Smoking Status  . Former Smoker  . Packs/day: 2.00  . Years: 30.00  . Types: Cigarettes  . Quit date: 07/30/2012  Smokeless Tobacco  . Never Used   History  Alcohol Use No    Social History   Social History  . Marital status: Married    Spouse name: N/A  . Number of children: N/A  . Years of education: N/A   Occupational History  . Not on file.   Social History Main Topics  . Smoking status: Former Smoker    Packs/day: 2.00    Years: 30.00    Types: Cigarettes    Quit date: 07/30/2012  . Smokeless tobacco: Never Used  . Alcohol use No  . Drug use: No  . Sexual activity: Not Currently   Other Topics Concern  . Not on  file   Social History Narrative  . No narrative on file    Allergies  Allergen Reactions  . Niacin     REACTION: rash    Current Facility-Administered Medications  Medication Dose Route Frequency Provider Last Rate Last Dose  . 0.9 %  sodium chloride infusion  500 mL Intravenous Continuous Jerene Bears, MD      . 0.9 %  sodium chloride infusion   Intravenous Continuous Lacretia Leigh, MD 10 mL/hr at 05/08/16 1840     Current Outpatient Prescriptions  Medication Sig Dispense Refill  . amLODipine (NORVASC) 10 MG tablet Take 1 tablet (10 mg total) by mouth daily. 30 tablet 10  . aspirin EC 81 MG tablet Take 81 mg by mouth every morning.     . benzonatate (TESSALON PERLES) 100 MG capsule Take 1 capsule (100 mg total) by mouth 3 (three) times daily as needed. 20 capsule 0  . cetirizine (ZYRTEC) 10 MG tablet Take 10 mg by mouth every morning.     . fluticasone (FLONASE) 50 MCG/ACT nasal spray Place 2 sprays into the nose daily as needed. For allergies. 16 g 6  . ibuprofen (ADVIL,MOTRIN) 200 MG tablet You can take 2-3 of these every 6 hours as needed. (Patient taking differently: Take 600 mg by mouth every 6 (six) hours as needed. You can take 2-3 of these every 6 hours as needed.) 30 tablet 0  . lisinopril (PRINIVIL,ZESTRIL) 40 MG tablet Take 1 tablet (40 mg total) by mouth daily. 30 tablet 10  . Multiple Vitamin (MULTIVITAMIN) tablet Take 1 tablet by mouth daily.    Marland Kitchen OVER THE COUNTER MEDICATION 1 tablet daily. "bio flex"    . Probiotic Product (PROBIOTIC PO) Take 1 capsule by mouth daily.       (Not in a hospital admission)  Family History  Problem Relation Age of Onset  . Healthy Sister   . Lung disease Sister     has blebs on MRI,   . Breast cancer Maternal Aunt     diagnosed in her 93s  . Melanoma Maternal Uncle   . Breast cancer Maternal Aunt     diagnosed in her  48s  . Breast cancer Maternal Aunt     diagnosed in her 31s  . Breast cancer Mother   . Diabetes Mother   .  Heart attack Father   . Diabetes Father   . Prostate cancer Father   . Healthy Brother   . Healthy Brother   . Cancer Cousin     multiple myeloma  . Colon cancer Neg Hx      Review of Systems:      Cardiac Review of Systems: Y or N  Chest Pain [ y   ]  Resting SOB [  y ] Exertional SOB  Blue.Reese  ]  Orthopnea [  ]   Pedal Edema [   ]    Palpitations [  ] Syncope  [  ]   Presyncope [   ]  General Review of Systems: [Y] = yes [  ]=no Constitional: recent weight change [  ]; anorexia [  ]; fatigue [  ]; nausea [  ]; night sweats [  ]; fever [  ]; or chills [  ]                                                               Dental: poor dentition[ n ]; Last Dentist visit:   Eye : blurred vision [  ]; diplopia [   ]; vision changes [  ];  Amaurosis fugax[  ]; Resp: cough [  ];  wheezing[  ];  hemoptysis[  ]; shortness of breath[  ]; paroxysmal nocturnal dyspnea[  ]; dyspnea on exertion[  ]; or orthopnea[  ];  GI:  gallstones[  ], vomiting[  ];  dysphagia[  ]; melena[  ];  hematochezia [  ]; heartburn[  ];   Hx of  Colonoscopy[  ]; GU: kidney stones [  ]; hematuria[  ];   dysuria [  ];  nocturia[  ];  history of     obstruction [  ]; urinary frequency [ n ]             Skin: rash, swelling[  ];, hair loss[  ];  peripheral edema[  ];  or itching[  ]; Musculosketetal: myalgias[  ];  joint swelling[  ];  joint erythema[  ];  joint pain[  ];  back pain[  ];  Heme/Lymph: bruising[  ];  bleeding[  ];  anemia[  ];  Neuro: TIA[  ];  headaches[  ];  stroke[  ];  vertigo[  ];  seizures[  ];   paresthesias[  ];  difficulty walking[n  ];  Psych:depression[  ]; anxiety[  ];  Endocrine: diabetes[  ];  thyroid dysfunction[  ];  Immunizations: Flu [  ]; Pneumococcal[  ];  Other:  Physical Exam: BP 137/89 (BP Location: Left Arm)   Pulse 82   Temp 98.7 F (37.1 C) (Oral)   Resp (!) 21   Ht 5' 10"  (1.778 m)   Wt 216 lb (98 kg)   SpO2 92%   BMI 30.99 kg/m    General appearance: alert, cooperative and  appears stated age Head: Normocephalic, without obvious abnormality, atraumatic Neck: no adenopathy, no carotid bruit, no JVD, supple, symmetrical, trachea midline and thyroid not enlarged, symmetric, no tenderness/mass/nodules Lymph nodes: Cervical, supraclavicular, and axillary nodes  normal. Resp: diminished breath sounds RLL and RML Back: negative, symmetric, no curvature. ROM normal. No CVA tenderness. Cardio: regular rate and rhythm, S1, S2 normal, no murmur, click, rub or gallop GI: soft, non-tender; bowel sounds normal; no masses,  no organomegaly Extremities: extremities normal, atraumatic, no cyanosis or edema and Homans sign is negative, no sign of DVT Neurologic: Grossly normal  Diagnostic Studies & Laboratory data:     Recent Radiology Findings:   Dg Chest 2 View  Result Date: 05/08/2016 CLINICAL DATA:  Shortness of breath, cough. EXAM: CHEST  2 VIEW COMPARISON:  Radiograph of April 07, 2013. FINDINGS: Stable cardiomediastinal silhouette. Stable left basilar scarring is noted. Atherosclerosis of thoracic aorta is noted. There is interval development of large right pneumothorax which is greater than 50%. Bony thorax is unremarkable. IMPRESSION: Large right pneumothorax. Critical Value/emergent results were called by telephone at the time of interpretation on 05/08/2016 at 4:45 pm to Dr. Colin Benton , who verbally acknowledged these results. Electronically Signed   By: Marijo Conception, M.D.   On: 05/08/2016 16:45     I have independently reviewed the above radiologic studies.  Recent Lab Findings: Lab Results  Component Value Date   WBC 4.0 05/08/2016   HGB 15.6 05/08/2016   HCT 44.8 05/08/2016   PLT 203 05/08/2016   GLUCOSE 128 (H) 05/08/2016   CHOL 159 09/13/2015   TRIG 115.0 09/13/2015   HDL 37.60 (L) 09/13/2015   LDLDIRECT 123.1 10/25/2006   LDLCALC 98 09/13/2015   ALT 24 09/13/2015   AST 16 09/13/2015   NA 140 05/08/2016   K 3.8 05/08/2016   CL 108 05/08/2016    CREATININE 1.00 05/08/2016   BUN 15 05/08/2016   CO2 24 05/08/2016   TSH 2.83 09/13/2015      Assessment / Plan:    Right 50 % PTX- first occurace on the right -  Recommend right chest tube- done continue -20 cm suction Admission, will need to transfer to [facility] at some point but not emergency Follow up ct of chest / blebs - ordered  Ct to -20 cm suction Pain meds as needed      I  spent 30 minutes counseling the patient face to face and 50% or more the  time was spent in counseling and coordination of care. The total time spent in the appointment was 55 minutes.    Grace Isaac MD      [address] [place] [address] Office [phone]   Murlean Hark [phone]  05/08/2016 8:59 PM     Patient ID: RYLIN SEAVEY, male   DOB: 26-Jan-1956, 61 y.o.   MRN: [id number]

## 2016-05-08 NOTE — ED Notes (Signed)
Pt resting calm denies any discomfort pt is breathing normal and unlabored. Pt wife is at bedside.

## 2016-05-08 NOTE — Procedures (Signed)
Chest Tube Insertion Procedure Note  Indications:  Clinically significant Pneumothorax  Pre-operative Diagnosis: Pneumothorax  Post-operative Diagnosis: Pneumothorax  Procedure Details  Informed consent was obtained for the procedure, including sedation.  Risks of lung perforation, hemorrhage, arrhythmia, and adverse drug reaction were discussed.   After sterile skin prep, using standard technique, a 8 French tube was placed in the right lateral 6 rib space.  Findings: Return of air  Estimated Blood Loss:  Minimal         Specimens:  None              Complications:  None; patient tolerated the procedure well.         Disposition: admission and then transfer to [facility] when bed          Condition: stable  Attending Attestation: I performed the procedure.

## 2016-05-08 NOTE — ED Triage Notes (Addendum)
Pt sent here for R collapsed lung.  Pt began to have a cough yesterday as his only symptom.  Hx of collapsed lung years ago that needed a chest tube. Pt speaking in full sentences at present.

## 2016-05-08 NOTE — ED Notes (Signed)
Dr Servando Snare at bedside to place drainage tube to rt chest, permit obtained, Time Out performed by team in room with MD, RN x 2.

## 2016-05-08 NOTE — ED Notes (Signed)
EDP notified of pts  Pain scale, awaiting orders. Comfort measures provided, pt repositioned for comfort, pt reassured, wife at side, all safety measures in place

## 2016-05-08 NOTE — Patient Instructions (Signed)
BEFORE YOU LEAVE: -xray sheet  Get chest xray  Tessalon as needed for cough as prescribed  I hope you are feeling better soon! Seek care immediately if worsening, new concerns or you are not improving with treatment.

## 2016-05-09 ENCOUNTER — Inpatient Hospital Stay (HOSPITAL_COMMUNITY): Payer: 59

## 2016-05-09 ENCOUNTER — Encounter (HOSPITAL_COMMUNITY): Payer: Self-pay

## 2016-05-09 ENCOUNTER — Other Ambulatory Visit (HOSPITAL_COMMUNITY): Payer: Self-pay

## 2016-05-09 LAB — URINALYSIS, ROUTINE W REFLEX MICROSCOPIC
Bilirubin Urine: NEGATIVE
GLUCOSE, UA: NEGATIVE mg/dL
Hgb urine dipstick: NEGATIVE
Ketones, ur: NEGATIVE mg/dL
LEUKOCYTES UA: NEGATIVE
Nitrite: NEGATIVE
PH: 5 (ref 5.0–8.0)
Protein, ur: NEGATIVE mg/dL
Specific Gravity, Urine: 1.02 (ref 1.005–1.030)

## 2016-05-09 MED ORDER — AZITHROMYCIN 500 MG PO TABS
500.0000 mg | ORAL_TABLET | Freq: Every day | ORAL | Status: DC
  Administered 2016-05-09 – 2016-05-12 (×4): 500 mg via ORAL
  Filled 2016-05-09 (×5): qty 1

## 2016-05-09 MED ORDER — SODIUM CHLORIDE 0.9 % IV SOLN
3.0000 g | Freq: Three times a day (TID) | INTRAVENOUS | Status: DC
  Administered 2016-05-09 – 2016-05-11 (×6): 3 g via INTRAVENOUS
  Filled 2016-05-09 (×8): qty 3

## 2016-05-09 NOTE — ED Notes (Signed)
Attempted to give RN report @  [facility], RN unable to come to phone at this time.

## 2016-05-09 NOTE — Progress Notes (Signed)
PROGRESS NOTE                                                                                                                                                                                                             Patient Demographics:    Ernest Hudson, is a 61 y.o. male, DOB - 1955-03-18, AOZ:[id number]  Admit date - 05/08/2016   Admitting Physician Etta Quill, DO  Outpatient Primary MD for the patient is [name], MD  LOS - 1  Chief Complaint  Patient presents with  . collapsed lung       Brief Narrative   Ernest Hudson is a 61 y.o. 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  Patient presents to the ED from his PCPs office with c/o SOB, Workup showed right-sided spontaneous pneumothorax requiring chest replacement by cardiothoracic surgery. He also had signs of pneumonia on CT scan along with symptoms of the same.   Subjective:    Ernest Hudson today has, No headache, No chest pain, No abdominal pain - No Nausea, No new weakness tingling or numbness, No Cough - +ve SOB.     Assessment  & Plan :    1.Right-sided spontaneous pneumothorax in a patient with history of Birt-Hogg-Dube syndrome, prior left-sided pneumothorax requiring chest tube along with multiple lung cysts. Predisposed to developing pneumothorax due to the underlying pathology, has received right-sided chest tube on 05/08/2016 placed by cardiothoracic surgery, he still has air leak with cough, continue chest tube, management deferred to cardiac thoracic surgery.  2. Right middle lobe infiltrate on CT chest with fevers on 05/09/2016. We will treat this as coming pretty acquired pneumonia. No signs of aspiration. Has happened within 48 hours of admission. Blood sputum cultures, added incentive spirometry, place on Unasyn along with azithromycin and monitor.  3.  Essential hypertension. Continue combination of ACE inhibitor and Norvasc and monitor blood pressures.    Diet : Diet regular Room service appropriate? Yes; Fluid consistency: Thin    Family Communication  :  None  Code Status :  Full  Disposition Plan  :  TBD  Consults  :  CTS  Procedures  :    R.Chest tube - CTS - 05-08-16  DVT Prophylaxis  :  Lovenox    Lab Results  Component Value Date   PLT 203 05/08/2016    Inpatient Medications  Scheduled Meds: . amLODipine  10 mg Oral Daily  . aspirin EC  81 mg Oral q morning - 10a  . enoxaparin (LOVENOX) injection  40 mg Subcutaneous Daily  . fluticasone  2 spray Each Nare Daily  . lisinopril  40 mg Oral Daily  . loratadine  10 mg Oral Daily  . sodium chloride flush  3 mL Intravenous Q12H   Continuous Infusions: . sodium chloride 20 mL/hr at 05/09/16 0159   PRN Meds:.benzonatate, ibuprofen, morphine injection  Antibiotics  :    Anti-infectives    None         Objective:   Vitals:   05/09/16 0100 05/09/16 0153 05/09/16 0643 05/09/16 1341  BP: (!) 126/94 (!) 145/86 (!) 143/83 121/72  Pulse: 86 86 86 86  Resp: 16 18 18 18   Temp: 98.8 F (37.1 C) 98 F (36.7 C) 99 F (37.2 C) (!) 101.5 F (38.6 C)  TempSrc:  Oral Oral Oral  SpO2: 95% 98% 100% 100%  Weight:  96 kg (211 lb 11.2 oz)    Height:  5\' 10"  (1.778 m)      Wt Readings from Last 3 Encounters:  05/09/16 96 kg (211 lb 11.2 oz)  05/08/16 98.1 kg (216 lb 3.2 oz)  03/06/16 99.8 kg (220 lb)     Intake/Output Summary (Last 24 hours) at 05/09/16 1342 Last data filed at 05/09/16 1300  Gross per 24 hour  Intake          1020.34 ml  Output             1110 ml  Net           -89.66 ml     Physical Exam  Awake Alert, Oriented X 3, No new F.N deficits, Normal affect [place].AT,PERRAL Supple Neck,No JVD, No cervical lymphadenopathy appriciated.  Symmetrical Chest wall movement, Good air movement bilaterally, Right-sided chest tube, few crackles in the  right base. RRR,No Gallops,Rubs or new Murmurs, No Parasternal Heave +ve B.Sounds, Abd Soft, No tenderness, No organomegaly appriciated, No rebound - guarding or rigidity. No Cyanosis, Clubbing or edema, No new Rash or bruise      Data Review:    CBC  Recent Labs Lab 05/08/16 1845  WBC 4.0  HGB 15.6  HCT 44.8  PLT 203  MCV 83.7  MCH 29.2  MCHC 34.8  RDW 13.3  LYMPHSABS 1.2  MONOABS 0.6  EOSABS 0.3  BASOSABS 0.0    Chemistries   Recent Labs Lab 05/08/16 1845  NA 140  K 3.8  CL 108  CO2 24  GLUCOSE 128*  BUN 15  CREATININE 1.00  CALCIUM 9.4   ------------------------------------------------------------------------------------------------------------------ No results for input(s): CHOL, HDL, LDLCALC, TRIG, CHOLHDL, LDLDIRECT in the last 72 hours.  No results found for: HGBA1C ------------------------------------------------------------------------------------------------------------------ No results for input(s): TSH, T4TOTAL, T3FREE, THYROIDAB in the last 72 hours.  Invalid input(s): FREET3 ------------------------------------------------------------------------------------------------------------------ No results for input(s): VITAMINB12, FOLATE, FERRITIN, TIBC, IRON, RETICCTPCT in the last 72 hours.  Coagulation profile No results for input(s): INR, PROTIME in the last 168 hours.  No results for input(s): DDIMER in the last 72 hours.  Cardiac Enzymes No results for input(s): CKMB, TROPONINI, MYOGLOBIN in the last 168 hours.  Invalid input(s): CK ------------------------------------------------------------------------------------------------------------------ No results found for: BNP  Micro Results No results found for this or any previous visit (from the past 240 hour(s)).  Radiology Reports Dg Chest 2 View  Result Date: 05/08/2016 CLINICAL DATA:  Shortness of breath, cough. EXAM: CHEST  2 VIEW COMPARISON:  Radiograph of April 07, 2013.  FINDINGS: Stable cardiomediastinal silhouette. Stable left basilar scarring is noted. Atherosclerosis of thoracic aorta is noted. There is interval development of large right pneumothorax which is greater than 50%. Bony thorax is unremarkable. IMPRESSION: Large right pneumothorax. Critical Value/emergent results were called by telephone at the time of interpretation on 05/08/2016 at 4:45 pm to Dr. Colin B[place] , who verbally acknowledged these results. Electronically Signed   By: Marijo Conception, M.D.   On: 05/08/2016 16:45   Ct Chest Wo Contrast  Result Date: 05/09/2016 CLINICAL DATA:  61 year old male inpatient with Birt-Hogg-Dube syndrome status post right chest tube placement for right pneumothorax, presents for evaluation of right chest tube position. EXAM: CT CHEST WITHOUT CONTRAST TECHNIQUE: Multidetector CT imaging of the chest was performed following the standard protocol without IV contrast. COMPARISON:  Chest radiograph from earlier today. 02/28/2005 chest CT. FINDINGS: Cardiovascular: Normal heart size. No significant pericardial fluid/thickening. Left main, left anterior descending and left circumflex coronary atherosclerosis. Atherosclerotic nonaneurysmal thoracic aorta. Normal caliber pulmonary arteries. Mediastinum/Nodes: No discrete thyroid nodules. Unremarkable esophagus. No axillary adenopathy. Mild right paratracheal adenopathy measuring up to 1.1 cm (series 3/ image 61), new since 2007. Newly mildly enlarged 1.0 cm subcarinal node (series 3/ image 71). No additional pathologically enlarged mediastinal or gross hilar lymph nodes on this noncontrast scan. Lungs/Pleura: There is a small (approximately 10%) right pneumothorax, predominantly in the anterior basilar right pleural space. Right lateral chest tube enters the pleural space in the right seventh intercostal space and terminates in the posterior peripheral right mid pleural space at the level of the main pulmonary artery. No left  pneumothorax. No pleural effusion. Suture line is noted in the apical left upper lobe. Numerous thin-walled air cysts are scattered throughout both lungs, mildly progressed since 2007, largest in the medial left upper lobe and central lower lobes. There is patchy tree-in-bud opacity with surrounding ground-glass opacity in the dependent right middle lobe, new. There is mild dependent atelectasis in both lower lobes. Otherwise no consolidative airspace disease or significant pulmonary nodules. Upper abdomen: Cholecystectomy. Left adrenal 2.9 cm mass is stable since 2007, compatible with a benign adenoma. Musculoskeletal:  No aggressive appearing focal osseous lesions. IMPRESSION: 1. Small right pneumothorax (approximately 10%). Right chest tube terminates posteriorly in the peripheral right mid pleural space. 2. Scattered thin-walled air cysts throughout both lungs, mildly progressed since 2007, compatible with Birt-Hogg-Dube syndrome. 3. New mild bronchopneumonia in the dependent right middle lobe. 4. Mild dependent atelectasis in the lower lobes. 5. New mild mediastinal lymphadenopathy, nonspecific, probably reactive. Consider a short-term follow-up chest CT with IV contrast in 3 months. 6. Aortic atherosclerosis. Left main and two-vessel coronary atherosclerosis. 7. Stable left adrenal adenoma. Electronically Signed   By: Ilona Sorrel M.D.   On: 05/09/2016 10:54   Dg Chest Port 1 View  Result Date: 05/09/2016 CLINICAL DATA:  Pneumothorax. EXAM: PORTABLE CHEST 1 VIEW COMPARISON:  Radiograph of May 08, 2016. FINDINGS: Stable cardiomediastinal silhouette. Stable position of right-sided chest tube. Mild right pneumothorax is noted laterally which is increased in size compared to prior exam. Postsurgical changes are seen in the left lung apex. Mild left basilar subsegmental atelectasis is noted. Bony thorax is unremarkable. IMPRESSION: Mild left basilar subsegmental atelectasis. Stable position of right-sided  chest tube. Mild right pneumothorax is noted which is slightly increased in size compared to prior exam. Electronically Signed  By: Marijo Conception, M.D.   On: 05/09/2016 07:12   Dg Chest Port 1 View  Result Date: 05/08/2016 CLINICAL DATA:  Right chest tube insertion EXAM: PORTABLE CHEST 1 VIEW COMPARISON:  05/08/2016 FINDINGS: AP semi-erect view chest demonstrates insertion of a right lower chest drainage catheter with the tip overlying the lateral lower lung. There is decreased size of right pneumothorax with a small residual lateral pneumothorax noted. Decreased shift of the contents to the left. There is linear scarring in the left lower lung and right lung base. Stable heart size. IMPRESSION: Placement of a right lower chest drainage catheter with decreased right-sided pneumothorax. A small residual right lateral pneumothorax is visualized. Electronically Signed   By: Donavan Foil M.D.   On: 05/08/2016 22:44    Time Spent in minutes  30   Amarya Kuehl K M.D on 05/09/2016 at 1:42 PM  Between 7am to 7pm - Pager - [phone] ( page via [facility], text pages only, please mention full 10 digit call back number).  After 7pm go to [email or web address] - password [facility]  [facility] -  Office  [phone]

## 2016-05-09 NOTE — Consult Note (Signed)
   [facility] CM Inpatient Consult   05/09/2016  AGNES PROBERT 1955-11-23 [id number]    Came to visit Mr. Vickki Muff at bedside to discuss Link to [employer] for [employer] with [employer]. His wife is a Furniture conservator/restorer. Denies having any Link to Wellness needs at this time. However, he is agreeable to a post discharge telephone call. Confirmed best contact number as [phone]. Mr. Hettich states he usually doesn't answer the phone from an unknown number but caller could leave a voicemail and he will return the call. Provided Link to [employer] and contact information. Appreciative of visit.    Marthenia Rolling, MSN-Ed, RN,BSN [facility] Liaison [phone]

## 2016-05-09 NOTE — Progress Notes (Signed)
ANTIBIOTIC CONSULT NOTE - INITIAL  Pharmacy Consult for Unasyn Indication: aspiration PNA   Allergies  Allergen Reactions  . Niacin     REACTION: rash    Patient Measurements: Height: 5\' 10"  (177.8 cm) Weight: 211 lb 11.2 oz (96 kg) IBW/kg (Calculated) : 73 Adjusted Body Weight:   Vital Signs: Temp: 101.5 F (38.6 C) (03/20 1341) Temp Source: Oral (03/20 1341) BP: 121/72 (03/20 1341) Pulse Rate: 86 (03/20 1341) Intake/Output from previous day: 03/19 0701 - 03/20 0700 In: 340.3 [P.O.:240; I.V.:100.3] Out: 160 [Urine:150; Chest Tube:10] Intake/Output from this shift: Total I/O In: 680 [P.O.:600; I.V.:80] Out: 950 [Urine:950]  Labs:  Recent Labs  05/08/16 1845  WBC 4.0  HGB 15.6  PLT 203  CREATININE 1.00   Estimated Creatinine Clearance: 90.2 mL/min (by C-G formula based on SCr of 1 mg/dL). No results for input(s): VANCOTROUGH, VANCOPEAK, VANCORANDOM, GENTTROUGH, GENTPEAK, GENTRANDOM, TOBRATROUGH, TOBRAPEAK, TOBRARND, AMIKACINPEAK, AMIKACINTROU, AMIKACIN in the last 72 hours.   Microbiology: No results found for this or any previous visit (from the past 720 hour(s)).  Medical History: Past Medical History:  Diagnosis Date  . Adrenal mass ([place])    "benign" (04/08/2013  . Allergic rhinitis   . Anginal pain ([place])   . Arthritis    "probably in my fingers" (04/08/2013)  . Basal cell carcinoma 2000's   'chest"  . Birt-Hogg-Dube syndrome   . Bradycardia   . Dyslipidemia (high LDL; low HDL)   . 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 History of blood transfusion ?1995  . Hx of colonic polyps   . Hyperlipidemia   . Hypertension   . Ileus, postoperative ([place]) 04/17/2013  . Spontaneous pneumothorax 1995   Assessment: CC/HPI: PCP office>>ED c/o SOB, cough, and symptoms URI. CXR demonstrates a large R sided PTX. CT placed  PMH: Birt-Hogg-Dube syndrome, adrenal mass, allergic rhinitis, arthritis, basal cell carcinoma, bradycardia, HLD, L ear HOH,  colon polyps, HLD, HTN, h/o spontaneous PTX  ID: Aspiration PNA. Tmax 101.5. WBC 4. Right middle lobe infiltrate on CT chest with fevers on 05/09/2016  Goal of Therapy:  Eradication of infection   Plan:  Unasyn 3g IV q8hr Pharmacy will sign off. Please reconsult for further dosing assitance.    Takeira Yanes S. Alford Highland, PharmD, BCPS Clinical Staff Pharmacist Pager [phone]  Eilene Ghazi Stillinger 05/09/2016,2:08 PM

## 2016-05-09 NOTE — Progress Notes (Addendum)
      [address]       [place] [address]             [phone]           Subjective: Patient had a lot of pain at chest tube site, but less so this am  Objective: Vital signs in last 24 hours: Temp:  [98 F (36.7 C)-99 F (37.2 C)] 99 F (37.2 C) (03/20 0643) Pulse Rate:  [65-99] 86 (03/20 0643) Cardiac Rhythm: Normal sinus rhythm (03/20 0143) Resp:  [12-24] 18 (03/20 0643) BP: (107-179)/(78-106) 143/83 (03/20 0643) SpO2:  [87 %-100 %] 100 % (03/20 0643) Weight:  [96 kg (211 lb 11.2 oz)-98.1 kg (216 lb 3.2 oz)] 96 kg (211 lb 11.2 oz) (03/20 0153)     Intake/Output from previous day: 03/19 0701 - 03/20 0700 In: 298.7 [P.O.:240; I.V.:58.7] Out: 160 [Urine:150; Chest Tube:10]   Physical Exam:  Cardiovascular: RRR Pulmonary: Clear to auscultation bilaterally; no rales, wheezes, or rhonchi. Chest Tube: chest tube is to suction, air leak with cough  Lab Results: CBC: Recent Labs  05/08/16 1845  WBC 4.0  HGB 15.6  HCT 44.8  PLT 203   BMET:  Recent Labs  05/08/16 1845  NA 140  K 3.8  CL 108  CO2 24  GLUCOSE 128*  BUN 15  CREATININE 1.00  CALCIUM 9.4    PT/INR: No results for input(s): LABPROT, INR in the last 72 hours. ABG:  INR: Will add last result for INR, ABG once components are confirmed Will add last 4 CBG results once components are confirmed  Assessment/Plan:  1. CV - SR in the 80's. On Amlodipine 10 mg daily and Lisinopril 40 mg daily as taken prior to admission. 2.  Pulmonary - s/p right chest tube placement yesterday. Chest tube is to suction. There is an air leak with cough. CXR this am shows right pneumothorax. CT scan of chest ordered for today.Await results to see if right VATS is indicated.   ZIMMERMAN,DONIELLE MPA-C 05/09/2016,7:29 AM  Ct of chest just done, bilaterial blebs larger on the left, more mid and lower lung zone  Small air leak with cough, increase suction to -30 cm I have seen and examined  [name] and agree with the above assessment  and plan.  Grace Isaac MD Beeper [phone] Office [phone] 05/09/2016 10:28 AM

## 2016-05-10 ENCOUNTER — Inpatient Hospital Stay (HOSPITAL_COMMUNITY): Payer: 59

## 2016-05-10 LAB — BASIC METABOLIC PANEL
ANION GAP: 8 (ref 5–15)
BUN: 14 mg/dL (ref 6–20)
CHLORIDE: 103 mmol/L (ref 101–111)
CO2: 28 mmol/L (ref 22–32)
Calcium: 8.5 mg/dL — ABNORMAL LOW (ref 8.9–10.3)
Creatinine, Ser: 1.1 mg/dL (ref 0.61–1.24)
GFR calc non Af Amer: >60 mL/min (ref >60)
GLUCOSE: 110 mg/dL — AB (ref 65–99)
Potassium: 3.8 mmol/L (ref 3.5–5.1)
Sodium: 139 mmol/L (ref 135–145)

## 2016-05-10 LAB — EXPECTORATED SPUTUM ASSESSMENT W REFEX TO RESP CULTURE

## 2016-05-10 LAB — EXPECTORATED SPUTUM ASSESSMENT W GRAM STAIN, RFLX TO RESP C

## 2016-05-10 LAB — CBC
HCT: 42 % (ref 39.0–52.0)
HEMOGLOBIN: 14.1 g/dL (ref 13.0–17.0)
MCH: 29.3 pg (ref 26.0–34.0)
MCHC: 33.6 g/dL (ref 30.0–36.0)
MCV: 87.1 fL (ref 78.0–100.0)
Platelets: 161 10*3/uL (ref 150–400)
RBC: 4.82 MIL/uL (ref 4.22–5.81)
RDW: 13.7 % (ref 11.5–15.5)
WBC: 4.5 10*3/uL (ref 4.0–10.5)

## 2016-05-10 NOTE — Progress Notes (Signed)
PROGRESS NOTE                                                                                                                                                                                                             Patient Demographics:    Ernest Hudson, is a 61 y.o. male, DOB - July 24, 1955, ESP:[id number]  Admit date - 05/08/2016   Admitting Physician Etta Quill, DO  Outpatient Primary MD for the patient is [name], MD  LOS - 2  Chief Complaint  Patient presents with  . collapsed lung       Brief Narrative   Ernest Hudson is a 61 y.o. 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  Patient presents to the ED from his PCPs office with c/o SOB, Workup showed right-sided spontaneous pneumothorax requiring chest replacement by cardiothoracic surgery. He also had signs of pneumonia on CT scan along with symptoms of the same.   Subjective:    Ernest Hudson today has, No headache, No chest pain, No abdominal pain - No Nausea, No new weakness tingling or numbness, Mild cough and shortness of breath, better than before.     Assessment  & Plan :    1.Right-sided spontaneous pneumothorax in a patient with history of Birt-Hogg-Dube syndrome, prior left-sided pneumothorax requiring chest tube along with multiple lung cysts. Predisposed to developing pneumothorax due to the underlying pathology, has received right-sided chest tube on 05/08/2016 placed by cardiothoracic surgery, His air leak seems to have resolved, continue chest tube, management deferred to cardio thoracic surgery.  2. Right middle lobe infiltrate on CT chest with fevers on 05/09/2016. We will treat this as coming pretty acquired pneumonia. No signs of aspiration. Has happened within 48 hours of admission. Blood sputum cultures, added incentive spirometry, place on Unasyn  along with azithromycin and monitor.  3. Essential hypertension. Continue combination of ACE inhibitor and Norvasc and monitor blood pressures.    Diet : Diet regular Room service appropriate? Yes; Fluid consistency: Thin    Family Communication  :  Wife bedside  Code Status :  Full  Disposition Plan  :  TBD  Consults  :  CTS  Procedures  :    R.Chest tube - CTS - 05-08-16  DVT Prophylaxis  :  Lovenox  Lab Results  Component Value Date   PLT 161 05/10/2016    Inpatient Medications  Scheduled Meds: . amLODipine  10 mg Oral Daily  . ampicillin-sulbactam (UNASYN) IV  3 g Intravenous Q8H  . aspirin EC  81 mg Oral q morning - 10a  . azithromycin  500 mg Oral Daily  . enoxaparin (LOVENOX) injection  40 mg Subcutaneous Daily  . fluticasone  2 spray Each Nare Daily  . lisinopril  40 mg Oral Daily  . loratadine  10 mg Oral Daily  . sodium chloride flush  3 mL Intravenous Q12H   Continuous Infusions: . sodium chloride 20 mL/hr at 05/09/16 0159   PRN Meds:.benzonatate, ibuprofen, morphine injection  Antibiotics  :    Anti-infectives    Start     Dose/Rate Route Frequency Ordered Stop   05/09/16 1500  azithromycin (ZITHROMAX) tablet 500 mg     500 mg Oral Daily 05/09/16 1345     05/09/16 1500  Ampicillin-Sulbactam (UNASYN) 3 g in sodium chloride 0.9 % 100 mL IVPB     3 g 200 mL/hr over 30 Minutes Intravenous Every 8 hours 05/09/16 1409           Objective:   Vitals:   05/09/16 1700 05/09/16 2033 05/10/16 0324 05/10/16 1037  BP:  116/72 (!) 148/73 117/74  Pulse:  71 94   Resp:  20 18   Temp: 98.2 F (36.8 C) 98.4 F (36.9 C) 99.7 F (37.6 C)   TempSrc: Oral Oral Oral   SpO2:  96% 95%   Weight:      Height:        Wt Readings from Last 3 Encounters:  05/09/16 96 kg (211 lb 11.2 oz)  05/08/16 98.1 kg (216 lb 3.2 oz)  03/06/16 99.8 kg (220 lb)     Intake/Output Summary (Last 24 hours) at 05/10/16 1110 Last data filed at 05/10/16 0800  Gross per 24  hour  Intake             1620 ml  Output             1159 ml  Net              461 ml     Physical Exam  Awake Alert, Oriented X 3, No new F.N deficits, Normal affect [place].AT,PERRAL Supple Neck,No JVD, No cervical lymphadenopathy appriciated.  Symmetrical Chest wall movement, Good air movement bilaterally, Right-sided chest tube, few crackles in the right base. RRR,No Gallops,Rubs or new Murmurs, No Parasternal Heave +ve B.Sounds, Abd Soft, No tenderness, No organomegaly appriciated, No rebound - guarding or rigidity. No Cyanosis, Clubbing or edema, No new Rash or bruise      Data Review:    CBC  Recent Labs Lab 05/08/16 1845 05/10/16 0250  WBC 4.0 4.5  HGB 15.6 14.1  HCT 44.8 42.0  PLT 203 161  MCV 83.7 87.1  MCH 29.2 29.3  MCHC 34.8 33.6  RDW 13.3 13.7  LYMPHSABS 1.2  --   MONOABS 0.6  --   EOSABS 0.3  --   BASOSABS 0.0  --     Chemistries   Recent Labs Lab 05/08/16 1845 05/10/16 0250  NA 140 139  K 3.8 3.8  CL 108 103  CO2 24 28  GLUCOSE 128* 110*  BUN 15 14  CREATININE 1.00 1.10  CALCIUM 9.4 8.5*   ------------------------------------------------------------------------------------------------------------------ No results for input(s): CHOL, HDL, LDLCALC, TRIG, CHOLHDL, LDLDIRECT in the last 72 hours.  No  results found for: HGBA1C ------------------------------------------------------------------------------------------------------------------ No results for input(s): TSH, T4TOTAL, T3FREE, THYROIDAB in the last 72 hours.  Invalid input(s): FREET3 ------------------------------------------------------------------------------------------------------------------ No results for input(s): VITAMINB12, FOLATE, FERRITIN, TIBC, IRON, RETICCTPCT in the last 72 hours.  Coagulation profile No results for input(s): INR, PROTIME in the last 168 hours.  No results for input(s): DDIMER in the last 72 hours.  Cardiac Enzymes No results for input(s): CKMB,  TROPONINI, MYOGLOBIN in the last 168 hours.  Invalid input(s): CK ------------------------------------------------------------------------------------------------------------------ No results found for: BNP  Micro Results Recent Results (from the past 240 hour(s))  Culture, expectorated sputum-assessment     Status: None   Collection Time: 05/09/16  1:45 PM  Result Value Ref Range Status   Specimen Description EXPECTORATED SPUTUM  Final   Special Requests NONE  Final   Sputum evaluation THIS SPECIMEN IS ACCEPTABLE FOR SPUTUM CULTURE  Final   Report Status 05/10/2016 FINAL  Final  Culture, respiratory (NON-Expectorated)     Status: None (Preliminary result)   Collection Time: 05/09/16  1:45 PM  Result Value Ref Range Status   Specimen Description EXPECTORATED SPUTUM  Final   Special Requests NONE Reflexed from [id number]  Final   Gram Stain   Final    ABUNDANT WBC PRESENT, PREDOMINANTLY PMN ABUNDANT GRAM NEGATIVE RODS FEW GRAM POSITIVE RODS MODERATE GRAM POSITIVE COCCI IN PAIRS RARE GRAM NEGATIVE COCCI IN PAIRS    Culture TOO YOUNG TO READ  Final   Report Status PENDING  Incomplete  Culture, blood (routine x 2)     Status: None (Preliminary result)   Collection Time: 05/09/16  2:23 PM  Result Value Ref Range Status   Specimen Description BLOOD LEFT HAND  Final   Special Requests IN PEDIATRIC BOTTLE 3CC  Final   Culture NO GROWTH < 24 HOURS  Final   Report Status PENDING  Incomplete  Culture, blood (routine x 2)     Status: None (Preliminary result)   Collection Time: 05/09/16  2:30 PM  Result Value Ref Range Status   Specimen Description BLOOD LEFT ARM  Final   Special Requests IN PEDIATRIC BOTTLE 3CC  Final   Culture NO GROWTH < 24 HOURS  Final   Report Status PENDING  Incomplete    Radiology Reports Dg Chest 2 View  Result Date: 05/08/2016 CLINICAL DATA:  Shortness of breath, cough. EXAM: CHEST  2 VIEW COMPARISON:  Radiograph of April 07, 2013. FINDINGS: Stable  cardiomediastinal silhouette. Stable left basilar scarring is noted. Atherosclerosis of thoracic aorta is noted. There is interval development of large right pneumothorax which is greater than 50%. Bony thorax is unremarkable. IMPRESSION: Large right pneumothorax. Critical Value/emergent results were called by telephone at the time of interpretation on 05/08/2016 at 4:45 pm to Dr. Colin Benton , who verbally acknowledged these results. Electronically Signed   By: Marijo Conception, M.D.   On: 05/08/2016 16:45   Ct Chest Wo Contrast  Result Date: 05/09/2016 CLINICAL DATA:  61 year old male inpatient with Birt-Hogg-Dube syndrome status post right chest tube placement for right pneumothorax, presents for evaluation of right chest tube position. EXAM: CT CHEST WITHOUT CONTRAST TECHNIQUE: Multidetector CT imaging of the chest was performed following the standard protocol without IV contrast. COMPARISON:  Chest radiograph from earlier today. 02/28/2005 chest CT. FINDINGS: Cardiovascular: Normal heart size. No significant pericardial fluid/thickening. Left main, left anterior descending and left circumflex coronary atherosclerosis. Atherosclerotic nonaneurysmal thoracic aorta. Normal caliber pulmonary arteries. Mediastinum/Nodes: No discrete thyroid nodules. Unremarkable esophagus. No axillary adenopathy. Mild  right paratracheal adenopathy measuring up to 1.1 cm (series 3/ image 61), new since 2007. Newly mildly enlarged 1.0 cm subcarinal node (series 3/ image 71). No additional pathologically enlarged mediastinal or gross hilar lymph nodes on this noncontrast scan. Lungs/Pleura: There is a small (approximately 10%) right pneumothorax, predominantly in the anterior basilar right pleural space. Right lateral chest tube enters the pleural space in the right seventh intercostal space and terminates in the posterior peripheral right mid pleural space at the level of the main pulmonary artery. No left pneumothorax. No pleural  effusion. Suture line is noted in the apical left upper lobe. Numerous thin-walled air cysts are scattered throughout both lungs, mildly progressed since 2007, largest in the medial left upper lobe and central lower lobes. There is patchy tree-in-bud opacity with surrounding ground-glass opacity in the dependent right middle lobe, new. There is mild dependent atelectasis in both lower lobes. Otherwise no consolidative airspace disease or significant pulmonary nodules. Upper abdomen: Cholecystectomy. Left adrenal 2.9 cm mass is stable since 2007, compatible with a benign adenoma. Musculoskeletal:  No aggressive appearing focal osseous lesions. IMPRESSION: 1. Small right pneumothorax (approximately 10%). Right chest tube terminates posteriorly in the peripheral right mid pleural space. 2. Scattered thin-walled air cysts throughout both lungs, mildly progressed since 2007, compatible with Birt-Hogg-Dube syndrome. 3. New mild bronchopneumonia in the dependent right middle lobe. 4. Mild dependent atelectasis in the lower lobes. 5. New mild mediastinal lymphadenopathy, nonspecific, probably reactive. Consider a short-term follow-up chest CT with IV contrast in 3 months. 6. Aortic atherosclerosis. Left main and two-vessel coronary atherosclerosis. 7. Stable left adrenal adenoma. Electronically Signed   By: Ilona Sorrel M.D.   On: 05/09/2016 10:54   Dg Chest Port 1 View  Result Date: 05/10/2016 CLINICAL DATA:  Pneumothorax, chest tube. EXAM: PORTABLE CHEST 1 VIEW COMPARISON:  05/09/2016 FINDINGS: Small for right chest tube noted. Decreasing size of the right pneumothorax with small residual lateral right pneumothorax. Left base atelectasis. Heart is normal size. IMPRESSION: Small right chest tube remains in stable position with decreasing size of the right pneumothorax, now 5-10%. Electronically Signed   By: Rolm Baptise M.D.   On: 05/10/2016 08:48   Dg Chest Port 1 View  Result Date: 05/09/2016 CLINICAL DATA:   Pneumothorax. EXAM: PORTABLE CHEST 1 VIEW COMPARISON:  Radiograph of May 08, 2016. FINDINGS: Stable cardiomediastinal silhouette. Stable position of right-sided chest tube. Mild right pneumothorax is noted laterally which is increased in size compared to prior exam. Postsurgical changes are seen in the left lung apex. Mild left basilar subsegmental atelectasis is noted. Bony thorax is unremarkable. IMPRESSION: Mild left basilar subsegmental atelectasis. Stable position of right-sided chest tube. Mild right pneumothorax is noted which is slightly increased in size compared to prior exam. Electronically Signed   By: Marijo Conception, M.D.   On: 05/09/2016 07:12   Dg Chest Port 1 View  Result Date: 05/08/2016 CLINICAL DATA:  Right chest tube insertion EXAM: PORTABLE CHEST 1 VIEW COMPARISON:  05/08/2016 FINDINGS: AP semi-erect view chest demonstrates insertion of a right lower chest drainage catheter with the tip overlying the lateral lower lung. There is decreased size of right pneumothorax with a small residual lateral pneumothorax noted. Decreased shift of the contents to the left. There is linear scarring in the left lower lung and right lung base. Stable heart size. IMPRESSION: Placement of a right lower chest drainage catheter with decreased right-sided pneumothorax. A small residual right lateral pneumothorax is visualized. Electronically Signed   By:  Donavan Foil M.D.   On: 05/08/2016 22:44    Time Spent in minutes  30   SINGH,PRASHANT K M.D on 05/10/2016 at 11:10 AM  Between 7am to 7pm - Pager - [phone] ( page via [facility], text pages only, please mention full 10 digit call back number).  After 7pm go to [email or web address] - password [facility]  [facility] -  Office  [phone]

## 2016-05-10 NOTE — Progress Notes (Addendum)
[address]       [place] [address]             [phone]      Subjective:   Complains of headache.  States pain at chest tube site has mostly resolved.  Objective: Vital signs in last 24 hours: Temp:  [98.2 F (36.8 C)-101.5 F (38.6 C)] 99.7 F (37.6 C) (03/21 0324) Pulse Rate:  [71-94] 94 (03/21 0324) Cardiac Rhythm: Normal sinus rhythm (03/20 1945) Resp:  [18-20] 18 (03/21 0324) BP: (116-148)/(72-73) 148/73 (03/21 0324) SpO2:  [95 %-100 %] 95 % (03/21 0324)  Intake/Output from previous day: 03/20 0701 - 03/21 0700 In: 1700 [P.O.:1320; I.V.:80; IV Piggyback:300] Out: 0973 [Urine:1450; Chest Tube:9]  General appearance: alert, cooperative and no distress Heart: regular rate and rhythm Lungs: clear to auscultation bilaterally Abdomen: soft, non-tender; bowel sounds normal; no masses,  no organomegaly Wound: clean and dry  Lab Results:  Recent Labs  05/08/16 1845 05/10/16 0250  WBC 4.0 4.5  HGB 15.6 14.1  HCT 44.8 42.0  PLT 203 161   BMET:  Recent Labs  05/08/16 1845 05/10/16 0250  NA 140 139  K 3.8 3.8  CL 108 103  CO2 24 28  GLUCOSE 128* 110*  BUN 15 14  CREATININE 1.00 1.10  CALCIUM 9.4 8.5*    PT/INR: No results for input(s): LABPROT, INR in the last 72 hours. ABG No results found for: PHART, HCO3, TCO2, ACIDBASEDEF, O2SAT CBG (last 3)  No results for input(s): GLUCAP in the last 72 hours.  Assessment/Plan:  1. Chest tube- no air leak on 30 cm of suction- will leave on suction today.. 2. Pulm- +bullous emphysema, CT scan reviewed, continue aggressive toilet 3. CV- NSR, BP remains controlled on Amlodipine and Lisionpril 4. Care per primary, CXR tomorrow   LOS: 2 days    Ellwood Handler 05/10/2016   Ct Chest Wo Contrast  Result Date: 05/09/2016 CLINICAL DATA:  61 year old male inpatient with Birt-Hogg-Dube syndrome status post right chest tube placement for right pneumothorax, presents for evaluation of right  chest tube position. EXAM: CT CHEST WITHOUT CONTRAST TECHNIQUE: Multidetector CT imaging of the chest was performed following the standard protocol without IV contrast. COMPARISON:  Chest radiograph from earlier today. 02/28/2005 chest CT. FINDINGS: Cardiovascular: Normal heart size. No significant pericardial fluid/thickening. Left main, left anterior descending and left circumflex coronary atherosclerosis. Atherosclerotic nonaneurysmal thoracic aorta. Normal caliber pulmonary arteries. Mediastinum/Nodes: No discrete thyroid nodules. Unremarkable esophagus. No axillary adenopathy. Mild right paratracheal adenopathy measuring up to 1.1 cm (series 3/ image 61), new since 2007. Newly mildly enlarged 1.0 cm subcarinal node (series 3/ image 71). No additional pathologically enlarged mediastinal or gross hilar lymph nodes on this noncontrast scan. Lungs/Pleura: There is a small (approximately 10%) right pneumothorax, predominantly in the anterior basilar right pleural space. Right lateral chest tube enters the pleural space in the right seventh intercostal space and terminates in the posterior peripheral right mid pleural space at the level of the main pulmonary artery. No left pneumothorax. No pleural effusion. Suture line is noted in the apical left upper lobe. Numerous thin-walled air cysts are scattered throughout both lungs, mildly progressed since 2007, largest in the medial left upper lobe and central lower lobes. There is patchy tree-in-bud opacity with surrounding ground-glass opacity in the dependent right middle lobe, new. There is mild dependent atelectasis in both lower lobes. Otherwise no consolidative airspace disease or significant pulmonary nodules. Upper abdomen: Cholecystectomy. Left  adrenal 2.9 cm mass is stable since 2007, compatible with a benign adenoma. Musculoskeletal:  No aggressive appearing focal osseous lesions. IMPRESSION: 1. Small right pneumothorax (approximately 10%). Right chest tube  terminates posteriorly in the peripheral right mid pleural space. 2. Scattered thin-walled air cysts throughout both lungs, mildly progressed since 2007, compatible with Birt-Hogg-Dube syndrome. 3. New mild bronchopneumonia in the dependent right middle lobe. 4. Mild dependent atelectasis in the lower lobes. 5. New mild mediastinal lymphadenopathy, nonspecific, probably reactive. Consider a short-term follow-up chest CT with IV contrast in 3 months. 6. Aortic atherosclerosis. Left main and two-vessel coronary atherosclerosis. 7. Stable left adrenal adenoma. Electronically Signed   By: Ilona Sorrel M.D.   On: 05/09/2016 10:54   Dg Chest Port 1 View  Result Date: 05/10/2016 CLINICAL DATA:  Pneumothorax, chest tube. EXAM: PORTABLE CHEST 1 VIEW COMPARISON:  05/09/2016 FINDINGS: Small for right chest tube noted. Decreasing size of the right pneumothorax with small residual lateral right pneumothorax. Left base atelectasis. Heart is normal size. IMPRESSION: Small right chest tube remains in stable position with decreasing size of the right pneumothorax, now 5-10%. Electronically Signed   By: Rolm Baptise M.D.   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  Grace Isaac MD Beeper [phone] Office [phone] 05/10/2016 10:31 AM

## 2016-05-11 ENCOUNTER — Inpatient Hospital Stay (HOSPITAL_COMMUNITY): Payer: 59

## 2016-05-11 LAB — BLOOD CULTURE ID PANEL (REFLEXED)
Acinetobacter baumannii: NOT DETECTED
CANDIDA KRUSEI: NOT DETECTED
Candida albicans: NOT DETECTED
Candida glabrata: NOT DETECTED
Candida parapsilosis: NOT DETECTED
Candida tropicalis: NOT DETECTED
ESCHERICHIA COLI: NOT DETECTED
Enterobacter cloacae complex: NOT DETECTED
Enterobacteriaceae species: NOT DETECTED
Enterococcus species: NOT DETECTED
Haemophilus influenzae: NOT DETECTED
Klebsiella oxytoca: NOT DETECTED
Klebsiella pneumoniae: NOT DETECTED
Listeria monocytogenes: NOT DETECTED
NEISSERIA MENINGITIDIS: NOT DETECTED
PROTEUS SPECIES: NOT DETECTED
PSEUDOMONAS AERUGINOSA: NOT DETECTED
SERRATIA MARCESCENS: NOT DETECTED
STAPHYLOCOCCUS AUREUS BCID: NOT DETECTED
STAPHYLOCOCCUS SPECIES: NOT DETECTED
STREPTOCOCCUS PNEUMONIAE: NOT DETECTED
Streptococcus agalactiae: NOT DETECTED
Streptococcus pyogenes: NOT DETECTED
Streptococcus species: NOT DETECTED

## 2016-05-11 LAB — CBC
HCT: 39.2 % (ref 39.0–52.0)
Hemoglobin: 13.4 g/dL (ref 13.0–17.0)
MCH: 29.6 pg (ref 26.0–34.0)
MCHC: 34.2 g/dL (ref 30.0–36.0)
MCV: 86.5 fL (ref 78.0–100.0)
PLATELETS: 142 10*3/uL — AB (ref 150–400)
RBC: 4.53 MIL/uL (ref 4.22–5.81)
RDW: 13.6 % (ref 11.5–15.5)
WBC: 3.5 10*3/uL — ABNORMAL LOW (ref 4.0–10.5)

## 2016-05-11 MED ORDER — AMOXICILLIN-POT CLAVULANATE 875-125 MG PO TABS
1.0000 | ORAL_TABLET | Freq: Two times a day (BID) | ORAL | Status: DC
  Administered 2016-05-11 – 2016-05-12 (×3): 1 via ORAL
  Filled 2016-05-11 (×3): qty 1

## 2016-05-11 NOTE — Progress Notes (Signed)
PROGRESS NOTE                                                                                                                                                                                                             Patient Demographics:    Ernest Hudson, is a 61 y.o. male, DOB - October 06, 1955, FUX:[id number]  Admit date - 05/08/2016   Admitting Physician Etta Quill, DO  Outpatient Primary MD for the patient is [name], MD  LOS - 3  Chief Complaint  Patient presents with  . collapsed lung       Brief Narrative   Ernest Hudson is a 61 y.o. 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  Patient presents to the ED from his PCPs office with c/o SOB, Workup showed right-sided spontaneous pneumothorax requiring chest replacement by cardiothoracic surgery. He also had signs of pneumonia on CT scan along with symptoms of the same.   Subjective:    [name] today has, No headache, No chest pain, No abdominal pain - No Nausea, No new weakness tingling or numbness, Mild cough and shortness of breath, better than before.     Assessment  & Plan :    1.Right-sided spontaneous pneumothorax in a patient with history of Birt-Hogg-Dube syndrome, prior left-sided pneumothorax requiring chest tube along with multiple lung cysts. Predisposed to developing pneumothorax due to the underlying pathology, has received right-sided chest tube on 05/08/2016 placed by cardiothoracic surgery, His air leak seems to have resolved, Chest x-ray appears stable we'll defer removal of chest tube to cardiothoracic surgery, if chest tube removed likely discharge on 05/12/2016.  2. Right middle lobe infiltrate on CT chest with fevers on 05/09/2016. Responded well to appropriate antibiotics, switched to oral regimen on 05/11/2016, continue incentive spirometry, overall  much better.  3. Essential hypertension. Continue combination of ACE inhibitor and Norvasc and monitor blood pressures.    Diet : Diet regular Room service appropriate? Yes; Fluid consistency: Thin    Family Communication  :  Wife bedside  Code Status :  Full  Disposition Plan  : Likely DC in am  Consults  :  CTS  Procedures  :    R.Chest tube - CTS - 05-08-16  DVT Prophylaxis  :  Lovenox  Lab Results  Component Value Date   PLT 142 (L) 05/11/2016    Inpatient Medications  Scheduled Meds: . amLODipine  10 mg Oral Daily  . ampicillin-sulbactam (UNASYN) IV  3 g Intravenous Q8H  . aspirin EC  81 mg Oral q morning - 10a  . azithromycin  500 mg Oral Daily  . enoxaparin (LOVENOX) injection  40 mg Subcutaneous Daily  . fluticasone  2 spray Each Nare Daily  . lisinopril  40 mg Oral Daily  . loratadine  10 mg Oral Daily  . sodium chloride flush  3 mL Intravenous Q12H   Continuous Infusions: . sodium chloride 20 mL/hr at 05/09/16 0159   PRN Meds:.benzonatate, ibuprofen, morphine injection  Antibiotics  :    Anti-infectives    Start     Dose/Rate Route Frequency Ordered Stop   05/09/16 1500  azithromycin (ZITHROMAX) tablet 500 mg     500 mg Oral Daily 05/09/16 1345     05/09/16 1500  Ampicillin-Sulbactam (UNASYN) 3 g in sodium chloride 0.9 % 100 mL IVPB     3 g 200 mL/hr over 30 Minutes Intravenous Every 8 hours 05/09/16 1409           Objective:   Vitals:   05/10/16 1037 05/10/16 1300 05/10/16 2158 05/11/16 0418  BP: 117/74 127/81 (!) 153/80 (!) 141/78  Pulse: 64 67 64 68  Resp:  20 20 20   Temp:  97.6 F (36.4 C) 97.8 F (36.6 C) 98 F (36.7 C)  TempSrc:  Oral Oral Oral  SpO2:  96% 94% 97%  Weight:      Height:        Wt Readings from Last 3 Encounters:  05/09/16 96 kg (211 lb 11.2 oz)  05/08/16 98.1 kg (216 lb 3.2 oz)  03/06/16 99.8 kg (220 lb)     Intake/Output Summary (Last 24 hours) at 05/11/16 0918 Last data filed at 05/11/16 0658  Gross  per 24 hour  Intake              400 ml  Output                0 ml  Net              400 ml     Physical Exam  Awake Alert, Oriented X 3, No new F.N deficits, Normal affect [place].AT,PERRAL Supple Neck,No JVD, No cervical lymphadenopathy appriciated.  Symmetrical Chest wall movement, Good air movement bilaterally, Right-sided chest tube, few crackles in the right base. RRR,No Gallops,Rubs or new Murmurs, No Parasternal Heave +ve B.Sounds, Abd Soft, No tenderness, No organomegaly appriciated, No rebound - guarding or rigidity. No Cyanosis, Clubbing or edema, No new Rash or bruise      Data Review:    CBC  Recent Labs Lab 05/08/16 1845 05/10/16 0250 05/11/16 0228  WBC 4.0 4.5 3.5*  HGB 15.6 14.1 13.4  HCT 44.8 42.0 39.2  PLT 203 161 142*  MCV 83.7 87.1 86.5  MCH 29.2 29.3 29.6  MCHC 34.8 33.6 34.2  RDW 13.3 13.7 13.6  LYMPHSABS 1.2  --   --   MONOABS 0.6  --   --   EOSABS 0.3  --   --   BASOSABS 0.0  --   --     Chemistries   Recent Labs Lab 05/08/16 1845 05/10/16 0250  NA 140 139  K 3.8 3.8  CL 108 103  CO2 24 28  GLUCOSE 128* 110*  BUN 15 14  CREATININE 1.00 1.10  CALCIUM 9.4 8.5*   ------------------------------------------------------------------------------------------------------------------ No results for input(s): CHOL, HDL, LDLCALC, TRIG, CHOLHDL, LDLDIRECT in the last 72 hours.  No results found for: HGBA1C ------------------------------------------------------------------------------------------------------------------ No results for input(s): TSH, T4TOTAL, T3FREE, THYROIDAB in the last 72 hours.  Invalid input(s): FREET3 ------------------------------------------------------------------------------------------------------------------ No results for input(s): VITAMINB12, FOLATE, FERRITIN, TIBC, IRON, RETICCTPCT in the last 72 hours.  Coagulation profile No results for input(s): INR, PROTIME in the last 168 hours.  No results for input(s):  DDIMER in the last 72 hours.  Cardiac Enzymes No results for input(s): CKMB, TROPONINI, MYOGLOBIN in the last 168 hours.  Invalid input(s): CK ------------------------------------------------------------------------------------------------------------------ No results found for: BNP  Micro Results Recent Results (from the past 240 hour(s))  Culture, expectorated sputum-assessment     Status: None   Collection Time: 05/09/16  1:45 PM  Result Value Ref Range Status   Specimen Description EXPECTORATED SPUTUM  Final   Special Requests NONE  Final   Sputum evaluation THIS SPECIMEN IS ACCEPTABLE FOR SPUTUM CULTURE  Final   Report Status 05/10/2016 FINAL  Final  Culture, respiratory (NON-Expectorated)     Status: None (Preliminary result)   Collection Time: 05/09/16  1:45 PM  Result Value Ref Range Status   Specimen Description EXPECTORATED SPUTUM  Final   Special Requests NONE Reflexed from [id number]  Final   Gram Stain   Final    ABUNDANT WBC PRESENT, PREDOMINANTLY PMN ABUNDANT GRAM NEGATIVE RODS FEW GRAM POSITIVE RODS MODERATE GRAM POSITIVE COCCI IN PAIRS RARE GRAM NEGATIVE COCCI IN PAIRS    Culture TOO YOUNG TO READ  Final   Report Status PENDING  Incomplete  Culture, blood (routine x 2)     Status: None (Preliminary result)   Collection Time: 05/09/16  2:23 PM  Result Value Ref Range Status   Specimen Description BLOOD LEFT HAND  Final   Special Requests IN PEDIATRIC BOTTLE 3CC  Final   Culture NO GROWTH < 24 HOURS  Final   Report Status PENDING  Incomplete  Culture, blood (routine x 2)     Status: None (Preliminary result)   Collection Time: 05/09/16  2:30 PM  Result Value Ref Range Status   Specimen Description BLOOD LEFT ARM  Final   Special Requests IN PEDIATRIC BOTTLE 3CC  Final   Culture NO GROWTH < 24 HOURS  Final   Report Status PENDING  Incomplete    Radiology Reports Dg Chest 2 View  Result Date: 05/08/2016 CLINICAL DATA:  Shortness of breath, cough. EXAM:  CHEST  2 VIEW COMPARISON:  Radiograph of April 07, 2013. FINDINGS: Stable cardiomediastinal silhouette. Stable left basilar scarring is noted. Atherosclerosis of thoracic aorta is noted. There is interval development of large right pneumothorax which is greater than 50%. Bony thorax is unremarkable. IMPRESSION: Large right pneumothorax. Critical Value/emergent results were called by telephone at the time of interpretation on 05/08/2016 at 4:45 pm to Dr. Colin Benton , who verbally acknowledged these results. Electronically Signed   By: Marijo Conception, M.D.   On: 05/08/2016 16:45   Ct Chest Wo Contrast  Result Date: 05/09/2016 CLINICAL DATA:  61 year old male inpatient with Birt-Hogg-Dube syndrome status post right chest tube placement for right pneumothorax, presents for evaluation of right chest tube position. EXAM: CT CHEST WITHOUT CONTRAST TECHNIQUE: Multidetector CT imaging of the chest was performed following the standard protocol without IV contrast. COMPARISON:  Chest radiograph from earlier today. 02/28/2005 chest CT. FINDINGS: Cardiovascular: Normal heart size. No significant pericardial fluid/thickening. Left main,  left anterior descending and left circumflex coronary atherosclerosis. Atherosclerotic nonaneurysmal thoracic aorta. Normal caliber pulmonary arteries. Mediastinum/Nodes: No discrete thyroid nodules. Unremarkable esophagus. No axillary adenopathy. Mild right paratracheal adenopathy measuring up to 1.1 cm (series 3/ image 61), new since 2007. Newly mildly enlarged 1.0 cm subcarinal node (series 3/ image 71). No additional pathologically enlarged mediastinal or gross hilar lymph nodes on this noncontrast scan. Lungs/Pleura: There is a small (approximately 10%) right pneumothorax, predominantly in the anterior basilar right pleural space. Right lateral chest tube enters the pleural space in the right seventh intercostal space and terminates in the posterior peripheral right mid pleural space  at the level of the main pulmonary artery. No left pneumothorax. No pleural effusion. Suture line is noted in the apical left upper lobe. Numerous thin-walled air cysts are scattered throughout both lungs, mildly progressed since 2007, largest in the medial left upper lobe and central lower lobes. There is patchy tree-in-bud opacity with surrounding ground-glass opacity in the dependent right middle lobe, new. There is mild dependent atelectasis in both lower lobes. Otherwise no consolidative airspace disease or significant pulmonary nodules. Upper abdomen: Cholecystectomy. Left adrenal 2.9 cm mass is stable since 2007, compatible with a benign adenoma. Musculoskeletal:  No aggressive appearing focal osseous lesions. IMPRESSION: 1. Small right pneumothorax (approximately 10%). Right chest tube terminates posteriorly in the peripheral right mid pleural space. 2. Scattered thin-walled air cysts throughout both lungs, mildly progressed since 2007, compatible with Birt-Hogg-Dube syndrome. 3. New mild bronchopneumonia in the dependent right middle lobe. 4. Mild dependent atelectasis in the lower lobes. 5. New mild mediastinal lymphadenopathy, nonspecific, probably reactive. Consider a short-term follow-up chest CT with IV contrast in 3 months. 6. Aortic atherosclerosis. Left main and two-vessel coronary atherosclerosis. 7. Stable left adrenal adenoma. Electronically Signed   By: Ilona Sorrel M.D.   On: 05/09/2016 10:54   Dg Chest Port 1 View  Result Date: 05/11/2016 CLINICAL DATA:  61 year old male with spontaneous right pneumothorax status post chest tube. Birt-Hogg-Dube syndrome EXAM: PORTABLE CHEST 1 VIEW COMPARISON:  05/10/2016 and earlier. FINDINGS: Portable AP upright view at 0643 hours. Small caliber right chest tube appears stable, tip projecting at the mid right lung level. Small right pneumothorax persists. Stable lung volumes. Stable cardiac size and mediastinal contours. No acute pulmonary opacity. No  acute osseous abnormality identified. IMPRESSION: 1. Stable small caliber right chest tube and small residual pneumothorax. 2. No new cardiopulmonary abnormality. Electronically Signed   By: Genevie Ann M.D.   On: 05/11/2016 07:35   Dg Chest Port 1 View  Result Date: 05/10/2016 CLINICAL DATA:  Pneumothorax, chest tube. EXAM: PORTABLE CHEST 1 VIEW COMPARISON:  05/09/2016 FINDINGS: Small for right chest tube noted. Decreasing size of the right pneumothorax with small residual lateral right pneumothorax. Left base atelectasis. Heart is normal size. IMPRESSION: Small right chest tube remains in stable position with decreasing size of the right pneumothorax, now 5-10%. Electronically Signed   By: Rolm Baptise M.D.   On: 05/10/2016 08:48   Dg Chest Port 1 View  Result Date: 05/09/2016 CLINICAL DATA:  Pneumothorax. EXAM: PORTABLE CHEST 1 VIEW COMPARISON:  Radiograph of May 08, 2016. FINDINGS: Stable cardiomediastinal silhouette. Stable position of right-sided chest tube. Mild right pneumothorax is noted laterally which is increased in size compared to prior exam. Postsurgical changes are seen in the left lung apex. Mild left basilar subsegmental atelectasis is noted. Bony thorax is unremarkable. IMPRESSION: Mild left basilar subsegmental atelectasis. Stable position of right-sided chest tube. Mild right pneumothorax  is noted which is slightly increased in size compared to prior exam. Electronically Signed   By: Marijo Conception, M.D.   On: 05/09/2016 07:12   Dg Chest Port 1 View  Result Date: 05/08/2016 CLINICAL DATA:  Right chest tube insertion EXAM: PORTABLE CHEST 1 VIEW COMPARISON:  05/08/2016 FINDINGS: AP semi-erect view chest demonstrates insertion of a right lower chest drainage catheter with the tip overlying the lateral lower lung. There is decreased size of right pneumothorax with a small residual lateral pneumothorax noted. Decreased shift of the contents to the left. There is linear scarring in the left  lower lung and right lung base. Stable heart size. IMPRESSION: Placement of a right lower chest drainage catheter with decreased right-sided pneumothorax. A small residual right lateral pneumothorax is visualized. Electronically Signed   By: Donavan Foil M.D.   On: 05/08/2016 22:44    Time Spent in minutes  30   SINGH,PRASHANT K M.D on 05/11/2016 at 9:18 AM  Between 7am to 7pm - Pager - [phone] ( page via [facility], text pages only, please mention full 10 digit call back number).  After 7pm go to [email or web address] - password [facility]  [facility] -  Office  [phone]

## 2016-05-11 NOTE — Progress Notes (Addendum)
      [address]       [place] [address]             [phone]      Subjective:  No specific complaints.  He is doing okay.  Hoping to get chest tube removed today.  Objective: Vital signs in last 24 hours: Temp:  [97.6 F (36.4 C)-98 F (36.7 C)] 98 F (36.7 C) (03/22 0418) Pulse Rate:  [64-68] 68 (03/22 0418) Cardiac Rhythm: Normal sinus rhythm (03/21 1900) Resp:  [20] 20 (03/22 0418) BP: (117-153)/(74-81) 141/78 (03/22 0418) SpO2:  [94 %-97 %] 97 % (03/22 0418)  Intake/Output from previous day: 03/21 0701 - 03/22 0700 In: 640 [P.O.:640] Out: 0   General appearance: alert, cooperative and no distress Heart: regular rate and rhythm Lungs: clear to auscultation bilaterally Abdomen: soft, non-tender; bowel sounds normal; no masses,  no organomegaly Wound: clean and dry  Lab Results:  Recent Labs  05/10/16 0250 05/11/16 0228  WBC 4.5 3.5*  HGB 14.1 13.4  HCT 42.0 39.2  PLT 161 142*   BMET:  Recent Labs  05/08/16 1845 05/10/16 0250  NA 140 139  K 3.8 3.8  CL 108 103  CO2 24 28  GLUCOSE 128* 110*  BUN 15 14  CREATININE 1.00 1.10  CALCIUM 9.4 8.5*   Assessment/Plan:  1. Chest tube- no air leak on water seal, CXR remains stable to slightly improved- will discuss chest tube removal with Dr. Servando Snare 2. Pulm- bullous emphysema, continue pulm toilet, on ABX for pneumonia 3. CV- NSR, BP elevated in 140s- will continue Lisinopril and Amlodipine 4. Dispo- patient stable, likely d/c chest tube today, repeat CXR in AM... Care per primary    LOS: 3 days    BARRETT, ERIN 05/11/2016  Remove chest tube today I have seen and examined [name] and agree with the above assessment  and plan.  Grace Isaac MD [facility] [phone] Office [phone] 05/11/2016 8:23 AM

## 2016-05-11 NOTE — Progress Notes (Signed)
Chest tube removed, as ordered by Dr Servando Snare, by Charge nurse Vincente Liberty with patient's RN assistance. Patient tolerated removal of chest tube with no complaints. He complains of no shortness of breath. Vaseline gauze dressing and 4x4 gauze placed over chest tube removal site and dressing is clean, dry, and intact post removal.

## 2016-05-11 NOTE — Progress Notes (Signed)
  PHARMACY - PHYSICIAN COMMUNICATION CRITICAL VALUE ALERT - BLOOD CULTURE IDENTIFICATION (BCID)  Results for orders placed or performed during the hospital encounter of 05/08/16  Blood Culture ID Panel (Reflexed) (Collected: 05/09/2016  2:23 PM)  Result Value Ref Range   Enterococcus species NOT DETECTED NOT DETECTED   Listeria monocytogenes NOT DETECTED NOT DETECTED   Staphylococcus species NOT DETECTED NOT DETECTED   Staphylococcus aureus NOT DETECTED NOT DETECTED   Streptococcus species NOT DETECTED NOT DETECTED   Streptococcus agalactiae NOT DETECTED NOT DETECTED   Streptococcus pneumoniae NOT DETECTED NOT DETECTED   Streptococcus pyogenes NOT DETECTED NOT DETECTED   Acinetobacter baumannii NOT DETECTED NOT DETECTED   Enterobacteriaceae species NOT DETECTED NOT DETECTED   Enterobacter cloacae complex NOT DETECTED NOT DETECTED   Escherichia coli NOT DETECTED NOT DETECTED   Klebsiella oxytoca NOT DETECTED NOT DETECTED   Klebsiella pneumoniae NOT DETECTED NOT DETECTED   Proteus species NOT DETECTED NOT DETECTED   Serratia marcescens NOT DETECTED NOT DETECTED   Haemophilus influenzae NOT DETECTED NOT DETECTED   Neisseria meningitidis NOT DETECTED NOT DETECTED   Pseudomonas aeruginosa NOT DETECTED NOT DETECTED   Candida albicans NOT DETECTED NOT DETECTED   Candida glabrata NOT DETECTED NOT DETECTED   Candida krusei NOT DETECTED NOT DETECTED   Candida parapsilosis NOT DETECTED NOT DETECTED   Candida tropicalis NOT DETECTED NOT DETECTED   *Called because 1/2 pediatric bottles with GPC clusters on gm stain only. Cultures remain negative.   Changes to prescribed antibiotics required: none needed  Brain Hilts 05/11/2016  5:03 PM

## 2016-05-11 NOTE — Progress Notes (Signed)
ANTIBIOTIC CONSULT NOTE - Pharmacy Consult for:  Transition to PO Augmentin Indication: aspiration PNA   Allergies  Allergen Reactions  . Niacin     REACTION: rash    Patient Measurements: Height: 5\' 10"  (177.8 cm) Weight: 211 lb 11.2 oz (96 kg) IBW/kg (Calculated) : 73 Adjusted Body Weight:   Vital Signs: Temp: 97.6 F (36.4 C) (03/22 1327) Temp Source: Oral (03/22 1327) BP: 129/81 (03/22 1327) Pulse Rate: 60 (03/22 1327) Intake/Output from previous day: 03/21 0701 - 03/22 0700 In: 640 [P.O.:640] Out: 0  Intake/Output from this shift: Total I/O In: -  Out: 30 [Chest Tube:30]  Labs:  Recent Labs  05/08/16 1845 05/10/16 0250 05/11/16 0228  WBC 4.0 4.5 3.5*  HGB 15.6 14.1 13.4  PLT 203 161 142*  CREATININE 1.00 1.10  --    Estimated Creatinine Clearance: 82 mL/min (by C-G formula based on SCr of 1.1 mg/dL). No results for input(s): VANCOTROUGH, VANCOPEAK, VANCORANDOM, GENTTROUGH, GENTPEAK, GENTRANDOM, TOBRATROUGH, TOBRAPEAK, TOBRARND, AMIKACINPEAK, AMIKACINTROU, AMIKACIN in the last 72 hours.   Microbiology: Recent Results (from the past 720 hour(s))  Culture, expectorated sputum-assessment     Status: None   Collection Time: 05/09/16  1:45 PM  Result Value Ref Range Status   Specimen Description EXPECTORATED SPUTUM  Final   Special Requests NONE  Final   Sputum evaluation THIS SPECIMEN IS ACCEPTABLE FOR SPUTUM CULTURE  Final   Report Status 05/10/2016 FINAL  Final  Culture, respiratory (NON-Expectorated)     Status: None (Preliminary result)   Collection Time: 05/09/16  1:45 PM  Result Value Ref Range Status   Specimen Description EXPECTORATED SPUTUM  Final   Special Requests NONE Reflexed from [id number]  Final   Gram Stain   Final    ABUNDANT WBC PRESENT, PREDOMINANTLY PMN ABUNDANT GRAM NEGATIVE RODS FEW GRAM POSITIVE RODS MODERATE GRAM POSITIVE COCCI IN PAIRS RARE GRAM NEGATIVE COCCI IN PAIRS    Culture CULTURE REINCUBATED FOR BETTER GROWTH  Final   Report Status PENDING  Incomplete  Culture, blood (routine x 2)     Status: None (Preliminary result)   Collection Time: 05/09/16  2:23 PM  Result Value Ref Range Status   Specimen Description BLOOD LEFT HAND  Final   Special Requests IN PEDIATRIC BOTTLE 3CC  Final   Culture NO GROWTH 2 DAYS  Final   Report Status PENDING  Incomplete  Culture, blood (routine x 2)     Status: None (Preliminary result)   Collection Time: 05/09/16  2:30 PM  Result Value Ref Range Status   Specimen Description BLOOD LEFT ARM  Final   Special Requests IN PEDIATRIC BOTTLE 3CC  Final   Culture NO GROWTH 2 DAYS  Final   Report Status PENDING  Incomplete    Medical History: Past Medical History:  Diagnosis Date  . Adrenal mass ([place])    "benign" (04/08/2013  . Allergic rhinitis   . Anginal pain ([place])   . Arthritis    "probably in my fingers" (04/08/2013)  . Basal cell carcinoma 2000's   'chest"  . Birt-Hogg-Dube syndrome   . Bradycardia   . Dyslipidemia (high LDL; low HDL)   . 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 History of blood transfusion ?1995  . Hx of colonic polyps   . Hyperlipidemia   . Hypertension   . Ileus, postoperative ([place]) 04/17/2013  . Spontaneous pneumothorax 1995   Assessment:  Aspiration PNA.. Right middle lobe infiltrate on CT  chest with fevers on 05/09/2016.  Currently Afebrile. Tmax 99.7. WBC 3.5k. SCr = 1.10, estimate CrCl is 80 ml/min Continuing azithromycin  Antimicrobials this admission: Azithro 3/20>> Unasyn 3/20>>3/22 Augmentin 3/22>  Goal of Therapy:  Eradication of infection   Plan:  Discontinue Unasyn Start Augmentin 875 mg po BID Pharmacy will sign off. Please reconsult for further dosing assitance.   Nicole Cella, RPh Clinical Pharmacist 8A-4P [phone] 4P-10P [phone] [place] [phone] 05/11/2016,4:21 PM

## 2016-05-12 ENCOUNTER — Inpatient Hospital Stay (HOSPITAL_COMMUNITY): Payer: 59

## 2016-05-12 ENCOUNTER — Other Ambulatory Visit: Payer: Self-pay | Admitting: Cardiothoracic Surgery

## 2016-05-12 DIAGNOSIS — J939 Pneumothorax, unspecified: Secondary | ICD-10-CM

## 2016-05-12 LAB — CULTURE, RESPIRATORY

## 2016-05-12 LAB — CULTURE, RESPIRATORY W GRAM STAIN: Culture: NORMAL

## 2016-05-12 MED ORDER — AZITHROMYCIN 500 MG PO TABS: 500.0000 mg | ORAL_TABLET | Freq: Every day | ORAL | 0 refills | Status: DC

## 2016-05-12 MED ORDER — IBUPROFEN 200 MG PO TABS: 600.0000 mg | ORAL_TABLET | Freq: Four times a day (QID) | ORAL | 0 refills | Status: DC | PRN

## 2016-05-12 MED ORDER — AMOXICILLIN-POT CLAVULANATE 875-125 MG PO TABS: 1.0000 | ORAL_TABLET | Freq: Two times a day (BID) | ORAL | 0 refills | Status: DC

## 2016-05-12 MED FILL — AZITHROMYCIN 500 MG TABLET: 500 | 5 days supply | Qty: 5 | Fill #0

## 2016-05-12 MED FILL — AMOX-CLAV 875-125 MG TABLET: 875-125 | 5 days supply | Qty: 10 | Fill #0

## 2016-05-12 NOTE — Discharge Instructions (Signed)
Follow with Primary MD [name], MD in 3 days , follow final blood culture results  Get CBC, CMP, 2 view Chest X ray checked  by Primary MD in 3 days ( we routinely change or add medications that can affect your baseline labs and fluid status, therefore we recommend that you get the mentioned basic workup next visit with your PCP, your PCP may decide not to get them or add new tests based on their clinical decision)  Activity: As tolerated with Full fall precautions use walker/cane & assistance as needed  Disposition Home    Diet:   Heart Healthy    For Heart failure patients - Check your Weight same time everyday, if you gain over 2 pounds, or you develop in leg swelling, experience more shortness of breath or chest pain, call your Primary MD immediately. Follow Cardiac Low Salt Diet and 1.5 lit/day fluid restriction.  On your next visit with your primary care physician please Get Medicines reviewed and adjusted.  Please request your Prim.MD to go over all Hospital Tests and Procedure/Radiological results at the follow up, please get all Hospital records sent to your Prim MD by signing hospital release before you go home.  If you experience worsening of your admission symptoms, develop shortness of breath, life threatening emergency, suicidal or homicidal thoughts you must seek medical attention immediately by calling 911 or calling your MD immediately  if symptoms less severe.  You Must read complete instructions/literature along with all the possible adverse reactions/side effects for all the Medicines you take and that have been prescribed to you. Take any new Medicines after you have completely understood and accpet all the possible adverse reactions/side effects.   Do not drive, operate heavy machinery, perform activities at heights, swimming or participation in water activities or provide baby sitting services if your were admitted for syncope or siezures until you have seen  by Primary MD or a Neurologist and advised to do so again.  Do not drive when taking Pain medications.    Do not take more than prescribed Pain, Sleep and Anxiety Medications  Special Instructions: If you have smoked or chewed Tobacco  in the last 2 yrs please stop smoking, stop any regular Alcohol  and or any Recreational drug use.  Wear Seat belts while driving.   Please note  You were cared for by a hospitalist during your hospital stay. If you have any questions about your discharge medications or the care you received while you were in the hospital after you are discharged, you can call the unit and asked to speak with the hospitalist on call if the hospitalist that took care of you is not available. Once you are discharged, your primary care physician will handle any further medical issues. Please note that NO REFILLS for any discharge medications will be authorized once you are discharged, as it is imperative that you return to your primary care physician (or establish a relationship with a primary care physician if you do not have one) for your aftercare needs so that they can reassess your need for medications and monitor your lab values.             Pneumothorax A pneumothorax, commonly called a collapsed lung, is a condition in which air leaks from a lung and builds up in the space between the lung and the chest wall (pleural space). The air in a pneumothorax is trapped outside the lung and takes up space, preventing the lung from fully  expanding. This is a condition that usually occurs suddenly. The buildup of air may be small or large. A small pneumothorax may go away on its own. When a pneumothorax is larger, it will often require medical treatment and hospitalization. What are the causes? A pneumothorax can sometimes happen quickly with no apparent cause. People with underlying lung problems, particularly COPD or emphysema, are at higher risk of pneumothorax. However,  pneumothorax can happen quickly even in people with no prior known lung problems. Trauma, surgery, medical procedures, or injury to the chest wall can also cause a pneumothorax. What are the signs or symptoms? Sometimes a pneumothorax will have no symptoms. When symptoms are present, they can include:  Chest pain.  Shortness of breath.  Increased rate of breathing.  Bluish color to your lips or skin (cyanosis). How is this diagnosed? Pneumothorax is usually diagnosed by a chest X-ray or chest CT scan. Your health care provider will also take a medical history and perform a physical exam to determine why you may have a pneumothorax. How is this treated? A small pneumothorax may go away on its own without treatment. Extra oxygen can sometimes help a small pneumothorax go away more quickly. For a larger pneumothorax or a pneumothorax that is causing symptoms, a procedure is usually needed to drain the air.In some cases, the health care provider may drain the air using a needle. In other cases, a chest tube may be inserted into the pleural space. A chest tube is a small tube placed between the ribs and into the pleural space. This removes the extra air and allows the lung to expand back to its normal size. A large pneumothorax will usually require a hospital stay. If there is ongoing air leakage into the pleural space, then the chest tube may need to remain in place for several days until the air leak has healed. In some cases, surgery may be needed. Follow these instructions at home:  Only take over-the-counter or prescription medicines as directed by your health care provider.  If a cough or pain makes it difficult for you to sleep at night, try sleeping in a semi-upright position in a recliner or by using 2 or 3 pillows.  Rest and limit activity as directed by your health care provider.  If you had a chest tube and it was removed, ask your health care provider when it is okay to remove the  dressing. Until your health care provider says you can remove the dressing, do not allow it to get wet.  Do not smoke. Smoking is a risk factor for pneumothorax.  Do not fly in an airplane or scuba dive until your health care provider says it is okay.  Follow up with your health care provider as directed. Get help right away if:  You have increasing chest pain or shortness of breath.  You have a cough that is not controlled with suppressants.  You begin coughing up blood.  You have pain that is getting worse or is not controlled with medicines.  You cough up thick, discolored mucus (sputum) that is yellow to green in color.  You have redness, increasing pain, or discharge at the site where a chest tube had been in place (if your pneumothorax was treated with a chest tube).  The site where your chest tube was located opens up.  You feel air coming out of the site where the chest tube was placed.  You have a fever or persistent symptoms for more than  2-3 days.  You have a fever and your symptoms suddenly get worse. This information is not intended to replace advice given to you by your health care provider. Make sure you discuss any questions you have with your health care provider. Document Released: 02/06/2005 Document Revised: 07/15/2015 Document Reviewed: 07/02/2013 [employer] Interactive Patient Education  2017 [employer].

## 2016-05-12 NOTE — Discharge Summary (Signed)
KRISHNA DANCEL PJP:[id number] DOB: Apr 07, 1955 DOA: 05/08/2016  PCP: [name], MD  Admit date: 05/08/2016  Discharge date: 05/12/2016  Admitted From: Home Disposition:  Home   Recommendations for Outpatient Follow-up:   Follow up with PCP in 1-2 weeks  PCP Please obtain BMP/CBC, 2 view CXR in 1week,  (see Discharge instructions)   PCP Please follow up on the following pending results: Neck CBC, BMP and a two-view chest x-ray in 3-4 days. Check final blood culture results.   Home Health: None  Equipment/Devices: None  Consultations: CTS Discharge Condition: Stable   CODE STATUS: Full   Diet Recommendation:  Heart Healthy    Chief Complaint  Patient presents with  . collapsed lung     Brief history of present illness from the day of admission and additional interim summary    Stavros today has, No headache, No chest pain, No abdominal pain - No Nausea, No new weakness tingling or numbness, Mild cough and shortness of breath, better than before.                                                                   Hospital Course    1.Right-sided spontaneous pneumothorax in a patient with history of Birt-Hogg-Dube syndrome, prior left-sided pneumothorax requiring chest tube along with multiple lung cysts. Predisposed to developing pneumothorax due to the underlying pathology, has received right-sided chest tube on 05/08/2016 placed by cardiothoracic surgery,Leak resolved and chest tube was removed on 05/11/2016, the chest x-ray shows 5-10% residual pneumothorax cleared by cardiac thoracic surgery for home discharge. Patient is symptom free request PCP to check CBC, BMP and a two-view chest x-ray in 3-4 days. He will also follow with cardiothoracic surgery post discharge.  2. Right middle lobe  infiltrate on CT chest with fevers on 05/09/2016. Responded well to appropriate antibiotics, switched to oral regimen on 05/11/2016, continue incentive spirometry, overall much better. Note one out of 2 blood cultures is growing gram-positive cocci in clusters, this appears to be contamination talked with lab on 05/12/2016. Somehow BCID for this culture is -ve. Lab will look into further testing today and confirmed by tomorrow.   4 to stay in the hospital to follow up on final culture results however he and his wife were adamant to be discharged, they do not want to stay, patient will be discharged on 5 more days of oral antibiotics which will be Augmentin and azithromycin, request PCP to check final blood culture results.  3. Essential hypertension. Continue combination of ACE inhibitor and Norvasc and follow with PCP.    Discharge diagnosis     Principal Problem:   Pneumothorax on right Active Problems:   Essential hypertension   Birt-Hogg-Dube syndrome   URI (upper respiratory infection)    Discharge instructions  Discharge Instructions    AMB Referral to [facility]    Complete by:  As directed    Please assign UMR member for post discharge call. Currently at [facility]. Denies any Link to Wellness needs at this time. Please see liaison notes. Call with questions. Thanks. Marthenia Rolling, [place], [facility] Liaison-[phone]   Reason for consult:  Please assign UMR member for post discharge call   Expected date of contact:  1-3 days (reserved for hospital discharges)   Diet - low sodium heart healthy    Complete by:  As directed    Discharge instructions    Complete by:  As directed    Follow with Primary MD [name], MD in 3 days , follow final blood culture results  Get CBC, CMP, 2 view Chest X ray checked  by Primary MD in 3 days ( we routinely change or add medications that can affect your baseline labs and fluid status,  therefore we recommend that you get the mentioned basic workup next visit with your PCP, your PCP may decide not to get them or add new tests based on their clinical decision)  Activity: As tolerated with Full fall precautions use walker/cane & assistance as needed  Disposition Home    Diet:   Heart Healthy    For Heart failure patients - Check your Weight same time everyday, if you gain over 2 pounds, or you develop in leg swelling, experience more shortness of breath or chest pain, call your Primary MD immediately. Follow Cardiac Low Salt Diet and 1.5 lit/day fluid restriction.  On your next visit with your primary care physician please Get Medicines reviewed and adjusted.  Please request your Prim.MD to go over all Hospital Tests and Procedure/Radiological results at the follow up, please get all Hospital records sent to your Prim MD by signing hospital release before you go home.  If you experience worsening of your admission symptoms, develop shortness of breath, life threatening emergency, suicidal or homicidal thoughts you must seek medical attention immediately by calling 911 or calling your MD immediately  if symptoms less severe.  You Must read complete instructions/literature along with all the possible adverse reactions/side effects for all the Medicines you take and that have been prescribed to you. Take any new Medicines after you have completely understood and accpet all the possible adverse reactions/side effects.   Do not drive, operate heavy machinery, perform activities at heights, swimming or participation in water activities or provide baby sitting services if your were admitted for syncope or siezures until you have seen by Primary MD or a Neurologist and advised to do so again.  Do not drive when taking Pain medications.    Do not take more than prescribed Pain, Sleep and Anxiety Medications  Special Instructions: If you have smoked or chewed Tobacco  in the last 2 yrs  please stop smoking, stop any regular Alcohol  and or any Recreational drug use.  Wear Seat belts while driving.   Please note  You were cared for by a hospitalist during your hospital stay. If you have any questions about your discharge medications or the care you received while you were in the hospital after you are discharged, you can call the unit and asked to speak with the hospitalist on call if the hospitalist that took care of you is not available. Once you are discharged, your primary care physician will handle any further medical issues. Please note that NO REFILLS for  any discharge medications will be authorized once you are discharged, as it is imperative that you return to your primary care physician (or establish a relationship with a primary care physician if you do not have one) for your aftercare needs so that they can reassess your need for medications and monitor your lab values.   Increase activity slowly    Complete by:  As directed       Discharge Medications   Allergies as of 05/12/2016      Reactions   Niacin    REACTION: rash      Medication List    TAKE these medications   amLODipine 10 MG tablet Commonly known as:  NORVASC Take 1 tablet (10 mg total) by mouth daily.   amoxicillin-clavulanate 875-125 MG tablet Commonly known as:  AUGMENTIN Take 1 tablet by mouth every 12 (twelve) hours.   aspirin EC 81 MG tablet Take 81 mg by mouth every morning.   azithromycin 500 MG tablet Commonly known as:  ZITHROMAX Take 1 tablet (500 mg total) by mouth daily. Start taking on:  05/13/2016   benzonatate 100 MG capsule Commonly known as:  TESSALON PERLES Take 1 capsule (100 mg total) by mouth 3 (three) times daily as needed.   cetirizine 10 MG tablet Commonly known as:  ZYRTEC Take 10 mg by mouth every morning.   fluticasone 50 MCG/ACT nasal spray Commonly known as:  FLONASE Place 2 sprays into the nose daily as needed. For allergies.   ibuprofen 200 MG  tablet Commonly known as:  ADVIL,MOTRIN Take 3 tablets (600 mg total) by mouth every 6 (six) hours as needed. You can take 2-3 of these every 6 hours as needed. What changed:  how much to take  how to take this  when to take this  reasons to take this   lisinopril 40 MG tablet Commonly known as:  PRINIVIL,ZESTRIL Take 1 tablet (40 mg total) by mouth daily.   multivitamin tablet Take 1 tablet by mouth daily.   OVER THE COUNTER MEDICATION 1 tablet daily. "bio flex"   PROBIOTIC PO Take 1 capsule by mouth daily.       Follow-up Information    Grace Isaac, MD Follow up.   Specialty:  Cardiothoracic Surgery Why:  PA/LAT CXR to be taken (at [facility] which is in the same building as Dr. Everrett Coombe office) one hour prior to office appointment;Office will call with appointment date and time Contact information: [place] [address] [phone]        [name], MD. Schedule an appointment as soon as possible for a visit in 3 day(s).   Specialty:  Internal Medicine Why:  CBC, BMP, two-view chest x-ray checked. Get final blood culture results checked Contact information: [place]  [address] [phone]           Major procedures and Radiology Reports - PLEASE review detailed and final reports thoroughly  -     R.Chest tube - CTS - 05-08-16, Removed on 05/11/2016  Dg Chest 2 View  Result Date: 05/12/2016 CLINICAL DATA:  Recent chest tube removal. EXAM: CHEST  2 VIEW COMPARISON:  05/11/2016 FINDINGS: The cardiomediastinal silhouette is unchanged. Small caliber right-sided chest tube has been removed in the interim, and a small amount of right chest wall soft tissue emphysema is noted. Small right pneumothorax is unchanged. There is persistent elevation of the left hemidiaphragm. The lungs are unchanged in appearance with scattered cysts  again seen and with minimal scarring or atelectasis  at the lung bases. There is no evidence of new airspace consolidation or pleural effusion. Postsurgical changes are noted at the left lung apex. No acute osseous abnormality is seen. IMPRESSION: Unchanged small right apical pneumothorax following chest tube removal. Electronically Signed   By: Logan Bores M.D.   On: 05/12/2016 08:30   Dg Chest 2 View  Result Date: 05/08/2016 CLINICAL DATA:  Shortness of breath, cough. EXAM: CHEST  2 VIEW COMPARISON:  Radiograph of April 07, 2013. FINDINGS: Stable cardiomediastinal silhouette. Stable left basilar scarring is noted. Atherosclerosis of thoracic aorta is noted. There is interval development of large right pneumothorax which is greater than 50%. Bony thorax is unremarkable. IMPRESSION: Large right pneumothorax. Critical Value/emergent results were called by telephone at the time of interpretation on 05/08/2016 at 4:45 pm to Dr. Colin Benton , who verbally acknowledged these results. Electronically Signed   By: Marijo Conception, M.D.   On: 05/08/2016 16:45   Ct Chest Wo Contrast  Result Date: 05/09/2016 CLINICAL DATA:  61 year old male inpatient with Birt-Hogg-Dube syndrome status post right chest tube placement for right pneumothorax, presents for evaluation of right chest tube position. EXAM: CT CHEST WITHOUT CONTRAST TECHNIQUE: Multidetector CT imaging of the chest was performed following the standard protocol without IV contrast. COMPARISON:  Chest radiograph from earlier today. 02/28/2005 chest CT. FINDINGS: Cardiovascular: Normal heart size. No significant pericardial fluid/thickening. Left main, left anterior descending and left circumflex coronary atherosclerosis. Atherosclerotic nonaneurysmal thoracic aorta. Normal caliber pulmonary arteries. Mediastinum/Nodes: No discrete thyroid nodules. Unremarkable esophagus. No axillary adenopathy. Mild right paratracheal adenopathy measuring up to 1.1 cm (series 3/ image 61), new since 2007. Newly mildly enlarged  1.0 cm subcarinal node (series 3/ image 71). No additional pathologically enlarged mediastinal or gross hilar lymph nodes on this noncontrast scan. Lungs/Pleura: There is a small (approximately 10%) right pneumothorax, predominantly in the anterior basilar right pleural space. Right lateral chest tube enters the pleural space in the right seventh intercostal space and terminates in the posterior peripheral right mid pleural space at the level of the main pulmonary artery. No left pneumothorax. No pleural effusion. Suture line is noted in the apical left upper lobe. Numerous thin-walled air cysts are scattered throughout both lungs, mildly progressed since 2007, largest in the medial left upper lobe and central lower lobes. There is patchy tree-in-bud opacity with surrounding ground-glass opacity in the dependent right middle lobe, new. There is mild dependent atelectasis in both lower lobes. Otherwise no consolidative airspace disease or significant pulmonary nodules. Upper abdomen: Cholecystectomy. Left adrenal 2.9 cm mass is stable since 2007, compatible with a benign adenoma. Musculoskeletal:  No aggressive appearing focal osseous lesions. IMPRESSION: 1. Small right pneumothorax (approximately 10%). Right chest tube terminates posteriorly in the peripheral right mid pleural space. 2. Scattered thin-walled air cysts throughout both lungs, mildly progressed since 2007, compatible with Birt-Hogg-Dube syndrome. 3. New mild bronchopneumonia in the dependent right middle lobe. 4. Mild dependent atelectasis in the lower lobes. 5. New mild mediastinal lymphadenopathy, nonspecific, probably reactive. Consider a short-term follow-up chest CT with IV contrast in 3 months. 6. Aortic atherosclerosis. Left main and two-vessel coronary atherosclerosis. 7. Stable left adrenal adenoma. Electronically Signed   By: Ilona Sorrel M.D.   On: 05/09/2016 10:54   Dg Chest Port 1 View  Result Date: 05/11/2016 CLINICAL DATA:   61 year old male with spontaneous right pneumothorax status post chest tube. Birt-Hogg-Dube syndrome EXAM: PORTABLE CHEST 1 VIEW COMPARISON:  05/10/2016 and earlier. FINDINGS: Portable AP upright view at 0643 hours. Small caliber right chest tube appears stable, tip projecting at the mid right lung level. Small right pneumothorax persists. Stable lung volumes. Stable cardiac size and mediastinal contours. No acute pulmonary opacity. No acute osseous abnormality identified. IMPRESSION: 1. Stable small caliber right chest tube and small residual pneumothorax. 2. No new cardiopulmonary abnormality. Electronically Signed   By: Genevie Ann M.D.   On: 05/11/2016 07:35   Dg Chest Port 1 View  Result Date: 05/10/2016 CLINICAL DATA:  Pneumothorax, chest tube. EXAM: PORTABLE CHEST 1 VIEW COMPARISON:  05/09/2016 FINDINGS: Small for right chest tube noted. Decreasing size of the right pneumothorax with small residual lateral right pneumothorax. Left base atelectasis. Heart is normal size. IMPRESSION: Small right chest tube remains in stable position with decreasing size of the right pneumothorax, now 5-10%. Electronically Signed   By: Rolm Baptise M.D.   On: 05/10/2016 08:48   Dg Chest Port 1 View  Result Date: 05/09/2016 CLINICAL DATA:  Pneumothorax. EXAM: PORTABLE CHEST 1 VIEW COMPARISON:  Radiograph of May 08, 2016. FINDINGS: Stable cardiomediastinal silhouette. Stable position of right-sided chest tube. Mild right pneumothorax is noted laterally which is increased in size compared to prior exam. Postsurgical changes are seen in the left lung apex. Mild left basilar subsegmental atelectasis is noted. Bony thorax is unremarkable. IMPRESSION: Mild left basilar subsegmental atelectasis. Stable position of right-sided chest tube. Mild right pneumothorax is noted which is slightly increased in size compared to prior exam. Electronically Signed   By: Marijo Conception, M.D.   On: 05/09/2016 07:12   Dg Chest Port 1  View  Result Date: 05/08/2016 CLINICAL DATA:  Right chest tube insertion EXAM: PORTABLE CHEST 1 VIEW COMPARISON:  05/08/2016 FINDINGS: AP semi-erect view chest demonstrates insertion of a right lower chest drainage catheter with the tip overlying the lateral lower lung. There is decreased size of right pneumothorax with a small residual lateral pneumothorax noted. Decreased shift of the contents to the left. There is linear scarring in the left lower lung and right lung base. Stable heart size. IMPRESSION: Placement of a right lower chest drainage catheter with decreased right-sided pneumothorax. A small residual right lateral pneumothorax is visualized. Electronically Signed   By: Donavan Foil M.D.   On: 05/08/2016 22:44    Micro Results   Recent Results (from the past 240 hour(s))  Culture, expectorated sputum-assessment     Status: None   Collection Time: 05/09/16  1:45 PM  Result Value Ref Range Status   Specimen Description EXPECTORATED SPUTUM  Final   Special Requests NONE  Final   Sputum evaluation THIS SPECIMEN IS ACCEPTABLE FOR SPUTUM CULTURE  Final   Report Status 05/10/2016 FINAL  Final  Culture, respiratory (NON-Expectorated)     Status: None   Collection Time: 05/09/16  1:45 PM  Result Value Ref Range Status   Specimen Description EXPECTORATED SPUTUM  Final   Special Requests NONE Reflexed from [id number]  Final   Gram Stain   Final    ABUNDANT WBC PRESENT, PREDOMINANTLY PMN ABUNDANT GRAM NEGATIVE RODS FEW GRAM POSITIVE RODS MODERATE GRAM POSITIVE COCCI IN PAIRS RARE GRAM NEGATIVE COCCI IN PAIRS    Culture Consistent with normal respiratory flora.  Final   Report Status 05/12/2016 FINAL  Final  Culture, blood (routine x 2)     Status: Abnormal (Preliminary result)   Collection Time: 05/09/16  2:23 PM  Result Value Ref Range Status   Specimen Description  BLOOD LEFT HAND  Final   Special Requests IN PEDIATRIC BOTTLE 3CC  Final   Culture  Setup Time   Final    GRAM POSITIVE  COCCI IN CLUSTERS CRITICAL RESULT CALLED TO, READ BACK BY AND VERIFIED WITH: J. Carney Pharm.D. 17:00 05/11/16 ([email or web address])    Culture STAPHYLOCOCCUS CAPITIS (A)  Final   Report Status PENDING  Incomplete  Blood Culture ID Panel (Reflexed)     Status: None   Collection Time: 05/09/16  2:23 PM  Result Value Ref Range Status   Enterococcus species NOT DETECTED NOT DETECTED Final   Listeria monocytogenes NOT DETECTED NOT DETECTED Final   Staphylococcus species NOT DETECTED NOT DETECTED Final   Staphylococcus aureus NOT DETECTED NOT DETECTED Final   Streptococcus species NOT DETECTED NOT DETECTED Final   Streptococcus agalactiae NOT DETECTED NOT DETECTED Final   Streptococcus pneumoniae NOT DETECTED NOT DETECTED Final   Streptococcus pyogenes NOT DETECTED NOT DETECTED Final   Acinetobacter baumannii NOT DETECTED NOT DETECTED Final   Enterobacteriaceae species NOT DETECTED NOT DETECTED Final   Enterobacter cloacae complex NOT DETECTED NOT DETECTED Final   Escherichia coli NOT DETECTED NOT DETECTED Final   Klebsiella oxytoca NOT DETECTED NOT DETECTED Final   Klebsiella pneumoniae NOT DETECTED NOT DETECTED Final   Proteus species NOT DETECTED NOT DETECTED Final   Serratia marcescens NOT DETECTED NOT DETECTED Final   Haemophilus influenzae NOT DETECTED NOT DETECTED Final   Neisseria meningitidis NOT DETECTED NOT DETECTED Final   Pseudomonas aeruginosa NOT DETECTED NOT DETECTED Final   Candida albicans NOT DETECTED NOT DETECTED Final   Candida glabrata NOT DETECTED NOT DETECTED Final   Candida krusei NOT DETECTED NOT DETECTED Final   Candida parapsilosis NOT DETECTED NOT DETECTED Final   Candida tropicalis NOT DETECTED NOT DETECTED Final  Culture, blood (routine x 2)     Status: None (Preliminary result)   Collection Time: 05/09/16  2:30 PM  Result Value Ref Range Status   Specimen Description BLOOD LEFT ARM  Final   Special Requests IN PEDIATRIC BOTTLE 3CC  Final   Culture NO GROWTH 2 DAYS   Final   Report Status PENDING  Incomplete    Today   Subjective   Jeshua Ransford today has no headache,no chest abdominal pain,no new weakness tingling or numbness, feels much better wants to go home today.     Objective   Blood pressure (!) 171/72, pulse (!) 59, temperature 97.5 F (36.4 C), temperature source Oral, resp. rate 18, height 5\' 10"  (1.778 m), weight 96 kg (211 lb 11.2 oz), SpO2 96 %.  No intake or output data in the 24 hours ending 05/12/16 1152  Exam Awake Alert, Oriented x 3, No new F.N deficits, Normal affect [place].AT,PERRAL Supple Neck,No JVD, No cervical lymphadenopathy appriciated.  Symmetrical Chest wall movement, Good air movement bilaterally, CTAB RRR,No Gallops,Rubs or new Murmurs, No Parasternal Heave +ve B.Sounds, Abd Soft, Non tender, No organomegaly appriciated, No rebound -guarding or rigidity. No Cyanosis, Clubbing or edema, No new Rash or bruise   Data Review   CBC w Diff:  Lab Results  Component Value Date   WBC 3.5 (L) 05/11/2016   HGB 13.4 05/11/2016   HCT 39.2 05/11/2016   PLT 142 (L) 05/11/2016   LYMPHOPCT 30 05/08/2016   MONOPCT 15 05/08/2016   EOSPCT 7 05/08/2016   BASOPCT 0 05/08/2016    CMP:  Lab Results  Component Value Date   NA 139 05/10/2016   K 3.8 05/10/2016  CL 103 05/10/2016   CO2 28 05/10/2016   BUN 14 05/10/2016   CREATININE 1.10 05/10/2016   PROT 6.9 09/13/2015   ALBUMIN 4.5 09/13/2015   BILITOT 0.5 09/13/2015   ALKPHOS 78 09/13/2015   AST 16 09/13/2015   ALT 24 09/13/2015  .   Total Time in preparing paper work, data evaluation and todays exam - 35 minutes  Thurnell Lose M.D on 05/12/2016 at 11:52 AM  [facility]   Office  [phone]

## 2016-05-12 NOTE — Progress Notes (Signed)
PROGRESS NOTE                                                                                                                                                                                                             Patient Demographics:    Ernest Hudson, is a 61 y.o. male, DOB - [birth year], NIO:[id number]  Admit date - 05/08/2016   Admitting Physician Etta Quill, DO  Outpatient Primary MD for the patient is [name], MD  LOS - 4  Chief Complaint  Patient presents with  . collapsed lung       Brief Narrative   Ernest Hudson is a 61 y.o. 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  Patient presents to the ED from his PCPs office with c/o SOB, Workup showed right-sided spontaneous pneumothorax requiring chest replacement by cardiothoracic surgery. He also had signs of pneumonia on CT scan along with symptoms of the same.   Subjective:    Ernest Hudson today has, No headache, No chest pain, No abdominal pain - No Nausea, No new weakness tingling or numbness, Mild cough and shortness of breath, better than before.     Assessment  & Plan :    1.Right-sided spontaneous pneumothorax in a patient with history of Birt-Hogg-Dube syndrome, prior left-sided pneumothorax requiring chest tube along with multiple lung cysts. Predisposed to developing pneumothorax due to the underlying pathology, has received right-sided chest tube on 05/08/2016 placed by cardiothoracic surgery, His air leak seems to have resolved, Chest x-ray appears stable we'll defer removal of chest tube to cardiothoracic surgery, if chest tube removed likely discharge on 05/12/2016.  2. Right middle lobe infiltrate on CT chest with fevers on 05/09/2016. Responded well to appropriate antibiotics, switched to oral regimen on 05/11/2016, continue incentive spirometry, overall  much better. Note one out of 2 blood cultures is growing gram-positive cocci in clusters, this appears to be contamination talked with lab on 05/12/2016. Somehow BCID for this culture is -ve. Lab will look into further testing today and confirmed by tomorrow.  3. Essential hypertension. Continue combination of ACE inhibitor and Norvasc and monitor blood pressures.    Diet : Diet regular Room service appropriate? Yes; Fluid consistency: Thin    Family Communication  :  Wife bedside  Code Status :  Full  Disposition Plan  : Likely DC in am  Consults  :  CTS  Procedures  :    R.Chest tube - CTS - 05-08-16  DVT Prophylaxis  :  Lovenox    Lab Results  Component Value Date   PLT 142 (L) 05/11/2016    Inpatient Medications  Scheduled Meds: . amLODipine  10 mg Oral Daily  . amoxicillin-clavulanate  1 tablet Oral Q12H  . aspirin EC  81 mg Oral q morning - 10a  . azithromycin  500 mg Oral Daily  . enoxaparin (LOVENOX) injection  40 mg Subcutaneous Daily  . fluticasone  2 spray Each Nare Daily  . lisinopril  40 mg Oral Daily  . loratadine  10 mg Oral Daily  . sodium chloride flush  3 mL Intravenous Q12H   Continuous Infusions: . sodium chloride 20 mL/hr at 05/09/16 0159   PRN Meds:.benzonatate, ibuprofen, morphine injection  Antibiotics  :    Anti-infectives    Start     Dose/Rate Route Frequency Ordered Stop   05/11/16 1245  amoxicillin-clavulanate (AUGMENTIN) 875-125 MG per tablet 1 tablet     1 tablet Oral Every 12 hours 05/11/16 1133     05/09/16 1500  azithromycin (ZITHROMAX) tablet 500 mg     500 mg Oral Daily 05/09/16 1345     05/09/16 1500  Ampicillin-Sulbactam (UNASYN) 3 g in sodium chloride 0.9 % 100 mL IVPB  Status:  Discontinued     3 g 200 mL/hr over 30 Minutes Intravenous Every 8 hours 05/09/16 1409 05/11/16 1125         Objective:   Vitals:   05/11/16 1327 05/11/16 2151 05/12/16 0509 05/12/16 0812  BP: 129/81 134/77 131/73 (!) 171/72  Pulse: 60 68  (!) 54 (!) 59  Resp: 18 18 18    Temp: 97.6 F (36.4 C) 97.8 F (36.6 C) 97.5 F (36.4 C)   TempSrc: Oral Oral Oral   SpO2: 94% 95% 96%   Weight:      Height:        Wt Readings from Last 3 Encounters:  05/09/16 96 kg (211 lb 11.2 oz)  05/08/16 98.1 kg (216 lb 3.2 oz)  03/06/16 99.8 kg (220 lb)    No intake or output data in the 24 hours ending 05/12/16 1132   Physical Exam  Awake Alert, Oriented X 3, No new F.N deficits, Normal affect Ernest Hudson.AT,PERRAL Supple Neck,No JVD, No cervical lymphadenopathy appriciated.  Symmetrical Chest wall movement, Good air movement bilaterally, Right-sided chest tube, few crackles in the right base. RRR,No Gallops,Rubs or new Murmurs, No Parasternal Heave +ve B.Sounds, Abd Soft, No tenderness, No organomegaly appriciated, No rebound - guarding or rigidity. No Cyanosis, Clubbing or edema, No new Rash or bruise      Data Review:    CBC  Recent Labs Lab 05/08/16 1845 05/10/16 0250 05/11/16 0228  WBC 4.0 4.5 3.5*  HGB 15.6 14.1 13.4  HCT 44.8 42.0 39.2  PLT 203 161 142*  MCV 83.7 87.1 86.5  MCH 29.2 29.3 29.6  MCHC 34.8 33.6 34.2  RDW 13.3 13.7 13.6  LYMPHSABS 1.2  --   --   MONOABS 0.6  --   --   EOSABS 0.3  --   --   BASOSABS 0.0  --   --     Chemistries   Recent Labs Lab 05/08/16 1845 05/10/16 0250  NA 140 139  K 3.8 3.8  CL 108 103  CO2 24 28  GLUCOSE  128* 110*  BUN 15 14  CREATININE 1.00 1.10  CALCIUM 9.4 8.5*   ------------------------------------------------------------------------------------------------------------------ No results for input(s): CHOL, HDL, LDLCALC, TRIG, CHOLHDL, LDLDIRECT in the last 72 hours.  No results found for: HGBA1C ------------------------------------------------------------------------------------------------------------------ No results for input(s): TSH, T4TOTAL, T3FREE, THYROIDAB in the last 72 hours.  Invalid input(s):  FREET3 ------------------------------------------------------------------------------------------------------------------ No results for input(s): VITAMINB12, FOLATE, FERRITIN, TIBC, IRON, RETICCTPCT in the last 72 hours.  Coagulation profile No results for input(s): INR, PROTIME in the last 168 hours.  No results for input(s): DDIMER in the last 72 hours.  Cardiac Enzymes No results for input(s): CKMB, TROPONINI, MYOGLOBIN in the last 168 hours.  Invalid input(s): CK ------------------------------------------------------------------------------------------------------------------ No results found for: BNP  Micro Results Recent Results (from the past 240 hour(s))  Culture, expectorated sputum-assessment     Status: None   Collection Time: 05/09/16  1:45 PM  Result Value Ref Range Status   Specimen Description EXPECTORATED SPUTUM  Final   Special Requests NONE  Final   Sputum evaluation THIS SPECIMEN IS ACCEPTABLE FOR SPUTUM CULTURE  Final   Report Status 05/10/2016 FINAL  Final  Culture, respiratory (NON-Expectorated)     Status: None   Collection Time: 05/09/16  1:45 PM  Result Value Ref Range Status   Specimen Description EXPECTORATED SPUTUM  Final   Special Requests NONE Reflexed from [id number]  Final   Gram Stain   Final    ABUNDANT WBC PRESENT, PREDOMINANTLY PMN ABUNDANT GRAM NEGATIVE RODS FEW GRAM POSITIVE RODS MODERATE GRAM POSITIVE COCCI IN PAIRS RARE GRAM NEGATIVE COCCI IN PAIRS    Culture Consistent with normal respiratory flora.  Final   Report Status 05/12/2016 FINAL  Final  Culture, blood (routine x 2)     Status: Abnormal (Preliminary result)   Collection Time: 05/09/16  2:23 PM  Result Value Ref Range Status   Specimen Description BLOOD LEFT HAND  Final   Special Requests IN PEDIATRIC BOTTLE 3CC  Final   Culture  Setup Time   Final    GRAM POSITIVE COCCI IN CLUSTERS CRITICAL RESULT CALLED TO, READ BACK BY AND VERIFIED WITH: J. Carney Pharm.D. 17:00 05/11/16  ([email or web address])    Culture STAPHYLOCOCCUS CAPITIS (A)  Final   Report Status PENDING  Incomplete  Blood Culture ID Panel (Reflexed)     Status: None   Collection Time: 05/09/16  2:23 PM  Result Value Ref Range Status   Enterococcus species NOT DETECTED NOT DETECTED Final   Listeria monocytogenes NOT DETECTED NOT DETECTED Final   Staphylococcus species NOT DETECTED NOT DETECTED Final   Staphylococcus aureus NOT DETECTED NOT DETECTED Final   Streptococcus species NOT DETECTED NOT DETECTED Final   Streptococcus agalactiae NOT DETECTED NOT DETECTED Final   Streptococcus pneumoniae NOT DETECTED NOT DETECTED Final   Streptococcus pyogenes NOT DETECTED NOT DETECTED Final   Acinetobacter baumannii NOT DETECTED NOT DETECTED Final   Enterobacteriaceae species NOT DETECTED NOT DETECTED Final   Enterobacter cloacae complex NOT DETECTED NOT DETECTED Final   Escherichia coli NOT DETECTED NOT DETECTED Final   Klebsiella oxytoca NOT DETECTED NOT DETECTED Final   Klebsiella pneumoniae NOT DETECTED NOT DETECTED Final   Proteus species NOT DETECTED NOT DETECTED Final   Serratia marcescens NOT DETECTED NOT DETECTED Final   Haemophilus influenzae NOT DETECTED NOT DETECTED Final   Neisseria meningitidis NOT DETECTED NOT DETECTED Final   Pseudomonas aeruginosa NOT DETECTED NOT DETECTED Final   Candida albicans NOT DETECTED NOT DETECTED Final   Candida glabrata NOT DETECTED NOT  DETECTED Final   Candida krusei NOT DETECTED NOT DETECTED Final   Candida parapsilosis NOT DETECTED NOT DETECTED Final   Candida tropicalis NOT DETECTED NOT DETECTED Final  Culture, blood (routine x 2)     Status: None (Preliminary result)   Collection Time: 05/09/16  2:30 PM  Result Value Ref Range Status   Specimen Description BLOOD LEFT ARM  Final   Special Requests IN PEDIATRIC BOTTLE 3CC  Final   Culture NO GROWTH 2 DAYS  Final   Report Status PENDING  Incomplete    Radiology Reports Dg Chest 2 View  Result Date:  05/12/2016 CLINICAL DATA:  Recent chest tube removal. EXAM: CHEST  2 VIEW COMPARISON:  05/11/2016 FINDINGS: The cardiomediastinal silhouette is unchanged. Small caliber right-sided chest tube has been removed in the interim, and a small amount of right chest wall soft tissue emphysema is noted. Small right pneumothorax is unchanged. There is persistent elevation of the left hemidiaphragm. The lungs are unchanged in appearance with scattered cysts again seen and with minimal scarring or atelectasis at the lung bases. There is no evidence of new airspace consolidation or pleural effusion. Postsurgical changes are noted at the left lung apex. No acute osseous abnormality is seen. IMPRESSION: Unchanged small right apical pneumothorax following chest tube removal. Electronically Signed   By: Logan Bores M.D.   On: 05/12/2016 08:30   Dg Chest 2 View  Result Date: 05/08/2016 CLINICAL DATA:  Shortness of breath, cough. EXAM: CHEST  2 VIEW COMPARISON:  Radiograph of April 07, 2013. FINDINGS: Stable cardiomediastinal silhouette. Stable left basilar scarring is noted. Atherosclerosis of thoracic aorta is noted. There is interval development of large right pneumothorax which is greater than 50%. Bony thorax is unremarkable. IMPRESSION: Large right pneumothorax. Critical Value/emergent results were called by telephone at the time of interpretation on 05/08/2016 at 4:45 pm to Dr. Colin Benton , who verbally acknowledged these results. Electronically Signed   By: Marijo Conception, M.D.   On: 05/08/2016 16:45   Ct Chest Wo Contrast  Result Date: 05/09/2016 CLINICAL DATA:  61 year old male inpatient with Birt-Hogg-Dube syndrome status post right chest tube placement for right pneumothorax, presents for evaluation of right chest tube position. EXAM: CT CHEST WITHOUT CONTRAST TECHNIQUE: Multidetector CT imaging of the chest was performed following the standard protocol without IV contrast. COMPARISON:  Chest radiograph from  earlier today. 02/28/2005 chest CT. FINDINGS: Cardiovascular: Normal heart size. No significant pericardial fluid/thickening. Left main, left anterior descending and left circumflex coronary atherosclerosis. Atherosclerotic nonaneurysmal thoracic aorta. Normal caliber pulmonary arteries. Mediastinum/Nodes: No discrete thyroid nodules. Unremarkable esophagus. No axillary adenopathy. Mild right paratracheal adenopathy measuring up to 1.1 cm (series 3/ image 61), new since 2007. Newly mildly enlarged 1.0 cm subcarinal node (series 3/ image 71). No additional pathologically enlarged mediastinal or gross hilar lymph nodes on this noncontrast scan. Lungs/Pleura: There is a small (approximately 10%) right pneumothorax, predominantly in the anterior basilar right pleural space. Right lateral chest tube enters the pleural space in the right seventh intercostal space and terminates in the posterior peripheral right mid pleural space at the level of the main pulmonary artery. No left pneumothorax. No pleural effusion. Suture line is noted in the apical left upper lobe. Numerous thin-walled air cysts are scattered throughout both lungs, mildly progressed since 2007, largest in the medial left upper lobe and central lower lobes. There is patchy tree-in-bud opacity with surrounding ground-glass opacity in the dependent right middle lobe, new. There is mild dependent atelectasis in  both lower lobes. Otherwise no consolidative airspace disease or significant pulmonary nodules. Upper abdomen: Cholecystectomy. Left adrenal 2.9 cm mass is stable since 2007, compatible with a benign adenoma. Musculoskeletal:  No aggressive appearing focal osseous lesions. IMPRESSION: 1. Small right pneumothorax (approximately 10%). Right chest tube terminates posteriorly in the peripheral right mid pleural space. 2. Scattered thin-walled air cysts throughout both lungs, mildly progressed since 2007, compatible with Birt-Hogg-Dube syndrome. 3. New mild  bronchopneumonia in the dependent right middle lobe. 4. Mild dependent atelectasis in the lower lobes. 5. New mild mediastinal lymphadenopathy, nonspecific, probably reactive. Consider a short-term follow-up chest CT with IV contrast in 3 months. 6. Aortic atherosclerosis. Left main and two-vessel coronary atherosclerosis. 7. Stable left adrenal adenoma. Electronically Signed   By: Ilona Sorrel M.D.   On: 05/09/2016 10:54   Dg Chest Port 1 View  Result Date: 05/11/2016 CLINICAL DATA:  61 year old male with spontaneous right pneumothorax status post chest tube. Birt-Hogg-Dube syndrome EXAM: PORTABLE CHEST 1 VIEW COMPARISON:  05/10/2016 and earlier. FINDINGS: Portable AP upright view at 0643 hours. Small caliber right chest tube appears stable, tip projecting at the mid right lung level. Small right pneumothorax persists. Stable lung volumes. Stable cardiac size and mediastinal contours. No acute pulmonary opacity. No acute osseous abnormality identified. IMPRESSION: 1. Stable small caliber right chest tube and small residual pneumothorax. 2. No new cardiopulmonary abnormality. Electronically Signed   By: Genevie Ann M.D.   On: 05/11/2016 07:35   Dg Chest Port 1 View  Result Date: 05/10/2016 CLINICAL DATA:  Pneumothorax, chest tube. EXAM: PORTABLE CHEST 1 VIEW COMPARISON:  05/09/2016 FINDINGS: Small for right chest tube noted. Decreasing size of the right pneumothorax with small residual lateral right pneumothorax. Left base atelectasis. Heart is normal size. IMPRESSION: Small right chest tube remains in stable position with decreasing size of the right pneumothorax, now 5-10%. Electronically Signed   By: Rolm Baptise M.D.   On: 05/10/2016 08:48   Dg Chest Port 1 View  Result Date: 05/09/2016 CLINICAL DATA:  Pneumothorax. EXAM: PORTABLE CHEST 1 VIEW COMPARISON:  Radiograph of May 08, 2016. FINDINGS: Stable cardiomediastinal silhouette. Stable position of right-sided chest tube. Mild right pneumothorax is  noted laterally which is increased in size compared to prior exam. Postsurgical changes are seen in the left lung apex. Mild left basilar subsegmental atelectasis is noted. Bony thorax is unremarkable. IMPRESSION: Mild left basilar subsegmental atelectasis. Stable position of right-sided chest tube. Mild right pneumothorax is noted which is slightly increased in size compared to prior exam. Electronically Signed   By: Marijo Conception, M.D.   On: 05/09/2016 07:12   Dg Chest Port 1 View  Result Date: 05/08/2016 CLINICAL DATA:  Right chest tube insertion EXAM: PORTABLE CHEST 1 VIEW COMPARISON:  05/08/2016 FINDINGS: AP semi-erect view chest demonstrates insertion of a right lower chest drainage catheter with the tip overlying the lateral lower lung. There is decreased size of right pneumothorax with a small residual lateral pneumothorax noted. Decreased shift of the contents to the left. There is linear scarring in the left lower lung and right lung base. Stable heart size. IMPRESSION: Placement of a right lower chest drainage catheter with decreased right-sided pneumothorax. A small residual right lateral pneumothorax is visualized. Electronically Signed   By: Donavan Foil M.D.   On: 05/08/2016 22:44    Time Spent in minutes  30   SINGH,PRASHANT K M.D on 05/12/2016 at 11:32 AM  Between 7am to 7pm - Pager - [phone] ( page  via amion, text pages only, please mention full 10 digit call back number).  After 7pm go to [email or web address] - password [facility]  [facility] -  Office  [phone]

## 2016-05-12 NOTE — Progress Notes (Addendum)
      [address]       [place] [address]             [phone]           Subjective: Patient feels better and wants to go home.  Objective: Vital signs in last 24 hours: Temp:  [97.5 F (36.4 C)-97.8 F (36.6 C)] 97.5 F (36.4 C) (03/23 0509) Pulse Rate:  [54-68] 54 (03/23 0509) Cardiac Rhythm: Normal sinus rhythm (03/22 1900) Resp:  [18] 18 (03/23 0509) BP: (129-134)/(73-81) 131/73 (03/23 0509) SpO2:  [94 %-96 %] 96 % (03/23 0509)    Intake/Output from previous day: 03/22 0701 - 03/23 0700 In: -  Out: 30 [Chest Tube:30]   Physical Exam:  Cardiovascular: RRR Pulmonary: Clear to auscultation bilaterally; no rales Wounds: Dressing is cean and dry.     Lab Results: CBC: Recent Labs  05/10/16 0250 05/11/16 0228  WBC 4.5 3.5*  HGB 14.1 13.4  HCT 42.0 39.2  PLT 161 142*   BMET:  Recent Labs  05/10/16 0250  NA 139  K 3.8  CL 103  CO2 28  GLUCOSE 110*  BUN 14  CREATININE 1.10  CALCIUM 8.5*    PT/INR: No results for input(s): LABPROT, INR in the last 72 hours. ABG:  INR: Will add last result for INR, ABG once components are confirmed Will add last 4 CBG results once components are confirmed  Assessment/Plan:  1. CV - SR in the 60's. On Amlodipine 10 mg daily and Lisinopril 40 mg daily as taken prior to admission. 2.  Pulmonary - On room air. Chest tube removed yesterday. CXR this am appears to show small right pneumothorax, minor subcutaneous emphysema right lateral chest wall.  3. ID-On Azithromycin and Augmentin for presumed acquired pneumonia. 4. Will arrange follow up appointment with Dr. Suzanne Boron MPA-C 05/12/2016,7:49 AM  Dg Chest 2 View  Result Date: 05/12/2016 CLINICAL DATA:  Recent chest tube removal. EXAM: CHEST  2 VIEW COMPARISON:  05/11/2016 FINDINGS: The cardiomediastinal silhouette is unchanged. Small caliber right-sided chest tube has been removed in the interim, and a small amount of right  chest wall soft tissue emphysema is noted. Small right pneumothorax is unchanged. There is persistent elevation of the left hemidiaphragm. The lungs are unchanged in appearance with scattered cysts again seen and with minimal scarring or atelectasis at the lung bases. There is no evidence of new airspace consolidation or pleural effusion. Postsurgical changes are noted at the left lung apex. No acute osseous abnormality is seen. IMPRESSION: Unchanged small right apical pneumothorax following chest tube removal. Electronically Signed   By: Logan Bores M.D.   On: 05/12/2016 08:30   Chest xray stable, 5% ptx , could have follow up chest xray as outpatient  I have seen and examined Ernest Hudson and agree with the above assessment  and plan.  Grace Isaac MD Beeper [phone] Office [phone] 05/12/2016 11:04 AM

## 2016-05-12 NOTE — Progress Notes (Signed)
Pt given discharge instructions and all questions answered. Prescriptions given to patient at discharge.

## 2016-05-12 NOTE — Care Management Note (Signed)
Case Management Note Marvetta Gibbons RN, BSN Unit 2W-Case Manager [phone]  Patient Details  Name: Ernest Hudson MRN: [id number] Date of Birth: [birth year]  Subjective/Objective:   Pt admitted with Pntx- chest tube placement                 Action/Plan: PTA pt lived at home- independent- plan to return home- no CM needs noted for discharge  Expected Discharge Date:  05/12/16               Expected Discharge Plan:  Home/Self Care  In-House Referral:     Discharge planning Services  CM Consult  Post Acute Care Choice:  NA Choice offered to:  NA  DME Arranged:    DME Agency:     HH Arranged:    [place] Agency:     Status of Service:  Completed, signed off  If discussed at [employer] of Stay Meetings, dates discussed:    Additional Comments:  Dawayne Patricia, RN 05/12/2016, 12:19 PM

## 2016-05-13 LAB — CULTURE, BLOOD (ROUTINE X 2)

## 2016-05-14 LAB — CULTURE, BLOOD (ROUTINE X 2): Culture: NO GROWTH

## 2016-05-15 ENCOUNTER — Other Ambulatory Visit: Payer: Self-pay | Admitting: *Deleted

## 2016-05-15 ENCOUNTER — Ambulatory Visit (INDEPENDENT_AMBULATORY_CARE_PROVIDER_SITE_OTHER)
Admission: RE | Admit: 2016-05-15 | Discharge: 2016-05-15 | Disposition: A | Discharge status: Home / Self Care | Payer: 59 | Source: Ambulatory Visit | Attending: Family Medicine | Admitting: Family Medicine

## 2016-05-15 ENCOUNTER — Encounter: Payer: Self-pay | Admitting: Family Medicine

## 2016-05-15 ENCOUNTER — Ambulatory Visit (INDEPENDENT_AMBULATORY_CARE_PROVIDER_SITE_OTHER): Payer: 59 | Admitting: Family Medicine

## 2016-05-15 VITALS — BP 118/82 | HR 73 | Temp 98.2°F | Ht 70.0 in | Wt 217.8 lb

## 2016-05-15 DIAGNOSIS — J189 Pneumonia, unspecified organism: Secondary | ICD-10-CM | POA: Diagnosis not present

## 2016-05-15 DIAGNOSIS — Q8789 Other specified congenital malformation syndromes, not elsewhere classified: Secondary | ICD-10-CM

## 2016-05-15 DIAGNOSIS — J939 Pneumothorax, unspecified: Secondary | ICD-10-CM

## 2016-05-15 LAB — CBC
HCT: 44.2 % (ref 39.0–52.0)
HEMOGLOBIN: 15.6 g/dL (ref 13.0–17.0)
MCHC: 35.3 g/dL (ref 30.0–36.0)
MCV: 85.7 fl (ref 78.0–100.0)
Platelets: 272 10*3/uL (ref 150.0–400.0)
RBC: 5.15 Mil/uL (ref 4.22–5.81)
RDW: 13.7 % (ref 11.5–15.5)
WBC: 7.2 10*3/uL (ref 4.0–10.5)

## 2016-05-15 LAB — BASIC METABOLIC PANEL
BUN: 18 mg/dL (ref 6–23)
CHLORIDE: 105 meq/L (ref 96–112)
CO2: 29 meq/L (ref 19–32)
CREATININE: 0.95 mg/dL (ref 0.40–1.50)
Calcium: 9.6 mg/dL (ref 8.4–10.5)
GFR: 85.44 mL/min (ref >60.00)
Glucose, Bld: 99 mg/dL (ref 70–99)
POTASSIUM: 3.8 meq/L (ref 3.5–5.1)
Sodium: 142 mEq/L (ref 135–145)

## 2016-05-15 MED FILL — AMLODIPINE BESYLATE 10 MG T: 10 | 90 days supply | Qty: 90 | Fill #2

## 2016-05-15 NOTE — Progress Notes (Signed)
Pre visit review using our clinic review tool, if applicable. No additional management support is needed unless otherwise documented below in the visit note. 

## 2016-05-15 NOTE — Progress Notes (Signed)
HPI:  Ernest Hudson is a pleasant 61 yo M patient of Dr. Burnice Logan with a PMH significant for Birt-Hogg-Dube syndrome, prior pneumothorax, multiple lung cysts here for hospital follow up in PCP absence. Per discharge summary and review records: Hospitalized: 3/19-3/23/18 Diagnosis: Right sided pneumothorax - required chest tube; will follow up with CTS Other diagnoses/complications: -multiple lung cysts -R middle lobe infiltrate on CT - discharged on abx -one + blood culture felt to be contamination per hospital physician notes, reflex labs neg -blood cx 3/20 L arm NGx5days, Blood Cx ID panel neg from 3/20 -Per discharge summary - is to check CBC, CMP and CXR   Reports he is gradually feeling better since leaving hospital. Still with some fatigue and a nonproductive cough. Continues the abx. No fevers, hemoptysis, significant SOB.  ROS: See pertinent positives and negatives per HPI.  Past Medical History:  Diagnosis Date  . 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 Allergic rhinitis   . Anginal pain ([place])   . Arthritis    "probably in my fingers" (04/08/2013)  . Basal cell carcinoma 2000's   'chest"  . Birt-Hogg-Dube syndrome   . Bradycardia   . Dyslipidemia (high LDL; low HDL)   . 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 History of blood transfusion ?1995  . Hx of colonic polyps   . Hyperlipidemia   . Hypertension   . Ileus, postoperative ([place]) 04/17/2013  . Spontaneous pneumothorax 1995    Past Surgical History:  Procedure Laterality Date  . APPENDECTOMY  04/07/2013  . APPENDECTOMY  04/07/2013   Procedure: OPEN APPENDECTOMY;  Surgeon: Gayland Curry, MD;  Location: [facility];  Service: General;;  . cardiolyte stress  04/2008  . CHOLECYSTECTOMY  2004  . COLONOSCOPY  06/2005  . ELBOW SURGERY Left 2001   "tendonitis"  . EXTERNAL EAR SURGERY  1971  . INCISIONAL HERNIA REPAIR N/A 05/12/2014   Procedure: OPEN REPAIR OF INCISIONAL HERNIA REPAIR;  Surgeon:  Greer Pickerel, MD;  Location: [facility];  Service: General;  Laterality: N/A;  . INSERTION OF MESH N/A 05/12/2014   Procedure: WITH INSERTION OF MESH;  Surgeon: Greer Pickerel, MD;  Location: [facility];  Service: General;  Laterality: N/A;  . KNEE ARTHROSCOPY Left 1990's  . LAPAROSCOPIC APPENDECTOMY N/A 04/07/2013   Procedure: ATTEMPTED APPENDECTOMY LAPAROSCOPIC;  Surgeon: Gayland Curry, MD;  Location: [facility];  Service: General;  Laterality: N/A;  . LUNG SURGERY     "repaired ruptured bleb" '95  . MIDDLE EAR SURGERY Left 1972   exploratory tympanotomy/notes 07/17/2000  . NASAL SEPTUM SURGERY    . PARTIAL COLECTOMY  04/07/2013   Procedure: PARTIAL COLECTOMY, ILEOCECTOMY;  Surgeon: Gayland Curry, MD;  Location: [facility];  Service: General;;  . SKIN CANCER EXCISION  2000's   "chest"  . TENDON REPAIR Left 1990's   "thumb"    Family History  Problem Relation Age of Onset  . Healthy Sister   . Lung disease Sister     has blebs on MRI,   . Breast cancer Maternal Aunt     diagnosed in her 63s  . Melanoma Maternal Uncle   . Breast cancer Maternal Aunt     diagnosed in her 90s  . Breast cancer Maternal Aunt     diagnosed in her 52s  . Breast cancer Mother   . Diabetes Mother   . Heart attack Father   . Diabetes Father   .  Prostate cancer Father   . Healthy Brother   . Healthy Brother   . Cancer Cousin     multiple myeloma  . Colon cancer Neg Hx     Social History   Social History  . Marital status: Married    Spouse name: N/A  . Number of children: N/A  . Years of education: N/A   Social History Main Topics  . Smoking status: Former Smoker    Packs/day: 2.00    Years: 30.00    Types: Cigarettes    Quit date: 07/30/2012  . Smokeless tobacco: Never Used  . Alcohol use No  . Drug use: No  . Sexual activity: Not Currently   Other Topics Concern  . None   Social History Narrative  . None     Current Outpatient Prescriptions:  .  amLODipine (NORVASC) 10 MG tablet, Take 1 tablet (10  mg total) by mouth daily., Disp: 30 tablet, Rfl: 10 .  amoxicillin-clavulanate (AUGMENTIN) 875-125 MG tablet, Take 1 tablet by mouth every 12 (twelve) hours., Disp: 10 tablet, Rfl: 0 .  aspirin EC 81 MG tablet, Take 81 mg by mouth every morning. , Disp: , Rfl:  .  azithromycin (ZITHROMAX) 500 MG tablet, Take 1 tablet (500 mg total) by mouth daily., Disp: 5 tablet, Rfl: 0 .  benzonatate (TESSALON PERLES) 100 MG capsule, Take 1 capsule (100 mg total) by mouth 3 (three) times daily as needed., Disp: 20 capsule, Rfl: 0 .  cetirizine (ZYRTEC) 10 MG tablet, Take 10 mg by mouth every morning. , Disp: , Rfl:  .  fluticasone (FLONASE) 50 MCG/ACT nasal spray, Place 2 sprays into the nose daily as needed. For allergies., Disp: 16 g, Rfl: 6 .  ibuprofen (ADVIL,MOTRIN) 200 MG tablet, Take 3 tablets (600 mg total) by mouth every 6 (six) hours as needed. You can take 2-3 of these every 6 hours as needed., Disp: 20 tablet, Rfl: 0 .  lisinopril (PRINIVIL,ZESTRIL) 40 MG tablet, Take 1 tablet (40 mg total) by mouth daily., Disp: 30 tablet, Rfl: 10 .  Multiple Vitamin (MULTIVITAMIN) tablet, Take 1 tablet by mouth daily., Disp: , Rfl:  .  OVER THE COUNTER MEDICATION, 1 tablet daily. "bio flex", Disp: , Rfl:  .  Probiotic Product (PROBIOTIC PO), Take 1 capsule by mouth daily., Disp: , Rfl:   Current Facility-Administered Medications:  .  0.9 %  sodium chloride infusion, 500 mL, Intravenous, Continuous, Jerene Bears, MD  EXAM:  Vitals:   05/15/16 1349  BP: 118/82  Pulse: 73  Temp: 98.2 F (36.8 C)    Body mass index is 31.25 kg/m.  GENERAL: vitals reviewed and listed above, alert, oriented, appears well hydrated and in no acute distress  HEENT: atraumatic, conjunttiva clear, no obvious abnormalities on inspection of external nose and ears  NECK: no obvious masses on inspection  LUNGS: clear to auscultation bilaterally, no wheezes, rales or rhonchi, good air movement  CV: HRRR, no peripheral  edema  MS: moves all extremities without noticeable abnormality  PSYCH: pleasant and cooperative, no obvious depression or anxiety  ASSESSMENT AND PLAN:  Discussed the following assessment and plan:  Pneumothorax, unspecified type - Plan: DG Chest 2 View  Birt-Hogg-Dube syndrome - Plan: DG Chest 2 View  Pneumonia due to infectious organism, unspecified laterality, unspecified part of lung - Plan: DG Chest 2 View, Basic metabolic panel, CBC  -so glad he is feeling beter -CXR an labs -follow up with CTS as planned -PCP follow up in  1-2 months -Patient advised to return or notify a doctor immediately if symptoms worsen or persist or new concerns arise.  Patient Instructions  BEFORE YOU LEAVE: -xray sheet -follow up: with PCP in 1-2 months -labs  Go get the chest xray.  Follow up with your specialist as planned and sooner if any concerns.  Seek care immediately if worsening, new concerns or you are not improving with treatment.      Colin Benton R., DO

## 2016-05-15 NOTE — Patient Instructions (Signed)
BEFORE YOU LEAVE: -xray sheet -follow up: with PCP in 1-2 months -labs  Go get the chest xray.  Follow up with your specialist as planned and sooner if any concerns.  Seek care immediately if worsening, new concerns or you are not improving with treatment.

## 2016-05-15 NOTE — Patient Outreach (Signed)
[facility]) Care Management  05/15/2016  Ernest Hudson 1955-06-12 [id number]   Subjective: Telephone call to patient's home \ mobile number, no answer, left HIPAA compliant voicemail message, and requested call back.   Objective: Per [facility] point of care tool and chart review, patient hospitalized 05/08/16 -05/12/16 for  Pneumothorax on right.   Patient also has a history of hypertension and Birt-Hogg-Dube syndrome.     Assessment: Received [facility] Transition of care referral on 05/10/16.  Transition of care follow up pending patient contact.   Plan: RNCM will call patient for 2nd telephone outreach attempt, transition of care follow up, within 10 business days if no return call.   Lanette Ell H. Annia Friendly, BSN, [place] Management [facility] Telephonic CM Phone: [phone] Fax: [phone]

## 2016-05-16 ENCOUNTER — Encounter: Payer: Self-pay | Admitting: *Deleted

## 2016-05-16 ENCOUNTER — Other Ambulatory Visit: Payer: Self-pay | Admitting: *Deleted

## 2016-05-16 NOTE — Patient Outreach (Signed)
[facility]) Care Management  05/16/2016  Ernest Hudson 04-16-1955 [id number]    Subjective: Telephone call to patient's home / mobile number, spoke with patient, and HIPAA verified.   Discussed [facility] Care Management [facility]MR Transition of care follow up, patient voices understanding, and is in agreement to complete follow up. Patient states he is still feeling crappy due to a sinus infection and cough.  States he saw is primary MD's partner on 05/15/16, she is aware of his symptoms, has ran more test, is waiting for results, and next steps to be determined by MD.   States his wife is also sick with double pneumonia.  He has a follow up appointment with surgeon on 05/22/16.   States he has a decreased energy level, is taking it easy, gradually increasing activity, and is self employed.  Patient states he will call [facility] today regarding his lisinopril refill prior to MD office closure.  States he will ask his wife to follow up regarding hospital indemnity supplemental insurance and file claims if appropriate.  Patient states he does not have any transition of care, care coordination, disease management, disease monitoring, transportation, community resource, or pharmacy needs at this time.  States he is very appreciative of the follow up and is in agreement to receive [place] Management information.  Objective: Per [facility] point of care tool and chart review, patient hospitalized 05/08/16 -05/12/16 for Pneumothorax on right.   Patient also has a history of hypertension and Birt-Hogg-Dube syndrome.     Assessment: Received [facility]MR Transition of care referral on 05/10/16. Transition of care follow up completed, no care management needs, and will proceed with case closure.    Plan: RNCM will send patient successful outreach letter, Ernest Hudson pamphlet, and magnet. RNCM will send case closure due to follow up completed / no care management needs request to [facility] [place] [facility].   Aedan Geimer H. Annia Friendly, BSN, [place] Management [facility] Telephonic CM Phone: [phone] Fax: [phone]

## 2016-05-17 ENCOUNTER — Telehealth: Payer: Self-pay | Admitting: Internal Medicine

## 2016-05-17 NOTE — Telephone Encounter (Signed)
° °  Pt would like RX sent to [facility] request refill of the following:  lisinopril (PRINIVIL,ZESTRIL) 40 MG tablet  Dr Raliegh Ip said it was ok to fill med since pt has a physical in July   Phamacy: [facility]

## 2016-05-19 ENCOUNTER — Other Ambulatory Visit: Payer: Self-pay | Admitting: Internal Medicine

## 2016-05-22 ENCOUNTER — Ambulatory Visit (INDEPENDENT_AMBULATORY_CARE_PROVIDER_SITE_OTHER): Payer: 59 | Admitting: Physician Assistant

## 2016-05-22 ENCOUNTER — Ambulatory Visit
Admission: RE | Admit: 2016-05-22 | Discharge: 2016-05-22 | Disposition: A | Discharge status: Home / Self Care | Payer: 59 | Source: Ambulatory Visit | Attending: Cardiothoracic Surgery | Admitting: Cardiothoracic Surgery

## 2016-05-22 VITALS — BP 116/79 | HR 69 | Resp 20 | Ht 70.0 in | Wt 217.0 lb

## 2016-05-22 DIAGNOSIS — J939 Pneumothorax, unspecified: Secondary | ICD-10-CM | POA: Diagnosis not present

## 2016-05-22 NOTE — Progress Notes (Signed)
[address]       [place] [address]             [phone]       CARDIAC SURGERY POSTOPERATIVE VISIT  Patient Name: Ernest Hudson MRN: [id number] DOB: Feb 26, 1955  Subjective: Ernest Hudson is a 61 y.o. male here for routine follow up from chest tube placement for a right pneumothorax. He hasBirt-Hogg-Dube syndrome. Dr. Servando Snare placed the right chest ton admission on 03/19. He was also found to have an infiltrate on the right. He was put on Azithromycin and Augmentin and he was discharged on these. He was then ischarged in stable condition on 05/12/2016. He denies chest pain or shortness of breath. He is already back to work.  Past Medical History:  Diagnosis Date  . 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 Allergic rhinitis   . Anginal pain ([place])   . Arthritis    "probably in my fingers" (04/08/2013)  . Basal cell carcinoma 2000's   'chest"  . Birt-Hogg-Dube syndrome   . Bradycardia   . Dyslipidemia (high LDL; low HDL)   . 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 History of blood transfusion ?1995  . Hx of colonic polyps   . Hyperlipidemia   . Hypertension   . Ileus, postoperative ([place]) 04/17/2013  . Spontaneous pneumothorax 1995   Prior to Admission medications   Medication Sig Start Date End Date Taking? Authorizing Provider  amLODipine (NORVASC) 10 MG tablet Take 1 tablet (10 mg total) by mouth daily. 09/30/14  Yes Marletta Lor, MD  aspirin EC 81 MG tablet Take 81 mg by mouth every morning.    Yes Historical Provider, MD  cetirizine (ZYRTEC) 10 MG tablet Take 10 mg by mouth every morning.    Yes Historical Provider, MD  fluticasone (FLONASE) 50 MCG/ACT nasal spray Place 2 sprays into the nose daily as needed. For allergies. 05/07/12  Yes Marletta Lor, MD  ibuprofen (ADVIL,MOTRIN) 200 MG tablet Take 3 tablets (600 mg total) by mouth every 6 (six) hours as needed. You can take 2-3 of these every 6 hours as  needed. 05/12/16  Yes Thurnell Lose, MD  lisinopril (PRINIVIL,ZESTRIL) 40 MG tablet TAKE 1 TABLET BY MOUTH ONCE DAILY 05/22/16  Yes Marletta Lor, MD  Multiple Vitamin (MULTIVITAMIN) tablet Take 1 tablet by mouth daily.   Yes Historical Provider, MD  OVER THE COUNTER MEDICATION 1 tablet daily. "bio flex"   Yes Historical Provider, MD  Probiotic Product (PROBIOTIC PO) Take 1 capsule by mouth daily.   Yes Historical Provider, MD  Vital Signs: BP 116/79, HR 69, RR 20 , Oxygen saturation 95% on room air.  Physical Exam: CARDIOVASCULAR: Regular rate and rhythm.  RESPIRATORY: Respiratory effort is normal. Lungs clear to auscultation. WOUND: clean and dry-barely visisble  Imaging Studies: CLINICAL DATA:  Follow-up right-sided pneumothorax  EXAM: CHEST  2 VIEW  COMPARISON:  PA and lateral chest x-ray of May 15, 2016  FINDINGS: The right lung is well-expanded and clear. There is no pneumothorax. On the left there is chronic volume loss and scarring at the left lung base. The heart and pulmonary vascularity are normal. There is calcification in the wall of the aortic arch. The trachea is midline. The bony thorax is unremarkable.  IMPRESSION: There is no residual pneumothorax. There is stable scarring at the left lung base.  Thoracic aortic atherosclerosis.  Electronically Signed   By: David  Martinique M.D.   On: 05/22/2016 13:56   Impression/Plan: Ernest Hudson is doing well from right chest tube placement for right pneumothorax. His chest x ray shows no pneumothorax and there are multiple lung cysts (has  Birt-Hogg-Dube syndrome). He is inquiring if he needs a follow up CT scan. I will discuss this with Dr. Servando Snare as well if another follow up appointment is needed.  Lars Pinks, PA-C 05/22/2016 2:33 PM

## 2016-05-22 NOTE — Telephone Encounter (Signed)
FYI

## 2016-05-22 NOTE — Patient Instructions (Signed)
You may continue to gradually increase your physical activity as tolerated.  Avoid activities that cause increased pain in your chest on the side of your surgical incision.  Otherwise you may continue to increase activities without any particular limitations.  Increase the intensity and duration of physical activity gradually.

## 2016-05-26 ENCOUNTER — Encounter: Payer: Self-pay | Admitting: Internal Medicine

## 2016-05-26 ENCOUNTER — Other Ambulatory Visit: Payer: Self-pay | Admitting: Internal Medicine

## 2016-05-26 MED ORDER — LISINOPRIL 40 MG PO TABS: 40.0000 mg | ORAL_TABLET | Freq: Every day | ORAL | 1 refills | Status: DC

## 2016-05-26 MED ORDER — AMLODIPINE BESYLATE 10 MG PO TABS: 10.0000 mg | ORAL_TABLET | Freq: Every day | ORAL | 1 refills | Status: DC

## 2016-05-26 MED ORDER — FLUTICASONE PROPIONATE 50 MCG/ACT NA SUSP: 2.0000 | Freq: Every day | NASAL | 6 refills | Status: AC | PRN

## 2016-06-19 MED FILL — LISINOPRIL 40 MG TABLET: 40 | 90 days supply | Qty: 90 | Fill #0

## 2016-07-05 DIAGNOSIS — H2513 Age-related nuclear cataract, bilateral: Secondary | ICD-10-CM | POA: Diagnosis not present

## 2016-08-21 MED FILL — AMLODIPINE BESYLATE 10 MG T: 10 | 90 days supply | Qty: 90 | Fill #3

## 2016-08-22 ENCOUNTER — Other Ambulatory Visit: Payer: Self-pay | Admitting: Cardiothoracic Surgery

## 2016-08-22 DIAGNOSIS — S270XXA Traumatic pneumothorax, initial encounter: Secondary | ICD-10-CM

## 2016-08-24 ENCOUNTER — Ambulatory Visit (INDEPENDENT_AMBULATORY_CARE_PROVIDER_SITE_OTHER): Payer: 59 | Admitting: Cardiothoracic Surgery

## 2016-08-24 ENCOUNTER — Encounter: Payer: Self-pay | Admitting: Cardiothoracic Surgery

## 2016-08-24 ENCOUNTER — Ambulatory Visit
Admission: RE | Admit: 2016-08-24 | Discharge: 2016-08-24 | Disposition: A | Discharge status: Home / Self Care | Payer: 59 | Source: Ambulatory Visit | Attending: Cardiothoracic Surgery | Admitting: Cardiothoracic Surgery

## 2016-08-24 VITALS — BP 143/86 | HR 57 | Resp 16 | Ht 70.0 in | Wt 217.0 lb

## 2016-08-24 DIAGNOSIS — S270XXA Traumatic pneumothorax, initial encounter: Secondary | ICD-10-CM

## 2016-08-24 DIAGNOSIS — J939 Pneumothorax, unspecified: Secondary | ICD-10-CM

## 2016-08-24 NOTE — Progress Notes (Signed)
[address]       [place] [address]             [phone]      Kaim[facility] V [facility] Medical Record #[id number] Date of Birth: Jul 12, 1955  Referring: Etta Quill, DO Primary Care: Marletta Lor, MD  Chief Complaint:   POST OP FOLLOW UP Right chest tube placed 05/08/2016  History of Present Illness:     Patient with previous history of left pneumothorax left VATS and multiple chest tube placements by Dr. Caffie Pinto many years ago. Presented with a right pneumothorax in March, chest tube was placed and he recovered uneventfully. He now comes for follow-up visit chest x-ray was done today. He's had no recurrent symptoms of difficulty breathing.     Past Medical History:  Diagnosis Date  . 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 Allergic rhinitis   . Anginal pain ([place])   . Arthritis    "probably in my fingers" (04/08/2013)  . Basal cell carcinoma 2000's   'chest"  . Birt-Hogg-Dube syndrome   . Bradycardia   . Dyslipidemia (high LDL; low HDL)   . 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 History of blood transfusion ?1995  . Hx of colonic polyps   . Hyperlipidemia   . Hypertension   . Ileus, postoperative ([place]) 04/17/2013  . Spontaneous pneumothorax 1995     History  Smoking Status  . Former Smoker  . Packs/day: 2.00  . Years: 30.00  . Types: Cigarettes  . Quit date: 07/30/2012  Smokeless Tobacco  . Never Used    History  Alcohol Use No     Allergies  Allergen Reactions  . Niacin     REACTION: rash    Current Outpatient Prescriptions  Medication Sig Dispense Refill  . amLODipine (NORVASC) 10 MG tablet Take 1 tablet (10 mg total) by mouth daily. 90 tablet 1  . aspirin EC 81 MG tablet Take 81 mg by mouth every morning.     . cetirizine (ZYRTEC) 10 MG tablet Take 10 mg by mouth every morning.     . fluticasone (FLONASE) 50 MCG/ACT nasal spray Place 2 sprays into both nostrils daily as needed. For  allergies. 16 g 6  . ibuprofen (ADVIL,MOTRIN) 200 MG tablet Take 3 tablets (600 mg total) by mouth every 6 (six) hours as needed. You can take 2-3 of these every 6 hours as needed. 20 tablet 0  . lisinopril (PRINIVIL,ZESTRIL) 40 MG tablet Take 1 tablet (40 mg total) by mouth daily. 90 tablet 1  . Multiple Vitamin (MULTIVITAMIN) tablet Take 1 tablet by mouth daily.     Current Facility-Administered Medications  Medication Dose Route Frequency Provider Last Rate Last Dose  . 0.9 %  sodium chloride infusion  500 mL Intravenous Continuous Pyrtle, Lajuan Lines, MD           Physical Exam: BP (!) 143/86 (BP Location: Right Arm, Patient Position: Sitting, Cuff Size: Large)   Pulse (!) 57   Resp 16   Ht 5\' 10"  (1.778 m)   Wt 217 lb (98.4 kg)   SpO2 99% Comment: RA  BMI 31.14 kg/m   General appearance: alert and cooperative Neurologic: intact Heart: regular rate and rhythm, S1, S2 normal, no murmur, click, rub or gallop Lungs: clear to auscultation bilaterally Abdomen: soft, non-tender; bowel sounds normal; no masses,  no organomegaly Extremities: extremities normal,  atraumatic, no cyanosis or edema and Homans sign is negative, no sign of DVT Wound: Chest tube site is healed   Diagnostic Studies & Laboratory data:     Recent Radiology Findings:  Study Result   CLINICAL DATA:  61 year old male inpatient with Birt-Hogg-Dube syndrome status post right chest tube placement for right pneumothorax, presents for evaluation of right chest tube position.  EXAM: CT CHEST WITHOUT CONTRAST  TECHNIQUE: Multidetector CT imaging of the chest was performed following the standard protocol without IV contrast.  COMPARISON:  Chest radiograph from earlier today. 02/28/2005 chest CT.  FINDINGS: Cardiovascular: Normal heart size. No significant pericardial fluid/thickening. Left main, left anterior descending and left circumflex coronary atherosclerosis. Atherosclerotic nonaneurysmal thoracic  aorta. Normal caliber pulmonary arteries.  Mediastinum/Nodes: No discrete thyroid nodules. Unremarkable esophagus. No axillary adenopathy. Mild right paratracheal adenopathy measuring up to 1.1 cm (series 3/ image 61), new since 2007. Newly mildly enlarged 1.0 cm subcarinal node (series 3/ image 71). No additional pathologically enlarged mediastinal or gross hilar lymph nodes on this noncontrast scan.  Lungs/Pleura: There is a small (approximately 10%) right pneumothorax, predominantly in the anterior basilar right pleural space. Right lateral chest tube enters the pleural space in the right seventh intercostal space and terminates in the posterior peripheral right mid pleural space at the level of the main pulmonary artery. No left pneumothorax. No pleural effusion. Suture line is noted in the apical left upper lobe. Numerous thin-walled air cysts are scattered throughout both lungs, mildly progressed since 2007, largest in the medial left upper lobe and central lower lobes. There is patchy tree-in-bud opacity with surrounding ground-glass opacity in the dependent right middle lobe, new. There is mild dependent atelectasis in both lower lobes. Otherwise no consolidative airspace disease or significant pulmonary nodules.  Upper abdomen: Cholecystectomy. Left adrenal 2.9 cm mass is stable since 2007, compatible with a benign adenoma.  Musculoskeletal:  No aggressive appearing focal osseous lesions.  IMPRESSION: 1. Small right pneumothorax (approximately 10%). Right chest tube terminates posteriorly in the peripheral right mid pleural space. 2. Scattered thin-walled air cysts throughout both lungs, mildly progressed since 2007, compatible with Birt-Hogg-Dube syndrome. 3. New mild bronchopneumonia in the dependent right middle lobe. 4. Mild dependent atelectasis in the lower lobes. 5. New mild mediastinal lymphadenopathy, nonspecific, probably reactive. Consider a short-term  follow-up chest CT with IV contrast in 3 months. 6. Aortic atherosclerosis. Left main and two-vessel coronary atherosclerosis. 7. Stable left adrenal adenoma.   Electronically Signed   By: Ilona Sorrel M.D.   On: 05/09/2016 10:54          Dg Chest 2 View  Result Date: 08/24/2016 CLINICAL DATA:  Follow-up pneumothorax EXAM: CHEST  2 VIEW COMPARISON:  05/22/2016 FINDINGS: Postoperative left chest with apical sutures, volume loss, and pleural based scarring. No visible pneumothorax. Bilateral bowl and. No visible pneumothorax. Normal heart size and aortic contours. IMPRESSION: 1. No visible pneumothorax. 2. Bullae and left-sided scarring. Electronically Signed   By: Monte Fantasia M.D.   On: 08/24/2016 09:06    I have independently reviewed the above radiology studies  and reviewed the findings with the patient.   Recent Lab Findings: Lab Results  Component Value Date   WBC 7.2 05/15/2016   HGB 15.6 05/15/2016   HCT 44.2 05/15/2016   PLT 272.0 05/15/2016   GLUCOSE 99 05/15/2016   CHOL 159 09/13/2015   TRIG 115.0 09/13/2015   HDL 37.60 (L) 09/13/2015   LDLDIRECT 123.1 10/25/2006   LDLCALC 98 09/13/2015  ALT 24 09/13/2015   AST 16 09/13/2015   NA 142 05/15/2016   K 3.8 05/15/2016   CL 105 05/15/2016   CREATININE 0.95 05/15/2016   BUN 18 05/15/2016   CO2 29 05/15/2016   TSH 2.83 09/13/2015      Assessment / Plan:      Patient with bilateral pulmonary cysts. History of bilateral pneumothoraces, right the most recent. At the time of his admission a CT of the chest suggested mild mediastinal adenopathy likely reactive, radiology had recommended a short interval follow-up CT of the chest with IV contrast. I discussed this with the patient and he is agreeable with proceeding. He will discuss with his primary care doctor adding CT of the abdomen at the same time as he has been followed in the past for adrenal adenoma, the patient gives a history of a renal mass but I cannot  confirm this on his previous abdominal CTs. We'll plan to see him back with follow-up CT in 4-6 weeks.        Grace Isaac MD      [place] , [address] Office [phone]   [place]eeper [phone]  08/24/2016 10:10 AM

## 2016-09-11 ENCOUNTER — Other Ambulatory Visit (INDEPENDENT_AMBULATORY_CARE_PROVIDER_SITE_OTHER): Payer: 59

## 2016-09-11 DIAGNOSIS — Z0000 Encounter for general adult medical examination without abnormal findings: Secondary | ICD-10-CM | POA: Diagnosis not present

## 2016-09-11 DIAGNOSIS — I10 Essential (primary) hypertension: Secondary | ICD-10-CM | POA: Diagnosis not present

## 2016-09-11 DIAGNOSIS — E785 Hyperlipidemia, unspecified: Secondary | ICD-10-CM | POA: Diagnosis not present

## 2016-09-11 LAB — BASIC METABOLIC PANEL
BUN: 19 mg/dL (ref 6–23)
CALCIUM: 9.8 mg/dL (ref 8.4–10.5)
CO2: 28 mEq/L (ref 19–32)
Chloride: 104 mEq/L (ref 96–112)
Creatinine, Ser: 1 mg/dL (ref 0.40–1.50)
GFR: 80.44 mL/min (ref >60.00)
Glucose, Bld: 91 mg/dL (ref 70–99)
POTASSIUM: 4.1 meq/L (ref 3.5–5.1)
Sodium: 141 mEq/L (ref 135–145)

## 2016-09-11 LAB — CBC WITH DIFFERENTIAL/PLATELET
BASOS ABS: 0 10*3/uL (ref 0.0–0.1)
Basophils Relative: 0.2 % (ref 0.0–3.0)
EOS PCT: 6.3 % — AB (ref 0.0–5.0)
Eosinophils Absolute: 0.4 10*3/uL (ref 0.0–0.7)
HEMATOCRIT: 49.5 % (ref 39.0–52.0)
HEMOGLOBIN: 17 g/dL (ref 13.0–17.0)
LYMPHS PCT: 35.5 % (ref 12.0–46.0)
Lymphs Abs: 2.2 10*3/uL (ref 0.7–4.0)
MCHC: 34.3 g/dL (ref 30.0–36.0)
MCV: 89.2 fl (ref 78.0–100.0)
MONOS PCT: 5.4 % (ref 3.0–12.0)
Monocytes Absolute: 0.3 10*3/uL (ref 0.1–1.0)
Neutro Abs: 3.2 10*3/uL (ref 1.4–7.7)
Neutrophils Relative %: 52.6 % (ref 43.0–77.0)
Platelets: 214 10*3/uL (ref 150.0–400.0)
RBC: 5.56 Mil/uL (ref 4.22–5.81)
RDW: 13.6 % (ref 11.5–15.5)
WBC: 6.1 10*3/uL (ref 4.0–10.5)

## 2016-09-11 LAB — TSH: TSH: 5.53 u[IU]/mL — AB (ref 0.35–4.50)

## 2016-09-11 LAB — LIPID PANEL
Cholesterol: 157 mg/dL (ref 0–200)
HDL: 36.6 mg/dL — ABNORMAL LOW (ref >39.00)
LDL Cholesterol: 99 mg/dL (ref 0–99)
NONHDL: 120.32
Total CHOL/HDL Ratio: 4
Triglycerides: 107 mg/dL (ref 0.0–149.0)
VLDL: 21.4 mg/dL (ref 0.0–40.0)

## 2016-09-11 LAB — POC URINALSYSI DIPSTICK (AUTOMATED)
Bilirubin, UA: NEGATIVE
GLUCOSE UA: NEGATIVE
Ketones, UA: NEGATIVE
Leukocytes, UA: NEGATIVE
Nitrite, UA: NEGATIVE
Protein, UA: NEGATIVE
RBC UA: NEGATIVE
SPEC GRAV UA: 1.025 (ref 1.010–1.025)
UROBILINOGEN UA: 0.2 U/dL
pH, UA: 6 (ref 5.0–8.0)

## 2016-09-11 LAB — HEPATIC FUNCTION PANEL
ALBUMIN: 4.7 g/dL (ref 3.5–5.2)
ALK PHOS: 74 U/L (ref 39–117)
ALT: 29 U/L (ref 0–53)
AST: 18 U/L (ref 0–37)
Bilirubin, Direct: 0.1 mg/dL (ref 0.0–0.3)
TOTAL PROTEIN: 7 g/dL (ref 6.0–8.3)
Total Bilirubin: 0.7 mg/dL (ref 0.2–1.2)

## 2016-09-11 LAB — PSA: PSA: 2.13 ng/mL (ref 0.10–4.00)

## 2016-09-15 MED FILL — LISINOPRIL 40 MG TABLET: 40 | 90 days supply | Qty: 90 | Fill #1

## 2016-09-19 ENCOUNTER — Encounter: Payer: Self-pay | Admitting: Internal Medicine

## 2016-09-19 ENCOUNTER — Ambulatory Visit (INDEPENDENT_AMBULATORY_CARE_PROVIDER_SITE_OTHER): Payer: 59 | Admitting: Internal Medicine

## 2016-09-19 VITALS — BP 158/80 | HR 66 | Temp 97.9°F | Ht 70.0 in | Wt 216.8 lb

## 2016-09-19 DIAGNOSIS — Z0001 Encounter for general adult medical examination with abnormal findings: Secondary | ICD-10-CM

## 2016-09-19 DIAGNOSIS — J309 Allergic rhinitis, unspecified: Secondary | ICD-10-CM

## 2016-09-19 DIAGNOSIS — Q8789 Other specified congenital malformation syndromes, not elsewhere classified: Secondary | ICD-10-CM | POA: Diagnosis not present

## 2016-09-19 DIAGNOSIS — Z8042 Family history of malignant neoplasm of prostate: Secondary | ICD-10-CM | POA: Diagnosis not present

## 2016-09-19 DIAGNOSIS — I2510 Atherosclerotic heart disease of native coronary artery without angina pectoris: Secondary | ICD-10-CM

## 2016-09-19 DIAGNOSIS — I10 Essential (primary) hypertension: Secondary | ICD-10-CM | POA: Diagnosis not present

## 2016-09-19 DIAGNOSIS — I700 Atherosclerosis of aorta: Secondary | ICD-10-CM

## 2016-09-19 DIAGNOSIS — Z0000 Encounter for general adult medical examination without abnormal findings: Secondary | ICD-10-CM

## 2016-09-19 MED ORDER — ATORVASTATIN CALCIUM 10 MG PO TABS: 10.0000 mg | ORAL_TABLET | Freq: Every day | ORAL | 3 refills | Status: DC

## 2016-09-19 MED FILL — ATORVASTATIN 10 MG TABLET: 10 | 90 days supply | Qty: 90 | Fill #0

## 2016-09-19 NOTE — Patient Instructions (Addendum)
Limit your sodium (Salt) intake  Please check your blood pressure on a regular basis.  If it is consistently greater than 150/90, please make an office appointment.  You need to lose weight.  Consider a lower calorie diet and regular exercise.  Return in one year for follow-up

## 2016-09-19 NOTE — Progress Notes (Signed)
Subjective:    Patient ID: Ernest Hudson, male    DOB: December 05, 1955, 61 y.o.   MRN: [id number]  HPI  61 year old patient seen today for a wellness exam. He enjoys excellent health.  He does have a history of essential hypertension.  He was hospitalized briefly for months ago for a spontaneous pneumothorax. He did have a colonoscopy less than one year ago. No major concerns or complaints He does have a history of mild allergic rhinitis  Past Medical History:  Diagnosis Date  . 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 Allergic rhinitis   . Anginal pain ([place])   . Arthritis    "probably in my fingers" (04/08/2013)  . Basal cell carcinoma 2000's   'chest"  . Birt-Hogg-Dube syndrome   . Bradycardia   . Dyslipidemia (high LDL; low HDL)   . 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 History of blood transfusion ?1995  . Hx of colonic polyps   . Hyperlipidemia   . Hypertension   . Ileus, postoperative ([place]) 04/17/2013  . Spontaneous pneumothorax 1995     Social History   Social History  . Marital status: Married    Spouse name: N/A  . Number of children: N/A  . Years of education: N/A   Occupational History  . Not on file.   Social History Main Topics  . Smoking status: Former Smoker    Packs/day: 2.00    Years: 30.00    Types: Cigarettes    Quit date: 07/30/2012  . Smokeless tobacco: Never Used  . Alcohol use No  . Drug use: No  . Sexual activity: Not Currently   Other Topics Concern  . Not on file   Social History Narrative  . No narrative on file    Past Surgical History:  Procedure Laterality Date  . APPENDECTOMY  04/07/2013  . APPENDECTOMY  04/07/2013   Procedure: OPEN APPENDECTOMY;  Surgeon: Gayland Curry, MD;  Location: [facility];  Service: General;;  . cardiolyte stress  04/2008  . CHOLECYSTECTOMY  2004  . COLONOSCOPY  06/2005  . ELBOW SURGERY Left 2001   "tendonitis"  . EXTERNAL EAR SURGERY  1971  . INCISIONAL HERNIA REPAIR N/A  05/12/2014   Procedure: OPEN REPAIR OF INCISIONAL HERNIA REPAIR;  Surgeon: Greer Pickerel, MD;  Location: [facility];  Service: General;  Laterality: N/A;  . INSERTION OF MESH N/A 05/12/2014   Procedure: WITH INSERTION OF MESH;  Surgeon: Greer Pickerel, MD;  Location: [facility];  Service: General;  Laterality: N/A;  . KNEE ARTHROSCOPY Left 1990's  . LAPAROSCOPIC APPENDECTOMY N/A 04/07/2013   Procedure: ATTEMPTED APPENDECTOMY LAPAROSCOPIC;  Surgeon: Gayland Curry, MD;  Location: [facility];  Service: General;  Laterality: N/A;  . LUNG SURGERY     "repaired ruptured bleb" '95  . MIDDLE EAR SURGERY Left 1972   exploratory tympanotomy/notes 07/17/2000  . NASAL SEPTUM SURGERY    . PARTIAL COLECTOMY  04/07/2013   Procedure: PARTIAL COLECTOMY, ILEOCECTOMY;  Surgeon: Gayland Curry, MD;  Location: [facility];  Service: General;;  . SKIN CANCER EXCISION  2000's   "chest"  . TENDON REPAIR Left 1990's   "thumb"    Family History  Problem Relation Age of Onset  . Healthy Sister   . Lung disease Sister        has blebs on MRI,   . Breast cancer Maternal Aunt        diagnosed  in her 50s  . Melanoma Maternal Uncle   . Breast cancer Maternal Aunt        diagnosed in her 58s  . Breast cancer Maternal Aunt        diagnosed in her 49s  . Breast cancer Mother   . Diabetes Mother   . Heart attack Father   . Diabetes Father   . Prostate cancer Father   . Healthy Brother   . Healthy Brother   . Cancer Cousin        multiple myeloma  . Colon cancer Neg Hx     Allergies  Allergen Reactions  . Niacin     REACTION: rash    Current Outpatient Prescriptions on File Prior to Visit  Medication Sig Dispense Refill  . amLODipine (NORVASC) 10 MG tablet Take 1 tablet (10 mg total) by mouth daily. 90 tablet 1  . aspirin EC 81 MG tablet Take 81 mg by mouth every morning.     . cetirizine (ZYRTEC) 10 MG tablet Take 10 mg by mouth every morning.     . fluticasone (FLONASE) 50 MCG/ACT nasal spray Place 2 sprays into both nostrils  daily as needed. For allergies. 16 g 6  . ibuprofen (ADVIL,MOTRIN) 200 MG tablet Take 3 tablets (600 mg total) by mouth every 6 (six) hours as needed. You can take 2-3 of these every 6 hours as needed. 20 tablet 0  . lisinopril (PRINIVIL,ZESTRIL) 40 MG tablet Take 1 tablet (40 mg total) by mouth daily. 90 tablet 1  . Multiple Vitamin (MULTIVITAMIN) tablet Take 1 tablet by mouth daily.     Current Facility-Administered Medications on File Prior to Visit  Medication Dose Route Frequency Provider Last Rate Last Dose  . 0.9 %  sodium chloride infusion  500 mL Intravenous Continuous Pyrtle, Lajuan Lines, MD        BP (!) 158/80 (BP Location: Left Arm, Patient Position: Sitting, Cuff Size: Normal)   Pulse 66   Temp 97.9 F (36.6 C) (Oral)   Ht 5' 10"  (1.778 m)   Wt 216 lb 12.8 oz (98.3 kg)   SpO2 98%   BMI 31.11 kg/m     Review of Systems  Constitutional: Negative for appetite change, chills, fatigue and fever.  HENT: Negative for congestion, dental problem, ear pain, hearing loss, sore throat, tinnitus, trouble swallowing and voice change.   Eyes: Negative for pain, discharge and visual disturbance.  Respiratory: Negative for cough, chest tightness, wheezing and stridor.   Cardiovascular: Negative for chest pain, palpitations and leg swelling.  Gastrointestinal: Negative for abdominal distention, abdominal pain, blood in stool, constipation, diarrhea, nausea and vomiting.  Genitourinary: Positive for decreased urine volume. Negative for difficulty urinating, discharge, flank pain, genital sores, hematuria and urgency.  Musculoskeletal: Negative for arthralgias, back pain, gait problem, joint swelling, myalgias and neck stiffness.  Skin: Negative for rash.  Neurological: Negative for dizziness, syncope, speech difficulty, weakness, numbness and headaches.  Hematological: Negative for adenopathy. Does not bruise/bleed easily.  Psychiatric/Behavioral: Negative for behavioral problems and dysphoric  mood. The patient is not nervous/anxious.        Objective:   Physical Exam  Constitutional: He appears well-developed and well-nourished.  Blood pressure 130/80- 140/80  HENT:  Head: Normocephalic and atraumatic.  Right Ear: External ear normal.  Left Ear: External ear normal.  Nose: Nose normal.  Mouth/Throat: Oropharynx is clear and moist.  Eyes: Pupils are equal, round, and reactive to light. Conjunctivae and EOM are normal. No scleral  icterus.  Neck: Normal range of motion. Neck supple. No JVD present. No thyromegaly present.  Cardiovascular: Regular rhythm, normal heart sounds and intact distal pulses.  Exam reveals no gallop and no friction rub.   No murmur heard. Pulmonary/Chest: Effort normal and breath sounds normal. He exhibits no tenderness.  Abdominal: Soft. Bowel sounds are normal. He exhibits no distension and no mass. There is no tenderness.  Genitourinary: Prostate normal and penis normal. Rectal exam shows guaiac negative stool.  Musculoskeletal: Normal range of motion. He exhibits no edema or tenderness.  Lymphadenopathy:    He has no cervical adenopathy.  Neurological: He is alert. He has normal reflexes. No cranial nerve deficit. Coordination normal.  Skin: Skin is warm and dry. No rash noted.  Psychiatric: He has a normal mood and affect. His behavior is normal.          Assessment & Plan:   Preventive health exam Essential hypertension.  Continue present regimen.  Weight loss encouraged Allergic rhinitis Mild elevation TSH Family history of prostate cancer ( father and brother) Birt-Hogg-Dube Syndrome.  Last renal imaging study March 2016.  Continue surveillance every 36 months Aortic and coronary artery atherosclerosis noted on chest CT.  Statin therapy discussed.  Have elected to start low intensity statin treatment  Continue home blood pressure monitoring Low-salt diet  Follow-up one year or as needed  [name]

## 2016-10-04 ENCOUNTER — Other Ambulatory Visit: Payer: Self-pay | Admitting: *Deleted

## 2016-10-04 DIAGNOSIS — J939 Pneumothorax, unspecified: Secondary | ICD-10-CM

## 2016-10-16 DIAGNOSIS — L814 Other melanin hyperpigmentation: Secondary | ICD-10-CM | POA: Diagnosis not present

## 2016-10-16 DIAGNOSIS — L918 Other hypertrophic disorders of the skin: Secondary | ICD-10-CM | POA: Diagnosis not present

## 2016-10-16 DIAGNOSIS — Z85828 Personal history of other malignant neoplasm of skin: Secondary | ICD-10-CM | POA: Diagnosis not present

## 2016-10-16 DIAGNOSIS — D1801 Hemangioma of skin and subcutaneous tissue: Secondary | ICD-10-CM | POA: Diagnosis not present

## 2016-10-16 DIAGNOSIS — I872 Venous insufficiency (chronic) (peripheral): Secondary | ICD-10-CM | POA: Diagnosis not present

## 2016-10-16 DIAGNOSIS — Z86018 Personal history of other benign neoplasm: Secondary | ICD-10-CM | POA: Diagnosis not present

## 2016-10-16 DIAGNOSIS — D225 Melanocytic nevi of trunk: Secondary | ICD-10-CM | POA: Diagnosis not present

## 2016-10-16 DIAGNOSIS — L821 Other seborrheic keratosis: Secondary | ICD-10-CM | POA: Diagnosis not present

## 2016-10-16 DIAGNOSIS — Q8789 Other specified congenital malformation syndromes, not elsewhere classified: Secondary | ICD-10-CM | POA: Diagnosis not present

## 2016-10-16 MED FILL — DESONIDE 0.05% CREAM: 0.05 | 14 days supply | Qty: 60 | Fill #0

## 2016-10-16 MED FILL — KETOCONAZOLE 2% CREAM: 2 | 30 days supply | Qty: 60 | Fill #0

## 2016-10-26 ENCOUNTER — Ambulatory Visit: Payer: 59 | Admitting: Cardiothoracic Surgery

## 2016-10-26 ENCOUNTER — Other Ambulatory Visit: Payer: 59

## 2016-11-02 MED FILL — CLOBETASOL 0.05% OINTMENT: 0.05 | 14 days supply | Qty: 60 | Fill #0

## 2016-11-09 ENCOUNTER — Encounter: Payer: Self-pay | Admitting: Internal Medicine

## 2016-11-29 MED FILL — AMLODIPINE BESYLATE 10 MG T: 10 | 90 days supply | Qty: 90 | Fill #0

## 2016-12-05 ENCOUNTER — Ambulatory Visit
Admission: RE | Admit: 2016-12-05 | Discharge: 2016-12-05 | Disposition: A | Discharge status: Home / Self Care | Payer: 59 | Source: Ambulatory Visit | Attending: Cardiothoracic Surgery | Admitting: Cardiothoracic Surgery

## 2016-12-05 DIAGNOSIS — J939 Pneumothorax, unspecified: Secondary | ICD-10-CM

## 2016-12-05 MED ORDER — IOPAMIDOL (ISOVUE-300) INJECTION 61%
75.0000 mL | Freq: Once | INTRAVENOUS | Status: AC | PRN
  Administered 2016-12-05: 75 mL via INTRAVENOUS

## 2016-12-07 ENCOUNTER — Encounter: Payer: Self-pay | Admitting: Cardiothoracic Surgery

## 2016-12-07 ENCOUNTER — Ambulatory Visit (INDEPENDENT_AMBULATORY_CARE_PROVIDER_SITE_OTHER): Payer: 59 | Admitting: Cardiothoracic Surgery

## 2016-12-07 VITALS — BP 129/79 | HR 62 | Ht 70.0 in | Wt 203.0 lb

## 2016-12-07 DIAGNOSIS — J939 Pneumothorax, unspecified: Secondary | ICD-10-CM | POA: Diagnosis not present

## 2016-12-07 NOTE — Progress Notes (Signed)
[address]       [place] [address]             [phone]      Ernest Hudson [facility] Medical Record #[id number] Date of Birth: [birth year]  Referring: Etta Quill, DO Primary Care: Marletta Lor, MD  Chief Complaint:   POST OP FOLLOW UP Right chest tube placed 05/08/2016  History of Present Illness:     Patient with previous history of left pneumothorax left VATS and multiple chest tube placements by Dr. Caffie Pinto many years ago. Presented with a right pneumothorax in March, chest tube was placed and he recovered uneventfully.   He comes in today with a follow-up CT scan to compare to abnormalities noted in March 2018.   Past Medical History:  Diagnosis Date  . 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 Allergic rhinitis   . Anginal pain ([place])   . Arthritis    "probably in my fingers" (04/08/2013)  . Basal cell carcinoma 2000's   'chest"  . Birt-Hogg-Dube syndrome   . Bradycardia   . Dyslipidemia (high LDL; low HDL)   . 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 History of blood transfusion ?1995  . Hx of colonic polyps   . Hyperlipidemia   . Hypertension   . Ileus, postoperative ([place]) 04/17/2013  . Spontaneous pneumothorax 1995     History  Smoking Status  . Former Smoker  . Packs/day: 2.00  . Years: 30.00  . Types: Cigarettes  . Quit date: 07/30/2012  Smokeless Tobacco  . Never Used    History  Alcohol Use No     Allergies  Allergen Reactions  . Niacin     REACTION: rash    Current Outpatient Prescriptions  Medication Sig Dispense Refill  . amLODipine (NORVASC) 10 MG tablet Take 1 tablet (10 mg total) by mouth daily. 90 tablet 1  . aspirin EC 81 MG tablet Take 81 mg by mouth every morning.     . cetirizine (ZYRTEC) 10 MG tablet Take 10 mg by mouth every morning.     . fluticasone (FLONASE) 50 MCG/ACT nasal spray Place 2 sprays into both nostrils daily as needed. For allergies. 16 g 6  .  ibuprofen (ADVIL,MOTRIN) 200 MG tablet Take 3 tablets (600 mg total) by mouth every 6 (six) hours as needed. You can take 2-3 of these every 6 hours as needed. 20 tablet 0  . lisinopril (PRINIVIL,ZESTRIL) 40 MG tablet Take 1 tablet (40 mg total) by mouth daily. 90 tablet 1  . Multiple Vitamin (MULTIVITAMIN) tablet Take 1 tablet by mouth daily.    . Omega-3 Fatty Acids (FISH OIL PO) Take by mouth.    . Omega-3 Fatty Acids (OMEGA 3 PO) Take by mouth.     Current Facility-Administered Medications  Medication Dose Route Frequency Provider Last Rate Last Dose  . 0.9 %  sodium chloride infusion  500 mL Intravenous Continuous Pyrtle, Lajuan Lines, MD           Physical Exam: BP 129/79   Pulse 62   Ht 5\' 10"  (1.778 m)   Wt 203 lb (92.1 kg)   SpO2 98%   BMI 29.13 kg/m   General appearance: alert, cooperative and appears stated age Neurologic: intact Heart: regular rate and rhythm, S1, S2 normal, no murmur, click, rub or gallop Lungs: clear to auscultation bilaterally Abdomen: soft, non-tender; bowel  sounds normal; no masses,  no organomegaly Extremities: extremities normal, atraumatic, no cyanosis or edema and Homans sign is negative, no sign of DVT Wound: chesting sites are well-healed   Diagnostic Studies & Laboratory data:     Recent Radiology Findings:  Study Result   CLINICAL DATA:  61 year old male inpatient with Birt-Hogg-Dube syndrome status post right chest tube placement for right pneumothorax, presents for evaluation of right chest tube position.  EXAM: CT CHEST WITHOUT CONTRAST  TECHNIQUE: Multidetector CT imaging of the chest was performed following the standard protocol without IV contrast.  COMPARISON:  Chest radiograph from earlier today. 02/28/2005 chest CT.  FINDINGS: Cardiovascular: Normal heart size. No significant pericardial fluid/thickening. Left main, left anterior descending and left circumflex coronary atherosclerosis. Atherosclerotic  nonaneurysmal thoracic aorta. Normal caliber pulmonary arteries.  Mediastinum/Nodes: No discrete thyroid nodules. Unremarkable esophagus. No axillary adenopathy. Mild right paratracheal adenopathy measuring up to 1.1 cm (series 3/ image 61), new since 2007. Newly mildly enlarged 1.0 cm subcarinal node (series 3/ image 71). No additional pathologically enlarged mediastinal or gross hilar lymph nodes on this noncontrast scan.  Lungs/Pleura: There is a small (approximately 10%) right pneumothorax, predominantly in the anterior basilar right pleural space. Right lateral chest tube enters the pleural space in the right seventh intercostal space and terminates in the posterior peripheral right mid pleural space at the level of the main pulmonary artery. No left pneumothorax. No pleural effusion. Suture line is noted in the apical left upper lobe. Numerous thin-walled air cysts are scattered throughout both lungs, mildly progressed since 2007, largest in the medial left upper lobe and central lower lobes. There is patchy tree-in-bud opacity with surrounding ground-glass opacity in the dependent right middle lobe, new. There is mild dependent atelectasis in both lower lobes. Otherwise no consolidative airspace disease or significant pulmonary nodules.  Upper abdomen: Cholecystectomy. Left adrenal 2.9 cm mass is stable since 2007, compatible with a benign adenoma.  Musculoskeletal:  No aggressive appearing focal osseous lesions.  IMPRESSION: 1. Small right pneumothorax (approximately 10%). Right chest tube terminates posteriorly in the peripheral right mid pleural space. 2. Scattered thin-walled air cysts throughout both lungs, mildly progressed since 2007, compatible with Birt-Hogg-Dube syndrome. 3. New mild bronchopneumonia in the dependent right middle lobe. 4. Mild dependent atelectasis in the lower lobes. 5. New mild mediastinal lymphadenopathy, nonspecific, probably reactive.  Consider a short-term follow-up chest CT with IV contrast in 3 months. 6. Aortic atherosclerosis. Left main and two-vessel coronary atherosclerosis. 7. Stable left adrenal adenoma.   Electronically Signed   By: Ilona Sorrel M.D.   On: 05/09/2016 10:54    Ct Chest W Contrast  Result Date: 12/05/2016 CLINICAL DATA:  Follow-up pneumothorax EXAM: CT CHEST WITH CONTRAST TECHNIQUE: Multidetector CT imaging of the chest was performed during intravenous contrast administration. CONTRAST:  69mL ISOVUE-300 IOPAMIDOL (ISOVUE-300) INJECTION 61% Creatinine was obtained on site at [facility] at [address] Results: Creatinine 0.9 mg/dL. COMPARISON:  05/09/2016 FINDINGS: Cardiovascular: Atherosclerotic changes are noted within the thoracic aorta without evidence of dissection or aneurysmal dilatation. Coronary calcifications are noted as well. No significant cardiac enlargement is seen. The pulmonary artery as visualized is within normal limits. Mediastinum/Nodes: Thoracic inlet demonstrates some mild heterogeneity within the thyroid gland. No definitive nodule is noted. No significant hilar or mediastinal adenopathy is seen. Previously seen mildly prominent adenopathy has resolved in the interval. The esophagus is within normal limits. Lungs/Pleura: The previously seen right-sided pneumothorax has resolved in the interval. Multiple thin walled air  cysts are again identified throughout both lungs. The overall appearance is stable no focal infiltrate or sizable effusion is noted. Resolution of previously seen right middle lobe changes is noted. No parenchymal nodules are seen. Postsurgical changes are noted in the left apex stable from the prior study. Upper Abdomen: The gallbladder has been surgically removed. Stable lesion in the left adrenal gland is noted consistent with adenoma. The remaining visualized upper abdomen is within normal limits. Musculoskeletal: Degenerative changes of the  thoracic spine are noted. IMPRESSION: Resolution of previously seen right-sided pneumothorax. Multiple thin walled air cysts are again identified stable from the prior exam. Resolution of previous is seen tree-in-bud changes in the right middle lobe. No new acute infiltrate is noted. Resolution of previously seen mediastinal adenopathy. Stable left adrenal adenoma. Aortic Atherosclerosis (ICD10-I70.0) and Emphysema (ICD10-J43.9). Electronically Signed   By: Inez Catalina M.D.   On: 12/05/2016 09:26     I have independently reviewed the above radiology studies  and reviewed the findings with the patient.   Recent Lab Findings: Lab Results  Component Value Date   WBC 6.1 09/11/2016   HGB 17.0 09/11/2016   HCT 49.5 09/11/2016   PLT 214.0 09/11/2016   GLUCOSE 91 09/11/2016   CHOL 157 09/11/2016   TRIG 107.0 09/11/2016   HDL 36.60 (L) 09/11/2016   LDLDIRECT 123.1 10/25/2006   LDLCALC 99 09/11/2016   ALT 29 09/11/2016   AST 18 09/11/2016   NA 141 09/11/2016   K 4.1 09/11/2016   CL 104 09/11/2016   CREATININE 1.00 09/11/2016   BUN 19 09/11/2016   CO2 28 09/11/2016   TSH 5.53 (H) 09/11/2016      Assessment / Plan:      Stable CT of the chest with multiple known pulmonary cysts, bilateral. Resolution of abnormalities of the right middle lobe, mediastinal adenopathy has resolved.  Stable after placement of right chest tube for spontaneous pneumothorax  Plan to see back as needed  Patient continues as a nonsmoker      Ernest Isaac MD      [address] [place], [address] Office [phone]   Beeper [phone]  12/07/2016 2:48 PM

## 2016-12-21 MED FILL — LISINOPRIL 40 MG TABLET: 40 | 90 days supply | Qty: 90 | Fill #0

## 2017-01-08 ENCOUNTER — Telehealth: Payer: Self-pay | Admitting: *Deleted

## 2017-01-08 NOTE — Telephone Encounter (Signed)
Okay to set up appt here with me

## 2017-01-08 NOTE — Telephone Encounter (Signed)
Dr. Raliegh Ip, okay to transfer?

## 2017-01-08 NOTE — Telephone Encounter (Signed)
Copied from [place] [id number]. Topic: General - Other >> Jan 08, 2017  9:05 AM Ernest Hudson wrote:    Pt call to ask if Ernest Hudson will except him as a transfer pt from Ernest Hudson . He said Ernest Hudson Ernest is closer to him would like a call back    [phone]

## 2017-01-08 NOTE — Telephone Encounter (Signed)
Okay for transfer 

## 2017-01-09 ENCOUNTER — Ambulatory Visit: Payer: 59 | Admitting: Adult Health

## 2017-01-09 NOTE — Telephone Encounter (Signed)
Looks like he just needs yearly check up and just had 7/31 Could set up PE in August (not really new patient)

## 2017-01-09 NOTE — Telephone Encounter (Signed)
Do you want patient scheduled in your next new patient slot or sooner?

## 2017-01-09 NOTE — Telephone Encounter (Signed)
I left a message on patient's voice mail.  Created CRM [phone].

## 2017-01-17 ENCOUNTER — Ambulatory Visit: Payer: Self-pay | Admitting: *Deleted

## 2017-01-17 NOTE — Telephone Encounter (Signed)
C/o left a lump in his left groin.   It protrudes out when he stands up and goes away when he sits down.  It is tender and mildly painful into his left testicle.   I made an appt with Dr. Burnice Logan on 01/24/17 at 10:30am.   I instructed him to call if the hernia protrudes out and becomes "stuck" and he can't get it to reduce.   Gave him care instructions on how to reduce the hernia.   Also to call back if it becomes more painful.   He verbalized understanding.   Reason for Disposition . [1] New-onset hernia suspected (reducible bulge in groin or abdomen; non-tender) AND [2] NO pain or vomiting  Answer Assessment - Initial Assessment Questions 1. ONSET:  "When did this first appear?"  Pain on and off for a couple of weeks.   Noticed this morning that there is a lump.  I stand up there is a lump and I sit down and it goes away.     2. APPEARANCE: "What does it look like?"     It too tender to push in.     3. SIZE: "How big is it?" (inches, cm or compare to coins, fruit)     Pinball size  4. LOCATION: "Where exactly is the hernia located?"     Left groin area 5. PATTERN: "Does the swelling come and go, or has it been constant since it started?"     It comes and goes 6. PAIN: "Is there any pain?" If so, ask: "How bad is it?"  (Scale 1-10; or mild, moderate, severe)     On and off.  Pain in left testicle on and off 7. DIAGNOSIS: "Have you been seen by a doctor for this?" "Did the doctor diagnose you as having a hernia?"     No 8. OTHER SYMPTOMS: "Do you have any other symptoms?" (e.g., fever, abdominal pain, vomiting)     No abd pain 9. PREGNANCY: "Is there any chance you are pregnant?" "When was your last menstrual period?"     N/A  Protocols used: HERNIA-A-AH

## 2017-01-24 ENCOUNTER — Encounter: Payer: Self-pay | Admitting: Internal Medicine

## 2017-01-24 ENCOUNTER — Ambulatory Visit (INDEPENDENT_AMBULATORY_CARE_PROVIDER_SITE_OTHER): Payer: 59 | Admitting: Internal Medicine

## 2017-01-24 VITALS — BP 122/72 | HR 60 | Temp 98.7°F | Ht 70.0 in | Wt 196.6 lb

## 2017-01-24 DIAGNOSIS — Z23 Encounter for immunization: Secondary | ICD-10-CM

## 2017-01-24 DIAGNOSIS — K4090 Unilateral inguinal hernia, without obstruction or gangrene, not specified as recurrent: Secondary | ICD-10-CM

## 2017-01-24 NOTE — Progress Notes (Signed)
   Subjective:    Patient ID: Ernest Hudson, male    DOB: [birth year], 61 y.o.   MRN: [id number]  HPI    Review of Systems     Objective:   Physical Exam        Assessment & Plan:

## 2017-01-24 NOTE — Progress Notes (Signed)
Subjective:    Patient ID: Ernest Hudson, male    DOB: Sep 17, 1955, 61 y.o.   MRN: [id number]  HPI 61 year old patient who presents with a one-week history of a painful bulge in the left inguinal area.  This began last week and is aggravated by heavy lifting. He has had an incisional hernia following an appendectomy in the past it was complicated by bowel obstruction and a 10-day hospital stay  Past Medical History:  Diagnosis Date  . 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 Allergic rhinitis   . Anginal pain ([place])   . Arthritis    "probably in my fingers" (04/08/2013)  . Basal cell carcinoma 2000's   'chest"  . Birt-Hogg-Dube syndrome   . Bradycardia   . Dyslipidemia (high LDL; low HDL)   . 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 History of blood transfusion ?1995  . Hx of colonic polyps   . Hyperlipidemia   . Hypertension   . Ileus, postoperative ([place]) 04/17/2013  . Spontaneous pneumothorax 1995     Social History   Socioeconomic History  . Marital status: Married    Spouse name: Not on file  . Number of children: Not on file  . Years of education: Not on file  . Highest education level: Not on file  Social Needs  . Financial resource strain: Not on file  . Food insecurity - worry: Not on file  . Food insecurity - inability: Not on file  . Transportation needs - medical: Not on file  . Transportation needs - non-medical: Not on file  Occupational History  . Not on file  Tobacco Use  . Smoking status: Former Smoker    Packs/day: 2.00    Years: 30.00    Pack years: 60.00    Types: Cigarettes    Last attempt to quit: 07/30/2012    Years since quitting: 4.4  . Smokeless tobacco: Never Used  Substance and Sexual Activity  . Alcohol use: No    Alcohol/week: 0.0 oz  . Drug use: No  . Sexual activity: Not Currently  Other Topics Concern  . Not on file  Social History Narrative  . Not on file    Past Surgical History:  Procedure  Laterality Date  . APPENDECTOMY  04/07/2013  . APPENDECTOMY  04/07/2013   Procedure: OPEN APPENDECTOMY;  Surgeon: Gayland Curry, MD;  Location: [facility];  Service: General;;  . cardiolyte stress  04/2008  . CHOLECYSTECTOMY  2004  . COLONOSCOPY  06/2005  . ELBOW SURGERY Left 2001   "tendonitis"  . EXTERNAL EAR SURGERY  1971  . INCISIONAL HERNIA REPAIR N/A 05/12/2014   Procedure: OPEN REPAIR OF INCISIONAL HERNIA REPAIR;  Surgeon: Greer Pickerel, MD;  [facility];  Service: General;  Laterality: N/A;  . INSERTION OF MESH N/A 05/12/2014   Procedure: WITH INSERTION OF MESH;  Surgeon: Greer Pickerel, MD;  Location[facility];  Service: General;  Laterality: N/A;  . KNEE ARTHROSCOPY Left 1990's  . LAPAROSCOPIC APPENDECTOMY N/A 04/07/2013   Procedure: ATTEMPTED APPENDECTOMY LAPAROSCOPIC;  Surgeon: Gayland Curry, MD;  Location: [facility];  Service: General;  Laterality: N/A;  . LUNG SURGERY     "repaired ruptured bleb" '95  . MIDDLE EAR SURGERY Left 1972   exploratory tympanotomy/notes 07/17/2000  . NASAL SEPTUM SURGERY    . PARTIAL COLECTOMY  04/07/2013   Procedure: PARTIAL COLECTOMY, ILEOCECTOMY;  Surgeon: Gayland Curry, MD;  [facility];  Service: General;;  . SKIN CANCER EXCISION  2000's   "chest"  . TENDON REPAIR Left 1990's   "thumb"    Family History  Problem Relation Age of Onset  . Healthy Sister   . Lung disease Sister        has blebs on MRI,   . Breast cancer Maternal Aunt        diagnosed in her 8s  . Melanoma Maternal Uncle   . Breast cancer Maternal Aunt        diagnosed in her 68s  . Breast cancer Maternal Aunt        diagnosed in her 34s  . Breast cancer Mother   . Diabetes Mother   . Heart attack Father   . Diabetes Father   . Prostate cancer Father   . Healthy Brother   . Healthy Brother   . Cancer Cousin        multiple myeloma  . Colon cancer Neg Hx     Allergies  Allergen Reactions  . Niacin     REACTION: rash    Current Outpatient Medications on File Prior  to Visit  Medication Sig Dispense Refill  . amLODipine (NORVASC) 10 MG tablet Take 1 tablet (10 mg total) by mouth daily. 90 tablet 1  . aspirin EC 81 MG tablet Take 81 mg by mouth every morning.     . cetirizine (ZYRTEC) 10 MG tablet Take 10 mg by mouth every morning.     . fluticasone (FLONASE) 50 MCG/ACT nasal spray Place 2 sprays into both nostrils daily as needed. For allergies. 16 g 6  . ibuprofen (ADVIL,MOTRIN) 200 MG tablet Take 3 tablets (600 mg total) by mouth every 6 (six) hours as needed. You can take 2-3 of these every 6 hours as needed. 20 tablet 0  . lisinopril (PRINIVIL,ZESTRIL) 40 MG tablet Take 1 tablet (40 mg total) by mouth daily. 90 tablet 1  . Multiple Vitamin (MULTIVITAMIN) tablet Take 1 tablet by mouth daily.    . Omega-3 Fatty Acids (FISH OIL PO) Take by mouth.    . Omega-3 Fatty Acids (OMEGA 3 PO) Take by mouth.     Current Facility-Administered Medications on File Prior to Visit  Medication Dose Route Frequency Provider Last Rate Last Dose  . 0.9 %  sodium chloride infusion  500 mL Intravenous Continuous Pyrtle, Lajuan Lines, MD        BP 122/72 (BP Location: Left Arm, Patient Position: Sitting, Cuff Size: Normal)   Pulse 60   Temp 98.7 F (37.1 C) (Oral)   Ht 5' 10"  (1.778 m)   Wt 196 lb 9.6 oz (89.2 kg)   SpO2 96%   BMI 28.21 kg/m      Review of Systems  Constitutional: Negative for appetite change, chills, fatigue and fever.  HENT: Negative for congestion, dental problem, ear pain, hearing loss, sore throat, tinnitus, trouble swallowing and voice change.   Eyes: Negative for pain, discharge and visual disturbance.  Respiratory: Negative for cough, chest tightness, wheezing and stridor.   Cardiovascular: Negative for chest pain, palpitations and leg swelling.  Gastrointestinal: Positive for abdominal pain. Negative for abdominal distention, blood in stool, constipation, diarrhea, nausea and vomiting.  Genitourinary: Negative for difficulty urinating,  discharge, flank pain, genital sores, hematuria and urgency.  Musculoskeletal: Negative for arthralgias, back pain, gait problem, joint swelling, myalgias and neck stiffness.  Skin: Negative for rash.  Neurological: Negative for dizziness, syncope, speech difficulty, weakness, numbness and  headaches.  Hematological: Negative for adenopathy. Does not bruise/bleed easily.  Psychiatric/Behavioral: Negative for behavioral problems and dysphoric mood. The patient is not nervous/anxious.        Objective:   Physical Exam  Constitutional: He appears well-developed and well-nourished. No distress.  Abdominal:  Easily reducible left inguinal hernia          Assessment & Plan:   Reducible symptomatic left inguinal hernia.  Patient has had mild symptoms for the past week we will clinically observe at this time if symptoms persist or worsen will need general surgical referral

## 2017-01-24 NOTE — Patient Instructions (Addendum)

## 2017-02-26 ENCOUNTER — Ambulatory Visit: Payer: Self-pay | Admitting: Family

## 2017-02-26 VITALS — BP 138/67 | HR 84 | Temp 98.4°F

## 2017-02-26 DIAGNOSIS — J019 Acute sinusitis, unspecified: Secondary | ICD-10-CM

## 2017-02-26 DIAGNOSIS — B9689 Other specified bacterial agents as the cause of diseases classified elsewhere: Secondary | ICD-10-CM

## 2017-02-26 MED ORDER — AMOXICILLIN-POT CLAVULANATE 875-125 MG PO TABS: 1.0000 | ORAL_TABLET | Freq: Two times a day (BID) | ORAL | 0 refills | Status: DC

## 2017-02-26 MED ORDER — PREDNISONE 20 MG PO TABS: 40.0000 mg | ORAL_TABLET | Freq: Every day | ORAL | 0 refills | Status: DC

## 2017-02-26 NOTE — Patient Instructions (Signed)

## 2017-02-26 NOTE — Progress Notes (Signed)
Subjective:     Patient ID: Ernest Hudson, male   DOB: 1955/07/21, 62 y.o.   MRN: [id number]  HPI 62 year old WM, former smoker presents today with worsening, cough, congestion, sinus pressure and pain in the teeth x 1 week. Has been taking OTC Ibuprofen and a sinus medication that has helped minimally.   Review of Systems  Constitutional: Positive for chills, fatigue and fever. Negative for appetite change.  HENT: Positive for ear pain, postnasal drip, sinus pressure, sinus pain and sneezing.   Respiratory: Positive for cough. Negative for wheezing.   Gastrointestinal: Negative.   Skin: Negative.   Neurological: Negative.   Psychiatric/Behavioral: Negative.    Past Medical History:  Diagnosis Date  . 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 Allergic rhinitis   . Anginal pain ([place])   . Arthritis    "probably in my fingers" (04/08/2013)  . Basal cell carcinoma 2000's   'chest"  . Birt-Hogg-Dube syndrome   . Bradycardia   . Dyslipidemia (high LDL; low HDL)   . 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 History of blood transfusion ?1995  . Hx of colonic polyps   . Hyperlipidemia   . Hypertension   . Ileus, postoperative ([place]) 04/17/2013  . Spontaneous pneumothorax 1995    Social History   Socioeconomic History  . Marital status: Married    Spouse name: Not on file  . Number of children: Not on file  . Years of education: Not on file  . Highest education level: Not on file  Social Needs  . Financial resource strain: Not on file  . Food insecurity - worry: Not on file  . Food insecurity - inability: Not on file  . Transportation needs - medical: Not on file  . Transportation needs - non-medical: Not on file  Occupational History  . Not on file  Tobacco Use  . Smoking status: Former Smoker    Packs/day: 2.00    Years: 30.00    Pack years: 60.00    Types: Cigarettes    Last attempt to quit: 07/30/2012    Years since quitting: 4.5  . Smokeless  tobacco: Never Used  Substance and Sexual Activity  . Alcohol use: No    Alcohol/week: 0.0 oz  . Drug use: No  . Sexual activity: Not Currently  Other Topics Concern  . Not on file  Social History Narrative  . Not on file    Past Surgical History:  Procedure Laterality Date  . APPENDECTOMY  04/07/2013  . APPENDECTOMY  04/07/2013   Procedure: OPEN APPENDECTOMY;  Surgeon: Gayland Curry, MD;  Location: [facility];  Service: General;;  . cardiolyte stress  04/2008  . CHOLECYSTECTOMY  2004  . COLONOSCOPY  06/2005  . ELBOW SURGERY Left 2001   "tendonitis"  . EXTERNAL EAR SURGERY  1971  . INCISIONAL HERNIA REPAIR N/A 05/12/2014   Procedure: OPEN REPAIR OF INCISIONAL HERNIA REPAIR;  Surgeon: Greer Pickerel, MD;  [facility];  Service: General;  Laterality: N/A;  . INSERTION OF MESH N/A 05/12/2014   Procedure: WITH INSERTION OF MESH;  Surgeon: Greer Pickerel, MD;  [facility];  Service: General;  Laterality: N/A;  . KNEE ARTHROSCOPY Left 1990's  . LAPAROSCOPIC APPENDECTOMY N/A 04/07/2013   Procedure: ATTEMPTED APPENDECTOMY LAPAROSCOPIC;  Surgeon: Gayland Curry, MD;  Location: [facility];  Service: General;  Laterality: N/A;  . LUNG SURGERY     "repaired  ruptured bleb" '95  . MIDDLE EAR SURGERY Left 1972   exploratory tympanotomy/notes 07/17/2000  . NASAL SEPTUM SURGERY    . PARTIAL COLECTOMY  04/07/2013   Procedure: PARTIAL COLECTOMY, ILEOCECTOMY;  Surgeon: Gayland Curry, MD;  Location: [facility];  Service: General;;  . SKIN CANCER EXCISION  2000's   "chest"  . TENDON REPAIR Left 1990's   "thumb"    Family History  Problem Relation Age of Onset  . Healthy Sister   . Lung disease Sister        has blebs on MRI,   . Breast cancer Maternal Aunt        diagnosed in her 91s  . Melanoma Maternal Uncle   . Breast cancer Maternal Aunt        diagnosed in her 27s  . Breast cancer Maternal Aunt        diagnosed in her 54s  . Breast cancer Mother   . Diabetes Mother   . Heart attack Father   .  Diabetes Father   . Prostate cancer Father   . Healthy Brother   . Healthy Brother   . Cancer Cousin        multiple myeloma  . Colon cancer Neg Hx     Allergies  Allergen Reactions  . Lipitor [Atorvastatin]   . Niacin     REACTION: rash    Current Outpatient Medications on File Prior to Visit  Medication Sig Dispense Refill  . amLODipine (NORVASC) 10 MG tablet Take 1 tablet (10 mg total) by mouth daily. 90 tablet 1  . aspirin EC 81 MG tablet Take 81 mg by mouth every morning.     . cetirizine (ZYRTEC) 10 MG tablet Take 10 mg by mouth every morning.     . fluticasone (FLONASE) 50 MCG/ACT nasal spray Place 2 sprays into both nostrils daily as needed. For allergies. 16 g 6  . ibuprofen (ADVIL,MOTRIN) 200 MG tablet Take 3 tablets (600 mg total) by mouth every 6 (six) hours as needed. You can take 2-3 of these every 6 hours as needed. 20 tablet 0  . lisinopril (PRINIVIL,ZESTRIL) 40 MG tablet Take 1 tablet (40 mg total) by mouth daily. 90 tablet 1  . Multiple Vitamin (MULTIVITAMIN) tablet Take 1 tablet by mouth daily.    . Omega-3 Fatty Acids (FISH OIL PO) Take by mouth.    . Omega-3 Fatty Acids (OMEGA 3 PO) Take by mouth.     Current Facility-Administered Medications on File Prior to Visit  Medication Dose Route Frequency Provider Last Rate Last Dose  . 0.9 %  sodium chloride infusion  500 mL Intravenous Continuous Pyrtle, Lajuan Lines, MD        BP 138/67   Pulse 84   Temp 98.4 F (36.9 C)   SpO2 97% chart     Objective:   Physical Exam  Constitutional: He is oriented to person, place, and time. He appears well-developed and well-nourished.  HENT:  Right Ear: External ear normal.  Left Ear: External ear normal.  Nose: Nose normal.  Mouth/Throat: Oropharynx is clear and moist.  Maxillary sinus tenderness bilat  Neck: Normal range of motion. Neck supple.  Cardiovascular: Normal rate and regular rhythm.  Pulmonary/Chest: Effort normal and breath sounds normal.  Musculoskeletal:  Normal range of motion.  Neurological: He is alert and oriented to person, place, and time.  Skin: Skin is warm and dry.  Psychiatric: He has a normal mood and affect.  Assessment:     Ernest Hudson was seen today for save-Shafter-sinus inf.  Diagnoses and all orders for this visit:  Acute bacterial sinusitis  Other orders -     amoxicillin-clavulanate (AUGMENTIN) 875-125 MG tablet; Take 1 tablet by mouth 2 (two) times daily. -     predniSONE (DELTASONE) 20 MG tablet; Take 2 tablets (40 mg total) by mouth daily with breakfast.      Plan:     RTC if symptoms worsen or persist.

## 2017-03-18 MED FILL — AMLODIPINE BESYLATE 10 MG T: 10 | 90 days supply | Qty: 90 | Fill #1

## 2017-03-26 DIAGNOSIS — Q8789 Other specified congenital malformation syndromes, not elsewhere classified: Secondary | ICD-10-CM | POA: Diagnosis not present

## 2017-03-26 DIAGNOSIS — L400 Psoriasis vulgaris: Secondary | ICD-10-CM | POA: Diagnosis not present

## 2017-03-26 DIAGNOSIS — Z23 Encounter for immunization: Secondary | ICD-10-CM | POA: Diagnosis not present

## 2017-03-26 DIAGNOSIS — Z79899 Other long term (current) drug therapy: Secondary | ICD-10-CM | POA: Diagnosis not present

## 2017-03-26 MED FILL — CLOBETASOL PROPIONATE 0.05: 0.05 | 10 days supply | Qty: 15 | Fill #0

## 2017-04-25 MED FILL — CLOBETASOL PROPIONATE 0.05: 0.05 | 10 days supply | Qty: 15 | Fill #0

## 2017-04-30 ENCOUNTER — Ambulatory Visit (INDEPENDENT_AMBULATORY_CARE_PROVIDER_SITE_OTHER): Payer: 59 | Admitting: Internal Medicine

## 2017-04-30 ENCOUNTER — Encounter: Payer: Self-pay | Admitting: Internal Medicine

## 2017-04-30 VITALS — BP 130/86 | HR 70 | Temp 98.2°F | Wt 187.8 lb

## 2017-04-30 DIAGNOSIS — I10 Essential (primary) hypertension: Secondary | ICD-10-CM | POA: Diagnosis not present

## 2017-04-30 DIAGNOSIS — Z01810 Encounter for preprocedural cardiovascular examination: Secondary | ICD-10-CM | POA: Diagnosis not present

## 2017-04-30 DIAGNOSIS — K4090 Unilateral inguinal hernia, without obstruction or gangrene, not specified as recurrent: Secondary | ICD-10-CM

## 2017-04-30 NOTE — Patient Instructions (Addendum)
Inguinal Hernia, Adult An inguinal hernia is when fat or the intestines push through the area where the leg meets the lower belly (groin) and make a rounded lump (bulge). This condition happens over time. There are three types of inguinal hernias. These types include:  Hernias that can be pushed back into the belly (are reducible).  Hernias that cannot be pushed back into the belly (are incarcerated).  Hernias that cannot be pushed back into the belly and lose their blood supply (get strangulated). This type needs emergency surgery.  Follow these instructions at home: Lifestyle  Drink enough fluid to keep your urine (pee) clear or pale yellow.  Eat plenty of fruits, vegetables, and whole grains. These have a lot of fiber. Talk with your doctor if you have questions.  Avoid lifting heavy objects.  Avoid standing for long periods of time.  Do not use tobacco products. These include cigarettes, chewing tobacco, or e-cigarettes. If you need help quitting, ask your doctor.  Try to stay at a healthy weight. General instructions  Do not try to force the hernia back in.  Watch your hernia for any changes in color or size. Let your doctor know if there are any changes.  Take over-the-counter and prescription medicines only as told by your doctor.  Keep all follow-up visits as told by your doctor. This is important. Contact a doctor if:  You have a fever.  You have new symptoms.  Your symptoms get worse. Get help right away if:  The area where the legs meets the lower belly has: ? Pain that gets worse suddenly. ? A bulge that gets bigger suddenly and does not go down. ? A bulge that turns red or purple. ? A bulge that is painful to the touch.  You are a man and your scrotum: ? Suddenly feels painful. ? Suddenly changes in size.  You feel sick to your stomach (nauseous) and this feeling does not go away.  You throw up (vomit) and this keeps happening.  You feel your heart  beating a lot more quickly than normal.  You cannot poop (have a bowel movement) or pass gas. This information is not intended to replace advice given to you by your health care provider. Make sure you discuss any questions you have with your health care provider. Document Released: 03/09/2006 Document Revised: 07/15/2015 Document Reviewed: 12/17/2013 Elsevier Interactive Patient Education  2018 [employer]vier Inc[employer]  

## 2017-04-30 NOTE — Progress Notes (Signed)
Subjective:    Patient ID: Ernest Hudson, male    DOB: [birth year], 62 y.o.   MRN: [id number]  HPI  62 year old patient who is seen today with a complaint of a painful left inguinal hernia.  He first noted symptoms in the fall and was seen here in December with only mild intermittent pain.  Pain has become more severe and is described as a significant burning pain occasionally lasting throughout the day.  Pain interferes with work.  He is quite active as a Psychologist, sport and exercise. He has had an appendectomy in the past and required treatment for an incisional hernia.  Postoperative complications included a significant ileus causing a prolonged hospital stay.  Patient otherwise has been clinically stable and denies any cardiopulmonary complaints  He is followed by dermatology with a history of psoriasis  Past Medical History:  Diagnosis Date  . 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 Allergic rhinitis   . Anginal pain ([place])   . Arthritis    "probably in my fingers" (04/08/2013)  . Basal cell carcinoma 2000's   'chest"  . Birt-Hogg-Dube syndrome   . Bradycardia   . Dyslipidemia (high LDL; low HDL)   . 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 History of blood transfusion ?1995  . Hx of colonic polyps   . Hyperlipidemia   . Hypertension   . Ileus, postoperative ([place]) 04/17/2013  . Spontaneous pneumothorax 1995     Social History   Socioeconomic History  . Marital status: Married    Spouse name: Not on file  . Number of children: Not on file  . Years of education: Not on file  . Highest education level: Not on file  Social Needs  . Financial resource strain: Not on file  . Food insecurity - worry: Not on file  . Food insecurity - inability: Not on file  . Transportation needs - medical: Not on file  . Transportation needs - non-medical: Not on file  Occupational History  . Not on file  Tobacco Use  . Smoking status: Former Smoker    Packs/day: 2.00    Years:  30.00    Pack years: 60.00    Types: Cigarettes    Last attempt to quit: 07/30/2012    Years since quitting: 4.7  . Smokeless tobacco: Never Used  Substance and Sexual Activity  . Alcohol use: No    Alcohol/week: 0.0 oz  . Drug use: No  . Sexual activity: Not Currently  Other Topics Concern  . Not on file  Social History Narrative  . Not on file    Past Surgical History:  Procedure Laterality Date  . APPENDECTOMY  04/07/2013  . APPENDECTOMY  04/07/2013   Procedure: OPEN APPENDECTOMY;  Surgeon: Gayland Curry, MD;  Location: [facility];  Service: General;;  . cardiolyte stress  04/2008  . CHOLECYSTECTOMY  2004  . COLONOSCOPY  06/2005  . ELBOW SURGERY Left 2001   "tendonitis"  . EXTERNAL EAR SURGERY  1971  . INCISIONAL HERNIA REPAIR N/A 05/12/2014   Procedure: OPEN REPAIR OF INCISIONAL HERNIA REPAIR;  Surgeon: Greer Pickerel, MD;  [facility];  Service: General;  Laterality: N/A;  . INSERTION OF MESH N/A 05/12/2014   Procedure: WITH INSERTION OF MESH;  Surgeon: Greer Pickerel, MD;  [facility];  Service: General;  Laterality: N/A;  . KNEE ARTHROSCOPY Left 1990's  . LAPAROSCOPIC APPENDECTOMY N/A 04/07/2013   Procedure: ATTEMPTED APPENDECTOMY  LAPAROSCOPIC;  Surgeon: Gayland Curry, MD;  Location: [facility];  Service: General;  Laterality: N/A;  . LUNG SURGERY     "repaired ruptured bleb" '95  . MIDDLE EAR SURGERY Left 1972   exploratory tympanotomy/notes 07/17/2000  . NASAL SEPTUM SURGERY    . PARTIAL COLECTOMY  04/07/2013   Procedure: PARTIAL COLECTOMY, ILEOCECTOMY;  Surgeon: Gayland Curry, MD;  Location: [facility];  Service: General;;  . SKIN CANCER EXCISION  2000's   "chest"  . TENDON REPAIR Left 1990's   "thumb"    Family History  Problem Relation Age of Onset  . Healthy Sister   . Lung disease Sister        has blebs on MRI,   . Breast cancer Maternal Aunt        diagnosed in her 78s  . Melanoma Maternal Uncle   . Breast cancer Maternal Aunt        diagnosed in her 31s  . Breast  cancer Maternal Aunt        diagnosed in her 76s  . Breast cancer Mother   . Diabetes Mother   . Heart attack Father   . Diabetes Father   . Prostate cancer Father   . Healthy Brother   . Healthy Brother   . Cancer Cousin        multiple myeloma  . Colon cancer Neg Hx     Allergies  Allergen Reactions  . Lipitor [Atorvastatin]   . Niacin     REACTION: rash    Current Outpatient Medications on File Prior to Visit  Medication Sig Dispense Refill  . amLODipine (NORVASC) 10 MG tablet Take 1 tablet (10 mg total) by mouth daily. 90 tablet 1  . aspirin EC 81 MG tablet Take 81 mg by mouth every morning.     . cetirizine (ZYRTEC) 10 MG tablet Take 10 mg by mouth every morning.     . clobetasol cream (TEMOVATE) 0.05 %   0  . fluticasone (FLONASE) 50 MCG/ACT nasal spray Place 2 sprays into both nostrils daily as needed. For allergies. 16 g 6  . ibuprofen (ADVIL,MOTRIN) 200 MG tablet Take 3 tablets (600 mg total) by mouth every 6 (six) hours as needed. You can take 2-3 of these every 6 hours as needed. 20 tablet 0  . Multiple Vitamin (MULTIVITAMIN) tablet Take 1 tablet by mouth daily.    . Omega-3 Fatty Acids (FISH OIL PO) Take by mouth.    . Omega-3 Fatty Acids (OMEGA 3 PO) Take by mouth.    Marland Kitchen lisinopril (PRINIVIL,ZESTRIL) 40 MG tablet Take 1 tablet (40 mg total) by mouth daily. (Patient not taking: Reported on 04/30/2017) 90 tablet 1   Current Facility-Administered Medications on File Prior to Visit  Medication Dose Route Frequency Provider Last Rate Last Dose  . 0.9 %  sodium chloride infusion  500 mL Intravenous Continuous Pyrtle, Lajuan Lines, MD        BP 130/86 (BP Location: Left Arm, Patient Position: Sitting, Cuff Size: Normal)   Pulse 70   Temp 98.2 F (36.8 C) (Oral)   Wt 187 lb 12.8 oz (85.2 kg)   SpO2 98%   BMI 26.95 kg/m     Review of Systems  Constitutional: Negative for appetite change, chills, fatigue and fever.  HENT: Negative for congestion, dental problem, ear  pain, hearing loss, sore throat, tinnitus, trouble swallowing and voice change.   Eyes: Negative for pain, discharge and visual disturbance.  Respiratory: Negative for  cough, chest tightness, wheezing and stridor.   Cardiovascular: Negative for chest pain, palpitations and leg swelling.  Gastrointestinal: Positive for abdominal distention and abdominal pain. Negative for blood in stool, constipation, diarrhea, nausea and vomiting.  Genitourinary: Negative for difficulty urinating, discharge, flank pain, genital sores, hematuria and urgency.  Musculoskeletal: Negative for arthralgias, back pain, gait problem, joint swelling, myalgias and neck stiffness.  Skin: Negative for rash.  Neurological: Negative for dizziness, syncope, speech difficulty, weakness, numbness and headaches.  Hematological: Negative for adenopathy. Does not bruise/bleed easily.  Psychiatric/Behavioral: Negative for behavioral problems and dysphoric mood. The patient is not nervous/anxious.        Objective:   Physical Exam  Constitutional: He is oriented to person, place, and time. He appears well-developed.  HENT:  Head: Normocephalic.  Right Ear: External ear normal.  Left Ear: External ear normal.  Eyes: Conjunctivae and EOM are normal.  Neck: Normal range of motion.  Cardiovascular: Normal rate and normal heart sounds.  Pulmonary/Chest: Breath sounds normal.  Abdominal: Bowel sounds are normal.  Moderate size reducible left inguinal hernia  Musculoskeletal: Normal range of motion. He exhibits no edema or tenderness.  Neurological: He is alert and oriented to person, place, and time.  Psychiatric: He has a normal mood and affect. His behavior is normal.          Assessment & Plan:   Moderate symptomatic left inguinal hernia.  We will set up for surgical consultation and elective hernia repair Essential hypertension stable History of incisional hernia and postop ileus  [name]

## 2017-05-10 DIAGNOSIS — K4090 Unilateral inguinal hernia, without obstruction or gangrene, not specified as recurrent: Secondary | ICD-10-CM | POA: Diagnosis not present

## 2017-05-10 DIAGNOSIS — N401 Enlarged prostate with lower urinary tract symptoms: Secondary | ICD-10-CM | POA: Diagnosis not present

## 2017-05-10 DIAGNOSIS — R351 Nocturia: Secondary | ICD-10-CM | POA: Diagnosis not present

## 2017-05-10 MED FILL — TAMSULOSIN HCL 0.4 MG CAP: 0.4 | 30 days supply | Qty: 30 | Fill #0

## 2017-05-14 ENCOUNTER — Ambulatory Visit: Payer: Self-pay | Admitting: General Surgery

## 2017-05-23 ENCOUNTER — Encounter (HOSPITAL_BASED_OUTPATIENT_CLINIC_OR_DEPARTMENT_OTHER): Payer: Self-pay | Admitting: *Deleted

## 2017-05-23 ENCOUNTER — Other Ambulatory Visit: Payer: Self-pay

## 2017-05-25 ENCOUNTER — Other Ambulatory Visit: Payer: Self-pay

## 2017-05-25 ENCOUNTER — Encounter (HOSPITAL_BASED_OUTPATIENT_CLINIC_OR_DEPARTMENT_OTHER)
Admission: RE | Admit: 2017-05-25 | Discharge: 2017-05-25 | Disposition: A | Discharge status: Home / Self Care | Payer: 59 | Source: Ambulatory Visit | Attending: General Surgery | Admitting: General Surgery

## 2017-05-25 DIAGNOSIS — I10 Essential (primary) hypertension: Secondary | ICD-10-CM | POA: Insufficient documentation

## 2017-05-25 DIAGNOSIS — Z01810 Encounter for preprocedural cardiovascular examination: Secondary | ICD-10-CM | POA: Diagnosis not present

## 2017-05-25 DIAGNOSIS — R001 Bradycardia, unspecified: Secondary | ICD-10-CM | POA: Insufficient documentation

## 2017-05-25 NOTE — Progress Notes (Signed)
Ensure pre surgery drink given with instructions to complete by 11:15 dos, pt verbalized understanding. 

## 2017-05-31 ENCOUNTER — Ambulatory Visit (HOSPITAL_BASED_OUTPATIENT_CLINIC_OR_DEPARTMENT_OTHER)
Admission: RE | Admit: 2017-05-31 | Discharge: 2017-05-31 | Disposition: A | Discharge status: Home / Self Care | Payer: 59 | Source: Ambulatory Visit | Attending: General Surgery | Admitting: General Surgery

## 2017-05-31 ENCOUNTER — Other Ambulatory Visit: Payer: Self-pay

## 2017-05-31 ENCOUNTER — Encounter (HOSPITAL_BASED_OUTPATIENT_CLINIC_OR_DEPARTMENT_OTHER): Payer: Self-pay | Admitting: *Deleted

## 2017-05-31 ENCOUNTER — Encounter (HOSPITAL_BASED_OUTPATIENT_CLINIC_OR_DEPARTMENT_OTHER)
Admission: RE | Disposition: A | Discharge status: Home / Self Care | Payer: Self-pay | Source: Ambulatory Visit | Attending: General Surgery

## 2017-05-31 ENCOUNTER — Ambulatory Visit (HOSPITAL_BASED_OUTPATIENT_CLINIC_OR_DEPARTMENT_OTHER): Payer: 59 | Admitting: Anesthesiology

## 2017-05-31 DIAGNOSIS — R351 Nocturia: Secondary | ICD-10-CM | POA: Diagnosis not present

## 2017-05-31 DIAGNOSIS — D176 Benign lipomatous neoplasm of spermatic cord: Secondary | ICD-10-CM | POA: Diagnosis not present

## 2017-05-31 DIAGNOSIS — Z87891 Personal history of nicotine dependence: Secondary | ICD-10-CM | POA: Diagnosis not present

## 2017-05-31 DIAGNOSIS — N401 Enlarged prostate with lower urinary tract symptoms: Secondary | ICD-10-CM | POA: Insufficient documentation

## 2017-05-31 DIAGNOSIS — K4090 Unilateral inguinal hernia, without obstruction or gangrene, not specified as recurrent: Secondary | ICD-10-CM | POA: Insufficient documentation

## 2017-05-31 DIAGNOSIS — Z79899 Other long term (current) drug therapy: Secondary | ICD-10-CM | POA: Insufficient documentation

## 2017-05-31 DIAGNOSIS — I10 Essential (primary) hypertension: Secondary | ICD-10-CM | POA: Diagnosis not present

## 2017-05-31 DIAGNOSIS — Z7982 Long term (current) use of aspirin: Secondary | ICD-10-CM | POA: Insufficient documentation

## 2017-05-31 DIAGNOSIS — G8918 Other acute postprocedural pain: Secondary | ICD-10-CM | POA: Diagnosis not present

## 2017-05-31 DIAGNOSIS — Z888 Allergy status to other drugs, medicaments and biological substances status: Secondary | ICD-10-CM | POA: Diagnosis not present

## 2017-05-31 DIAGNOSIS — M1990 Unspecified osteoarthritis, unspecified site: Secondary | ICD-10-CM | POA: Diagnosis not present

## 2017-05-31 DIAGNOSIS — E785 Hyperlipidemia, unspecified: Secondary | ICD-10-CM | POA: Diagnosis not present

## 2017-05-31 HISTORY — PX: INGUINAL HERNIA REPAIR: SHX194

## 2017-05-31 HISTORY — PX: INSERTION OF MESH: SHX5868

## 2017-05-31 SURGERY — REPAIR, HERNIA, INGUINAL, ADULT
Anesthesia: General | Site: Inguinal | Laterality: Left

## 2017-05-31 MED ORDER — PROPOFOL 10 MG/ML IV BOLUS
INTRAVENOUS | Status: AC
  Filled 2017-05-31: qty 20

## 2017-05-31 MED ORDER — BUPIVACAINE-EPINEPHRINE 0.25% -1:200000 IJ SOLN
INTRAMUSCULAR | Status: DC | PRN
  Administered 2017-05-31: 10 mL

## 2017-05-31 MED ORDER — GABAPENTIN 300 MG PO CAPS
ORAL_CAPSULE | ORAL | Status: AC
  Filled 2017-05-31: qty 1

## 2017-05-31 MED ORDER — MIDAZOLAM HCL 2 MG/2ML IJ SOLN
INTRAMUSCULAR | Status: AC
  Filled 2017-05-31: qty 2

## 2017-05-31 MED ORDER — CLONIDINE HCL (ANALGESIA) 100 MCG/ML EP SOLN
EPIDURAL | Status: DC | PRN
  Administered 2017-05-31: 50 ug

## 2017-05-31 MED ORDER — GLYCOPYRROLATE 0.2 MG/ML IJ SOLN
INTRAMUSCULAR | Status: DC | PRN
  Administered 2017-05-31: 0.2 mg via INTRAVENOUS

## 2017-05-31 MED ORDER — PROMETHAZINE HCL 25 MG/ML IJ SOLN: 6.2500 mg | INTRAMUSCULAR | Status: DC | PRN

## 2017-05-31 MED ORDER — EPHEDRINE SULFATE 50 MG/ML IJ SOLN
INTRAMUSCULAR | Status: DC | PRN
  Administered 2017-05-31: 10 mg via INTRAVENOUS

## 2017-05-31 MED ORDER — ACETAMINOPHEN 500 MG PO TABS
ORAL_TABLET | ORAL | Status: AC
  Filled 2017-05-31: qty 2

## 2017-05-31 MED ORDER — FENTANYL CITRATE (PF) 100 MCG/2ML IJ SOLN
25.0000 ug | INTRAMUSCULAR | Status: DC | PRN
  Administered 2017-05-31 (×2): 50 ug via INTRAVENOUS

## 2017-05-31 MED ORDER — FENTANYL CITRATE (PF) 100 MCG/2ML IJ SOLN
INTRAMUSCULAR | Status: AC
  Filled 2017-05-31: qty 2

## 2017-05-31 MED ORDER — SUGAMMADEX SODIUM 200 MG/2ML IV SOLN
INTRAVENOUS | Status: DC | PRN
  Administered 2017-05-31: 200 mg via INTRAVENOUS

## 2017-05-31 MED ORDER — GABAPENTIN 300 MG PO CAPS
300.0000 mg | ORAL_CAPSULE | ORAL | Status: AC
  Administered 2017-05-31: 300 mg via ORAL

## 2017-05-31 MED ORDER — ACETAMINOPHEN 500 MG PO TABS
1000.0000 mg | ORAL_TABLET | ORAL | Status: AC
  Administered 2017-05-31: 1000 mg via ORAL

## 2017-05-31 MED ORDER — ONDANSETRON HCL 4 MG/2ML IJ SOLN
INTRAMUSCULAR | Status: DC | PRN
  Administered 2017-05-31: 4 mg via INTRAVENOUS

## 2017-05-31 MED ORDER — CHLORHEXIDINE GLUCONATE 4 % EX LIQD: 60.0000 mL | Freq: Once | CUTANEOUS | Status: DC

## 2017-05-31 MED ORDER — MIDAZOLAM HCL 2 MG/2ML IJ SOLN
1.0000 mg | INTRAMUSCULAR | Status: DC | PRN
  Administered 2017-05-31: 2 mg via INTRAVENOUS

## 2017-05-31 MED ORDER — OXYCODONE HCL 5 MG PO TABS
5.0000 mg | ORAL_TABLET | Freq: Four times a day (QID) | ORAL | 0 refills | Status: DC | PRN
Start: 2017-05-31 — End: 2017-10-17

## 2017-05-31 MED ORDER — ROPIVACAINE HCL 7.5 MG/ML IJ SOLN
INTRAMUSCULAR | Status: DC | PRN
  Administered 2017-05-31: 30 mL via PERINEURAL

## 2017-05-31 MED ORDER — DEXAMETHASONE SODIUM PHOSPHATE 4 MG/ML IJ SOLN
INTRAMUSCULAR | Status: DC | PRN
  Administered 2017-05-31: 10 mg via INTRAVENOUS

## 2017-05-31 MED ORDER — LACTATED RINGERS IV SOLN
INTRAVENOUS | Status: DC
  Administered 2017-05-31 (×2): via INTRAVENOUS

## 2017-05-31 MED ORDER — KETOROLAC TROMETHAMINE 30 MG/ML IJ SOLN
INTRAMUSCULAR | Status: DC | PRN
  Administered 2017-05-31: 30 mg via INTRAVENOUS

## 2017-05-31 MED ORDER — CEFAZOLIN SODIUM-DEXTROSE 2-4 GM/100ML-% IV SOLN
2.0000 g | INTRAVENOUS | Status: AC
  Administered 2017-05-31: 2 g via INTRAVENOUS

## 2017-05-31 MED ORDER — SCOPOLAMINE 1 MG/3DAYS TD PT72: 1.0000 | MEDICATED_PATCH | Freq: Once | TRANSDERMAL | Status: DC | PRN

## 2017-05-31 MED ORDER — LIDOCAINE HCL (CARDIAC) 20 MG/ML IV SOLN
INTRAVENOUS | Status: AC
  Filled 2017-05-31: qty 5

## 2017-05-31 MED ORDER — ROCURONIUM BROMIDE 100 MG/10ML IV SOLN
INTRAVENOUS | Status: DC | PRN
  Administered 2017-05-31: 50 mg via INTRAVENOUS

## 2017-05-31 MED ORDER — FENTANYL CITRATE (PF) 100 MCG/2ML IJ SOLN
50.0000 ug | INTRAMUSCULAR | Status: DC | PRN
  Administered 2017-05-31: 100 ug via INTRAVENOUS

## 2017-05-31 MED ORDER — MEPERIDINE HCL 25 MG/ML IJ SOLN: 6.2500 mg | INTRAMUSCULAR | Status: DC | PRN

## 2017-05-31 MED ORDER — CEFAZOLIN SODIUM-DEXTROSE 2-4 GM/100ML-% IV SOLN
INTRAVENOUS | Status: AC
  Filled 2017-05-31: qty 100

## 2017-05-31 MED ORDER — PROPOFOL 10 MG/ML IV BOLUS
INTRAVENOUS | Status: DC | PRN
  Administered 2017-05-31: 150 mg via INTRAVENOUS

## 2017-05-31 MED ORDER — LIDOCAINE 2% (20 MG/ML) 5 ML SYRINGE
INTRAMUSCULAR | Status: DC | PRN
  Administered 2017-05-31: 100 mg via INTRAVENOUS

## 2017-05-31 SURGICAL SUPPLY — 47 items
ADH SKN CLS APL DERMABOND .7 (GAUZE/BANDAGES/DRESSINGS)
APL SKNCLS STERI-STRIP NONHPOA (GAUZE/BANDAGES/DRESSINGS) ×1
BENZOIN TINCTURE PRP APPL 2/3 (GAUZE/BANDAGES/DRESSINGS) ×2 IMPLANT
BLADE CLIPPER SURG (BLADE) ×2 IMPLANT
BLADE SURG 15 STRL LF DISP TIS (BLADE) ×1 IMPLANT
BLADE SURG 15 STRL SS (BLADE) ×3
CHLORAPREP W/TINT 26ML (MISCELLANEOUS) ×3 IMPLANT
CLOSURE STERI-STRIP 1/2X4 (GAUZE/BANDAGES/DRESSINGS) ×1
CLOSURE WOUND 1/2 X4 (GAUZE/BANDAGES/DRESSINGS) ×1
CLSR STERI-STRIP ANTIMIC 1/2X4 (GAUZE/BANDAGES/DRESSINGS) ×1 IMPLANT
COVER BACK TABLE 60X90IN (DRAPES) ×3 IMPLANT
COVER MAYO STAND STRL (DRAPES) ×3 IMPLANT
DECANTER SPIKE VIAL GLASS SM (MISCELLANEOUS) IMPLANT
DERMABOND ADVANCED (GAUZE/BANDAGES/DRESSINGS)
DERMABOND ADVANCED .7 DNX12 (GAUZE/BANDAGES/DRESSINGS) IMPLANT
DRAIN PENROSE 1/2X12 LTX STRL (WOUND CARE) ×3 IMPLANT
DRAPE LAPAROTOMY 100X72 PEDS (DRAPES) ×3 IMPLANT
DRAPE UTILITY XL STRL (DRAPES) ×3 IMPLANT
DRSG TEGADERM 4X4.75 (GAUZE/BANDAGES/DRESSINGS) ×2 IMPLANT
ELECT COATED BLADE 2.86 ST (ELECTRODE) ×3 IMPLANT
ELECT REM PT RETURN 9FT ADLT (ELECTROSURGICAL) ×3
ELECTRODE REM PT RTRN 9FT ADLT (ELECTROSURGICAL) ×1 IMPLANT
GAUZE SPONGE 4X4 12PLY STRL (GAUZE/BANDAGES/DRESSINGS) ×2 IMPLANT
GAUZE SPONGE 4X4 12PLY STRL LF (GAUZE/BANDAGES/DRESSINGS) ×3 IMPLANT
GLOVE BIO SURGEON STRL SZ7.5 (GLOVE) ×3 IMPLANT
GLOVE BIOGEL PI IND STRL 8 (GLOVE) ×1 IMPLANT
GLOVE BIOGEL PI INDICATOR 8 (GLOVE) ×2
GOWN STRL REUS W/ TWL LRG LVL3 (GOWN DISPOSABLE) IMPLANT
GOWN STRL REUS W/ TWL XL LVL3 (GOWN DISPOSABLE) ×1 IMPLANT
GOWN STRL REUS W/TWL LRG LVL3 (GOWN DISPOSABLE)
GOWN STRL REUS W/TWL XL LVL3 (GOWN DISPOSABLE) ×3
MESH ULTRAPRO 3X6 7.6X15CM (Mesh General) ×2 IMPLANT
NEEDLE HYPO 22GX1.5 SAFETY (NEEDLE) ×3 IMPLANT
NS IRRIG 1000ML POUR BTL (IV SOLUTION) ×2 IMPLANT
PACK BASIN DAY SURGERY FS (CUSTOM PROCEDURE TRAY) ×3 IMPLANT
PENCIL BUTTON HOLSTER BLD 10FT (ELECTRODE) ×3 IMPLANT
SLEEVE SCD COMPRESS KNEE MED (MISCELLANEOUS) ×2 IMPLANT
SPONGE LAP 4X18 X RAY DECT (DISPOSABLE) ×3 IMPLANT
STRIP CLOSURE SKIN 1/2X4 (GAUZE/BANDAGES/DRESSINGS) ×1 IMPLANT
SUT MNCRL AB 4-0 PS2 18 (SUTURE) ×3 IMPLANT
SUT PROLENE 2 0 CT2 30 (SUTURE) ×6 IMPLANT
SUT VIC AB 2-0 SH 27 (SUTURE) ×3
SUT VIC AB 2-0 SH 27XBRD (SUTURE) ×1 IMPLANT
SUT VICRYL 3-0 CR8 SH (SUTURE) ×3 IMPLANT
SYR CONTROL 10ML LL (SYRINGE) ×3 IMPLANT
TOWEL OR 17X24 6PK STRL BLUE (TOWEL DISPOSABLE) ×3 IMPLANT
TOWEL OR NON WOVEN STRL DISP B (DISPOSABLE) ×3 IMPLANT

## 2017-05-31 NOTE — Anesthesia Procedure Notes (Signed)
Anesthesia Regional Block: TAP block   Pre-Anesthetic Checklist: ,, timeout performed, Correct Patient, Correct Site, Correct Laterality, Correct Procedure, Correct Position, site marked, Risks and benefits discussed,  Surgical consent,  Pre-op evaluation,  At surgeon's request and post-op pain management  Laterality: Left  Prep: chloraprep       Needles:   Needle Type: Stimiplex     Needle Length: 9cm      Additional Needles:   Procedures:,,,, ultrasound used (permanent image in chart),,,,  Narrative:  Start time: 05/31/2017 1:25 PM End time: 05/31/2017 1:29 PM Injection made incrementally with aspirations every 5 mL.  Performed by: Personally  Anesthesiologist: Nolon Nations, MD  Additional Notes: Patient tolerated well. Good fascial spread noted.

## 2017-05-31 NOTE — Transfer of Care (Signed)
Immediate Anesthesia Transfer of Care Note  Patient: Ernest Hudson  Procedure(s) Performed: LEFT INGUINAL HERNIA REPAIR WITH MESH (Left Inguinal) INSERTION OF MESH (Left Inguinal)  Patient Location: PACU  Anesthesia Type:GA combined with regional for post-op pain  Level of Consciousness: sedated  Airway & Oxygen Therapy: Patient Spontanous Breathing and Patient connected to face mask oxygen  Post-op Assessment: Report given to RN and Post -op Vital signs reviewed and stable  Post vital signs: Reviewed and stable  Last Vitals:  Vitals Value Taken Time  BP 149/85 05/31/2017  3:47 PM  Temp    Pulse 85 05/31/2017  3:48 PM  Resp 15 05/31/2017  3:48 PM  SpO2 100 % 05/31/2017  3:48 PM  Vitals shown include unvalidated device data.  Last Pain:  Vitals:   05/31/17 1345  TempSrc:   PainSc: 0-No pain      Patients Stated Pain Goal: 3 (00/34/91 7915)  Complications: No apparent anesthesia complications

## 2017-05-31 NOTE — Discharge Instructions (Signed)
[facility]  UMBILICAL OR INGUINAL HERNIA REPAIR: POST OP INSTRUCTIONS  Always review your discharge instruction sheet given to you by the facility where your surgery was performed. IF YOU HAVE DISABILITY OR FAMILY LEAVE FORMS, YOU MUST BRING THEM TO THE OFFICE FOR PROCESSING.   DO NOT GIVE THEM TO YOUR DOCTOR.  1. you may take acetaminophen (Tylenol) and ibuprofen (Advil) for pain. You can take these medications regularly. Follow package directions. By taking both tylenol and ibuprofen regularly this will help control your pain and minimize how much prescription pain medication you may need to take. IF you still have pain after taking both tylenol and ibuprofen then you may take the prescription pain medication - oxycodone. Follow prescription instructions.  Using ice packs will also help control your pain. **Last dose of Tylenol taken at 1:00pm 2. Take your usually prescribed medications unless otherwise directed. 3. If you need a refill on your pain medication, please contact your pharmacy.  They will contact our office to request authorization. Prescriptions will not be filled after 5 pm or on week-ends. 4. You should follow a light diet the first 24 hours after arrival home, such as soup and crackers, etc.  Be sure to include lots of fluids daily.  Resume your normal diet the day after surgery. 5. Most patients will experience some swelling and bruising around the umbilicus or in the groin and scrotum.  Ice packs and reclining will help.  Swelling and bruising can take several days to resolve.  6. It is common to experience some constipation if taking pain medication after surgery.  Increasing fluid intake and taking a stool softener (such as Colace) will usually help or prevent this problem from occurring.  A mild laxative (Milk of Magnesia or Miralax) should be taken according to package directions if there are no bowel movements after 48 hours. 7. Unless discharge  instructions indicate otherwise, you may remove your bandages 48 hours after surgery, and you may shower at that time.  You may have steri-strips (small skin tapes) in place directly over the incision.  These strips should be left on the skin for 7-10 days.   8. ACTIVITIES:  You may resume regular (light) daily activities beginning the next day--such as daily self-care, walking, climbing stairs--gradually increasing activities as tolerated.  You may have sexual intercourse when it is comfortable.  Refrain from any heavy lifting or straining until approved by your doctor. a. You may drive when you are no longer taking prescription pain medication, you can comfortably wear a seatbelt, and you can safely maneuver your car and apply brakes. b. RETURN TO WORK:  9. You should see your doctor in the office for a follow-up appointment approximately 2-3 weeks after your surgery.  Make sure that you call for this appointment within a day or two after you arrive home to insure a convenient appointment time. 10. OTHER INSTRUCTIONS: DO NOT LIFT/PUSH/PULL ANYTHING GREATER THAN 10 LBS FOR 4 WEEKS    WHEN TO CALL YOUR DOCTOR: 1. Fever over 101.0 2. Inability to urinate 3. Nausea and/or vomiting 4. Extreme swelling or bruising 5. Continued bleeding from incision. 6. Increased pain, redness, or drainage from the incision  The clinic staff is available to answer your questions during regular business hours.  Please dont hesitate to call and ask to speak to one of the nurses for clinical concerns.  If you have a medical emergency, go to the nearest emergency room or call 911.  A  surgeon from [facility] is always on call at the hospital   [address] [place], [place], [place]  [address] ?  [address], [place], [place]   [address] [phone] ? [phone] ? FAX [phone] Web site: [email or web address]   Post Anesthesia Home Care Instructions  Activity: Get plenty of  rest for the remainder of the day. A responsible individual must stay with you for 24 hours following the procedure.  For the next 24 hours, DO NOT: -Drive a car -Paediatric nurse -Drink alcoholic beverages -Take any medication unless instructed by your physician -Make any legal decisions or sign important papers.  Meals: Start with liquid foods such as gelatin or soup. Progress to regular foods as tolerated. Avoid greasy, spicy, heavy foods. If nausea and/or vomiting occur, drink only clear liquids until the nausea and/or vomiting subsides. Call your physician if vomiting continues.  Special Instructions/Symptoms: Your throat may feel dry or sore from the anesthesia or the breathing tube placed in your throat during surgery. If this causes discomfort, gargle with warm salt water. The discomfort should disappear within 24 hours.  If you had a scopolamine patch placed behind your ear for the management of post- operative nausea and/or vomiting:  1. The medication in the patch is effective for 72 hours, after which it should be removed.  Wrap patch in a tissue and discard in the trash. Wash hands thoroughly with soap and water. 2. You may remove the patch earlier than 72 hours if you experience unpleasant side effects which may include dry mouth, dizziness or visual disturbances. 3. Avoid touching the patch. Wash your hands with soap and water after contact with the patch.  Post Anesthesia Home Care Instructions  Activity: Get plenty of rest for the remainder of the day. A responsible individual must stay with you for 24 hours following the procedure.  For the next 24 hours, DO NOT: -Drive a car -Paediatric nurse -Drink alcoholic beverages -Take any medication unless instructed by your physician -Make any legal decisions or sign important papers.  Meals: Start with liquid foods such as gelatin or soup. Progress to regular foods as tolerated. Avoid greasy, spicy, heavy foods. If nausea  and/or vomiting occur, drink only clear liquids until the nausea and/or vomiting subsides. Call your physician if vomiting continues.  Special Instructions/Symptoms: Your throat may feel dry or sore from the anesthesia or the breathing tube placed in your throat during surgery. If this causes discomfort, gargle with warm salt water. The discomfort should disappear within 24 hours.  If you had a scopolamine patch placed behind your ear for the management of post- operative nausea and/or vomiting:  1. The medication in the patch is effective for 72 hours, after which it should be removed.  Wrap patch in a tissue and discard in the trash. Wash hands thoroughly with soap and water. 2. You may remove the patch earlier than 72 hours if you experience unpleasant side effects which may include dry mouth, dizziness or visual disturbances. 3. Avoid touching the patch. Wash your hands with soap and water after contact with the patch.

## 2017-05-31 NOTE — Op Note (Signed)
Ernest Hudson [id number] 03-31-1955 05/31/2017  Open Left Indirect Inguinal Hernia Repair with Mesh Procedure Note  Indications: The patient presented with a history of a left, reducible hernia.    Pre-operative Diagnosis: left reducible inguinal hernia  Post-operative Diagnosis: left indirect inguinal hernia  Surgeon: Greer Pickerel   Assistants:   Anesthesia: General endotracheal anesthesia   Surgeon: [name], MD, FACS  Procedure Details  The patient was seen again in the Holding Room. The risks, benefits, complications, treatment options, and expected outcomes were discussed with the patient. The possibilities of reaction to medication, pulmonary aspiration, perforation of viscus, bleeding, recurrent infection, the need for additional procedures, and development of a complication requiring transfusion or further operation were discussed with the patient and/or family. The likelihood of success in repairing the hernia and returning the patient to their previous functional status is good.  There was concurrence with the proposed plan, and informed consent was obtained. The site of surgery was properly noted/marked. The patient was taken to the Operating Room, identified as Ernest Hudson, and the procedure verified as left inguinal hernia repair. A Time Out was held and the above information confirmed. He received ERAS medications and a TAP block preoperatively.   The patient was placed in the supine position and underwent induction of anesthesia. The lower abdomen and groin was prepped with Chloraprep and draped in the standard fashion, and 0.25% Marcaine with epinephrine was used to anesthetize the skin over the mid-portion of the inguinal canal. Ernest oblique incision was made. Dissection was carried down through the subcutaneous tissue with cautery to the external oblique fascia.  We opened the external oblique fascia along the direction of its fibers to the external ring.  The  spermatic cord was circumferentially dissected bluntly and retracted with a Penrose drain.  The floor of the inguinal canal was inspected and there was no direct defect.  We skeletonized the spermatic cord and isolated a indirect sac. I removed and discarded s a small cord lipoma. The indirect sac was reduced.  We used a 3 x 6 inch piece of Ultrapro mesh, which was cut into a keyhole shape.  This was secured with 2-0 Prolene, beginning at the pubic tubercle, running this along the shelving edge inferiorly. Superiorly, the mesh was secured to the internal oblique fascia with interrupted 2-0 Prolene sutures.  The tails of the mesh were sutured together behind the spermatic cord.  The mesh was tucked underneath the external oblique fascia laterally. Some additional local was infiltrated. The external oblique fascia was reapproximated with 2-0 Vicryl.  3-0 Vicryl was used to close the subcutaneous tissues and 4-0 Monocryl was used to close the skin in subcuticular fashion.  Benzoin and steri-strips were used to seal the incision.  A clean dressing was applied.  The patient was then extubated and brought to the recovery room in stable condition.  All sponge, instrument, and needle counts were correct prior to closure and at the conclusion of the case.   Estimated Blood Loss: Minimal                 Complications: None; patient tolerated the procedure well.         Disposition: PACU - hemodynamically stable.         Condition: stable  [name], MD, FACS General, Bariatric, & Minimally Invasive Surgery [facility], [place]

## 2017-05-31 NOTE — Anesthesia Procedure Notes (Signed)
Procedure Name: Intubation Date/Time: 05/31/2017 2:18 PM Performed by: Maryella Shivers, CRNA Pre-anesthesia Checklist: Patient identified, Emergency Drugs available, Suction available and Patient being monitored Patient Re-evaluated:Patient Re-evaluated prior to induction Oxygen Delivery Method: Circle system utilized Preoxygenation: Pre-oxygenation with 100% oxygen Induction Type: IV induction Ventilation: Mask ventilation without difficulty Laryngoscope Size: Mac and 4 Grade View: Grade II Tube type: Oral Tube size: 8.0 mm Number of attempts: 1 Airway Equipment and Method: Stylet and Oral airway Placement Confirmation: ETT inserted through vocal cords under direct vision,  positive ETCO2 and breath sounds checked- equal and bilateral Secured at: 21 cm Tube secured with: Tape Dental Injury: Teeth and Oropharynx as per pre-operative assessment

## 2017-05-31 NOTE — H&P (Signed)
Ernest Hudson Documented: 05/10/2017 2:52 PM Location: [facility] Patient #: [id number] DOB: 1955/07/26 Married / Language: Ernest Hudson / Race: White Male   History of Present Illness Ernest Hiss M. [name] MD; 05/10/2017 3:22 PM) The patient is a 62 year old male who presents with an inguinal hernia. He is referred by his primary care doctor for evaluation of a symptomatic left inguinal hernia. I had previously operated on him in 2016 for open incisional hernia repair with posterior component separation. Is complicated by postoperative hematoma as well as significant ileus. He states that he has been having some issues with swelling in his left groin for the past few weeks. He initially had some minor discomfort about a year ago but he's had one episode of what he describes as bad burning pain in his left groin. He also has had several episodes where it took 30 minutes to up to 2 half hours for the hernia to go back in. He denies any nausea, vomiting, diarrhea or constipation. He does have some nocturia. He has had a prior vasectomy. He denies any chest pain, chest pressure, shortness of breath, dyspnea on exertion. He is quite active [place] his [place].   Problem List/Past Medical Ernest Hiss M. [name], MD; 05/10/2017 3:22 PM) BPH ASSOCIATED WITH NOCTURIA (N40.1)  LEFT INGUINAL HERNIA (K40.90)  ESSENTIAL HYPERTENSION (I10)   Past Surgical History Ernest Hiss M. [name], MD; 05/10/2017 3:22 PM) Vasectomy  Appendectomy  Gallbladder Surgery - Laparoscopic  Lung Surgery  Left.  Diagnostic Studies History Ernest Hiss M. [name], MD; 05/10/2017 3:22 PM) Colonoscopy  5-10 years ago  Allergies Ernest Hudson, [place]; 05/10/2017 2:52 PM) Lipitor *ANTIHYPERLIPIDEMICS*  Niacin (Antihyperlipidemic) *ANTIHYPERLIPIDEMICS*  Allergies Reconciled   Medication History Ernest Hiss M. [name], MD; 05/10/2017 3:22 PM) Multiple Vitamin (Oral) Active. Cod Liver Oil (Oral) Active. AmLODIPine Besylate (10MG  Tablet,  Oral) Active. Aspirin EC (81MG  Tablet DR, Oral) Active. Flonase Allergy Relief (50MCG/ACT Suspension, Nasal as needed) Active. Medications Reconciled Tamsulosin HCl (0.4MG  Capsule, 1 (one) Oral daily, Taken starting 05/10/2017) Active.  Social History Ernest Hiss M. [name], MD; 05/10/2017 3:22 PM) Caffeine use  Carbonated beverages, Coffee, Tea. Tobacco use  Former smoker. No alcohol use  No drug use   Family History Ernest Hiss M. [name], MD; 05/10/2017 3:22 PM) Diabetes Mellitus  Father, Mother. Hypertension  Father. Heart Disease  Father. Breast Cancer  Mother. Prostate Cancer  Father.  Other Problems Ernest Hiss M. [name], MD; 05/10/2017 3:22 PM) Back Pain  Cholelithiasis  Hemorrhoids  Kidney Stone     Review of Systems Ernest Hiss M. [name] MD; 05/10/2017 3:22 PM) All other systems negative  Vitals (Ernest Hudson CMA; 05/10/2017 2:53 PM) 05/10/2017 2:53 PM Weight: 199 lb Height: 70in Body Surface Area: 2.08 m Body Mass Index: 28.55 kg/m  Temp.: 98.88F(Oral)  Pulse: 65 (Regular)  BP: 162/98 (Sitting, Right Arm, Standard)       Physical Exam Ernest Hiss M. [name] MD; 05/10/2017 3:21 PM) General Mental Status-Alert. General Appearance-Consistent with stated age. Hydration-Well hydrated. Voice-Normal.  Head and Neck Head-normocephalic, atraumatic with no lesions or palpable masses. Trachea-midline. Thyroid Gland Characteristics - normal size and consistency. Note: scattered noninflammed/noninfecting appears bumps of various sizes on forehead, b/l face/neck.   Eye Eyeball - Bilateral-Normal. Sclera/Conjunctiva - Bilateral-No scleral icterus.  ENMT Ears -Note: normal ext ears lips intact.   Chest and Lung Exam Chest and lung exam reveals -quiet, even and easy respiratory effort with no use of accessory muscles and on auscultation, normal breath sounds, no adventitious sounds and normal vocal resonance. Inspection Chest Wall -  Normal. Back - normal.  Breast - Did not examine.  Cardiovascular Cardiovascular examination reveals -normal heart sounds, regular rate and rhythm with no murmurs and normal pedal pulses bilaterally.  Abdomen Inspection Inspection of the abdomen reveals - No Hernias. Skin - Scar - Note: well healed midline incision; no bulge/fascial defect. Palpation/Percussion Palpation and Percussion of the abdomen reveal - Soft, Non Tender, No Rebound tenderness, No Rigidity (guarding) and No hepatosplenomegaly. Auscultation Auscultation of the abdomen reveals - Bowel sounds normal.  Male Genitourinary Note: Obvious left inguinal hernia bulge. Reducible. No palpable bulge with Valsalva on the right   Peripheral Vascular Upper Extremity Palpation - Pulses bilaterally normal.  Neurologic Neurologic evaluation reveals -alert and oriented x 3 with no impairment of recent or remote memory. Mental Status-Normal.  Neuropsychiatric The patient's mood and affect are described as -normal. Judgment and Insight-insight is appropriate concerning matters relevant to self.  Musculoskeletal Normal Exam - Left-Upper Extremity Strength Normal and Lower Extremity Strength Normal. Normal Exam - Right-Upper Extremity Strength Normal and Lower Extremity Strength Normal.  Lymphatic Head & Neck  General Head & Neck Lymphatics: Bilateral - Description - Normal. Axillary - Did not examine. Femoral & Inguinal - Did not examine.    Assessment & Plan Ernest Hiss M. [name] MD; 05/10/2017 3:17 PM) LEFT INGUINAL HERNIA (K40.90) Impression: We discussed the etiology of inguinal hernias. We discussed the signs & symptoms of incarceration & strangulation. We discussed non-operative and operative management.  The patient has elected to proceed with OPEN REPAIR OF LEFT INGUINAL HERNIA WITH MESH   I described the procedure in detail. The patient was given educational material. We discussed the risks and  benefits including but not limited to bleeding, infection, chronic inguinal pain, nerve entrapment, hernia recurrence, mesh complications, hematoma formation, urinary retention, injury to the testicles, numbness in the groin, blood clots, injury to the surrounding structures, and anesthesia risk. We also discussed the typical post operative recovery course, including no heavy lifting for 4-6 weeks. I explained that the likelihood of improvement of their symptoms is good  I did discuss that he is slightly increased risk for chronic pain because of his preoperative neuropathic pain. We will place him on perioperative Flomax because of his BPH to try to reduce his risk of postoperative urinary retention Current Plans Pt Education - Pamphlet Given - Hernia Surgery: discussed with patient and provided information. Started Tamsulosin HCl 0.4 MG Oral Capsule, 1 (one) Capsule daily, #30, 05/10/2017, No Refill. BPH ASSOCIATED WITH NOCTURIA (N40.1)  [name], MD, FACS General, Bariatric, & Minimally Invasive Surgery [facility], [place]

## 2017-05-31 NOTE — Progress Notes (Signed)
AssistedDr. Germeroth with left, ultrasound guided, transabdominal plane block. Side rails up, monitors on throughout procedure. See vital signs in flow sheet. Tolerated Procedure well.  

## 2017-05-31 NOTE — Anesthesia Preprocedure Evaluation (Signed)
Anesthesia Evaluation  Patient identified by MRN, date of birth, ID band Patient awake    Reviewed: Allergy & Precautions, NPO status , Patient's Chart, lab work & pertinent test results  History of Anesthesia Complications Negative for: history of anesthetic complications  Airway Mallampati: II  TM Distance: >3 FB Neck ROM: Full    Dental no notable dental hx.    Pulmonary former smoker,    Pulmonary exam normal breath sounds clear to auscultation       Cardiovascular hypertension, Pt. on medications + angina Normal cardiovascular exam+ dysrhythmias  Rhythm:Regular Rate:Normal     Neuro/Psych negative neurological ROS  negative psych ROS   GI/Hepatic negative GI ROS, Neg liver ROS,   Endo/Other  obesity  Renal/GU Renal disease  negative genitourinary   Musculoskeletal  (+) Arthritis ,   Abdominal   Peds negative pediatric ROS (+)  Hematology negative hematology ROS (+)   Anesthesia Other Findings   Reproductive/Obstetrics negative OB ROS                             Anesthesia Physical  Anesthesia Plan  ASA: II  Anesthesia Plan: General   Post-op Pain Management: GA combined w/ Regional for post-op pain   Induction: Intravenous  PONV Risk Score and Plan: 2 and Ondansetron and Dexamethasone  Airway Management Planned: Oral ETT  Additional Equipment:   Intra-op Plan:   Post-operative Plan: Extubation in OR  Informed Consent: I have reviewed the patients History and Physical, chart, labs and discussed the procedure including the risks, benefits and alternatives for the proposed anesthesia with the patient or authorized representative who has indicated his/her understanding and acceptance.   Dental advisory given  Plan Discussed with: CRNA  Anesthesia Plan Comments:         Anesthesia Quick Evaluation

## 2017-05-31 NOTE — Anesthesia Postprocedure Evaluation (Signed)
Anesthesia Post Note  Patient: Ernest Hudson  Procedure(s) Performed: LEFT INGUINAL HERNIA REPAIR WITH MESH (Left Inguinal) INSERTION OF MESH (Left Inguinal)     Patient location during evaluation: PACU Anesthesia Type: General and Regional Level of consciousness: awake and alert Pain management: pain level controlled Vital Signs Assessment: post-procedure vital signs reviewed and stable Respiratory status: spontaneous breathing, nonlabored ventilation, respiratory function stable and patient connected to nasal cannula oxygen Cardiovascular status: blood pressure returned to baseline and stable Postop Assessment: no apparent nausea or vomiting Anesthetic complications: no    Last Vitals:  Vitals:   05/31/17 1615 05/31/17 1630  BP: 134/66 133/74  Pulse: 63 62  Resp: 11 10  Temp:    SpO2: 96% 98%    Last Pain:  Vitals:   05/31/17 1615  TempSrc:   PainSc: 5                  Oddie Kuhlmann P Malay Fantroy

## 2017-05-31 NOTE — Interval H&P Note (Signed)
History and Physical Interval Note:  05/31/2017 1:41 PM  Ernest Hudson  has presented today for surgery, with the diagnosis of LEFT INGUINAL HERNIA  The various methods of treatment have been discussed with the patient and family. After consideration of risks, benefits and other options for treatment, the patient has consented to  Procedure(s): LEFT INGUINAL HERNIA REPAIR WITH MESH (Left) INSERTION OF MESH (Left) as a surgical intervention .  The patient's history has been reviewed, patient examined, no change in status, stable for surgery.  I have reviewed the patient's chart and labs.  Questions were answered to the patient's satisfaction.     Ernest Hudson

## 2017-06-01 ENCOUNTER — Encounter (HOSPITAL_BASED_OUTPATIENT_CLINIC_OR_DEPARTMENT_OTHER): Payer: Self-pay | Admitting: General Surgery

## 2017-06-20 ENCOUNTER — Other Ambulatory Visit: Payer: Self-pay | Admitting: General Surgery

## 2017-07-09 ENCOUNTER — Other Ambulatory Visit: Payer: Self-pay | Admitting: Internal Medicine

## 2017-07-10 MED FILL — AMLODIPINE BESYLATE 10 MG T: 10 | 90 days supply | Qty: 90 | Fill #0

## 2017-09-24 ENCOUNTER — Encounter: Payer: 59 | Admitting: Internal Medicine

## 2017-10-16 DIAGNOSIS — L409 Psoriasis, unspecified: Secondary | ICD-10-CM | POA: Diagnosis not present

## 2017-10-16 DIAGNOSIS — D1801 Hemangioma of skin and subcutaneous tissue: Secondary | ICD-10-CM | POA: Diagnosis not present

## 2017-10-16 DIAGNOSIS — L219 Seborrheic dermatitis, unspecified: Secondary | ICD-10-CM | POA: Diagnosis not present

## 2017-10-16 DIAGNOSIS — L820 Inflamed seborrheic keratosis: Secondary | ICD-10-CM | POA: Diagnosis not present

## 2017-10-16 DIAGNOSIS — Z85828 Personal history of other malignant neoplasm of skin: Secondary | ICD-10-CM | POA: Diagnosis not present

## 2017-10-16 DIAGNOSIS — Z86018 Personal history of other benign neoplasm: Secondary | ICD-10-CM | POA: Diagnosis not present

## 2017-10-16 DIAGNOSIS — D225 Melanocytic nevi of trunk: Secondary | ICD-10-CM | POA: Diagnosis not present

## 2017-10-16 DIAGNOSIS — L821 Other seborrheic keratosis: Secondary | ICD-10-CM | POA: Diagnosis not present

## 2017-10-16 DIAGNOSIS — L814 Other melanin hyperpigmentation: Secondary | ICD-10-CM | POA: Diagnosis not present

## 2017-10-16 MED FILL — TRIAMCINOLONE 0.1% CREAM: 0.1 | 14 days supply | Qty: 80 | Fill #0

## 2017-10-17 ENCOUNTER — Encounter: Payer: Self-pay | Admitting: Internal Medicine

## 2017-10-17 ENCOUNTER — Ambulatory Visit (INDEPENDENT_AMBULATORY_CARE_PROVIDER_SITE_OTHER): Payer: 59 | Admitting: Internal Medicine

## 2017-10-17 VITALS — BP 132/88 | HR 66 | Temp 98.5°F | Ht 70.0 in | Wt 190.0 lb

## 2017-10-17 DIAGNOSIS — I10 Essential (primary) hypertension: Secondary | ICD-10-CM | POA: Diagnosis not present

## 2017-10-17 DIAGNOSIS — Z0000 Encounter for general adult medical examination without abnormal findings: Secondary | ICD-10-CM | POA: Diagnosis not present

## 2017-10-17 DIAGNOSIS — Q8789 Other specified congenital malformation syndromes, not elsewhere classified: Secondary | ICD-10-CM | POA: Diagnosis not present

## 2017-10-17 LAB — CBC
HCT: 47 % (ref 39.0–52.0)
Hemoglobin: 16.6 g/dL (ref 13.0–17.0)
MCHC: 35.3 g/dL (ref 30.0–36.0)
MCV: 86 fl (ref 78.0–100.0)
PLATELETS: 196 10*3/uL (ref 150.0–400.0)
RBC: 5.46 Mil/uL (ref 4.22–5.81)
RDW: 13.6 % (ref 11.5–15.5)
WBC: 5.7 10*3/uL (ref 4.0–10.5)

## 2017-10-17 LAB — COMPREHENSIVE METABOLIC PANEL
ALK PHOS: 85 U/L (ref 39–117)
ALT: 15 U/L (ref 0–53)
AST: 16 U/L (ref 0–37)
Albumin: 4.8 g/dL (ref 3.5–5.2)
BILIRUBIN TOTAL: 0.6 mg/dL (ref 0.2–1.2)
BUN: 20 mg/dL (ref 6–23)
CALCIUM: 10 mg/dL (ref 8.4–10.5)
CO2: 32 mEq/L (ref 19–32)
Chloride: 103 mEq/L (ref 96–112)
Creatinine, Ser: 1.09 mg/dL (ref 0.40–1.50)
GFR: 72.57 mL/min (ref >60.00)
Glucose, Bld: 97 mg/dL (ref 70–99)
Potassium: 4.1 mEq/L (ref 3.5–5.1)
Sodium: 142 mEq/L (ref 135–145)
TOTAL PROTEIN: 7.6 g/dL (ref 6.0–8.3)

## 2017-10-17 MED ORDER — AMLODIPINE BESYLATE 10 MG PO TABS: ORAL_TABLET | ORAL | 3 refills | Status: DC

## 2017-10-17 NOTE — Assessment & Plan Note (Signed)
Got into much better shape this year Colon due 2027 Defer PSA till next year at least Yearly flu vaccine

## 2017-10-17 NOTE — Assessment & Plan Note (Signed)
Needs screening for renal lesions

## 2017-10-17 NOTE — Assessment & Plan Note (Signed)
BP Readings from Last 3 Encounters:  10/17/17 132/88  05/31/17 (!) 144/79  04/30/17 130/86   Good control Will check labs

## 2017-10-17 NOTE — Progress Notes (Signed)
 Subjective:    Patient ID: Ernest Hudson, male    DOB: 06/01/1955, 62 y.o.   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 amLODipine (NORVASC) 10 MG tablet TAKE 1 TABLET BY MOUTH ONCE DAILY (NEED OFFICE VISIT) 90 tablet 1  . aspirin EC 81 MG tablet Take 81 mg by mouth daily.    . cetirizine (ZYRTEC) 10 MG tablet Take 10 mg by mouth every morning.     . fluticasone (FLONASE) 50 MCG/ACT nasal spray Place 2 sprays into both nostrils daily as needed. For allergies. 16 g 6  . ibuprofen (ADVIL,MOTRIN) 200 MG tablet Take 3 tablets (600 mg total) by mouth every 6 (six) hours as needed. You can take 2-3 of these every 6 hours as needed. 20 tablet 0  . Multiple Vitamin (MULTIVITAMIN) tablet Take 1 tablet by mouth daily.    . Omega-3 Fatty Acids (OMEGA 3 PO) Take by mouth.    . triamcinolone cream (KENALOG) 0.1 %   0   No current facility-administered medications on file prior to visit.     Allergies  Allergen Reactions  . Lipitor [Atorvastatin]   . Niacin     REACTION: rash    Past Medical History:  Diagnosis Date  . Adrenal mass (HCC)    "benign" (04/08/2013  . Allergic rhinitis   . Arthritis    "probably in my fingers" (04/08/2013)  . Basal cell carcinoma 2000's   'chest"  . Birt-Hogg-Dube syndrome   . Bradycardia   . Dyslipidemia (high LDL; low HDL)   . 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 History of blood transfusion ?1995  . Hx of colonic polyps   . Hyperlipidemia   . Hypertension   . Ileus, postoperative (HCC) 04/17/2013  . Spontaneous pneumothorax 1995    Past Surgical History:  Procedure Laterality  Date  . APPENDECTOMY  04/07/2013   Procedure: OPEN APPENDECTOMY;  Surgeon: Eric M Wilson, MD;  Location: [facility];  Service: General;;  . cardiolyte stress  04/2008  . CHOLECYSTECTOMY  2004  . COLONOSCOPY  06/2005  . ELBOW SURGERY Left 2001   "tendonitis"  . EXTERNAL EAR SURGERY  1971  . INCISIONAL HERNIA REPAIR N/A 05/12/2014   Procedure: OPEN REPAIR OF INCISIONAL HERNIA REPAIR;  Surgeon: Eric Wilson, MD;  Location: [facility];  Service: General;  Laterality: N/A;  . INGUINAL HERNIA REPAIR Left 05/31/2017   Procedure: LEFT INGUINAL HERNIA REPAIR WITH MESH;  Surgeon: Wilson, Eric, MD;  Location: [facility];  Service: General;  Laterality: Left;  . INSERTION OF MESH N/A 05/12/2014   Procedure: WITH INSERTION OF MESH;  Surgeon: Eric Wilson, MD;  Location: [facility];  Service: General;  Laterality: N/A;  . INSERTION OF MESH Left 05/31/2017   Procedure: INSERTION OF MESH;  Surgeon: Wilson, Eric, MD;  Location: [facility];  Service: General;  Laterality: Left;  . KNEE ARTHROSCOPY Left 1990's  . LAPAROSCOPIC APPENDECTOMY N/A 04/07/2013   Procedure: ATTEMPTED APPENDECTOMY LAPAROSCOPIC;  Surgeon: Eric M Wilson, MD;  Location: [facility];  Service: General;  Laterality: N/A;  .   LUNG SURGERY     "repaired ruptured bleb" '95  . MIDDLE EAR SURGERY Left 1972   exploratory tympanotomy/notes 07/17/2000  . NASAL SEPTUM SURGERY    . PARTIAL COLECTOMY  04/07/2013   Procedure: PARTIAL COLECTOMY, ILEOCECTOMY;  Surgeon: Gayland Curry, MD;  Location: [facility];  Service: General;;  . SKIN CANCER EXCISION  2000's   "chest"  . TENDON REPAIR Left 1990's   "thumb"    Family History  Problem Relation Age of Onset  . Healthy Sister   . Lung disease Sister        has blebs on MRI,   . Breast cancer Maternal Aunt        diagnosed in her 19s  . Melanoma Maternal Uncle   . Breast cancer Maternal Aunt        diagnosed in her 9s  . Breast cancer Maternal Aunt        diagnosed in her 10s  . Breast cancer  Mother   . Diabetes Mother   . Heart attack Father   . Diabetes Father   . Prostate cancer Father   . Healthy Brother   . Healthy Brother   . Cancer Cousin        multiple myeloma  . Colon cancer Neg Hx     Social History   Socioeconomic History  . Marital status: Married    Spouse name: Not on file  . Number of children: 4  . Years of education: Not on file  . Highest education level: Not on file  Occupational History  . 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 Financial resource strain: Not on file  . Food insecurity:    Worry: Not on file    Inability: Not on file  . Transportation needs:    Medical: Not on file    Non-medical: Not on file  Tobacco Use  . Smoking status: Former Smoker    Packs/day: 2.00    Years: 30.00    Pack years: 60.00    Types: Cigarettes    Last attempt to quit: 07/30/2012    Years since quitting: 5.2  . Smokeless tobacco: Never Used  Substance and Sexual Activity  . Alcohol use: No    Alcohol/week: 0.0 standard drinks  . Drug use: No  . Sexual activity: Not Currently  Lifestyle  . Physical activity:    Days per week: Not on file    Minutes per session: Not on file  . Stress: Not on file  Relationships  . Social connections:    Talks on phone: Not on file    Gets together: Not on file    Attends religious service: Not on file    Active member of club or organization: Not on file    Attends meetings of clubs or organizations: Not on file    Relationship status: Not on file  . Intimate partner violence:    Fear of current or ex partner: Not on file    Emotionally abused: Not on file    Physically abused: Not on file    Forced sexual activity: Not on file  Other Topics Concern  . Not on file  Social History Narrative   Married--1 daughter and 3 step daughters   Review of Systems  Constitutional:       Has lost 35# in past year Some hiking and stays active Wears seat belt  HENT: Negative for  dental problem, tinnitus and trouble swallowing.  Deaf in left ear  Eyes: Negative for visual disturbance.       No diplopia or unilateral vision loss  Respiratory: Negative for cough, chest tightness and shortness of breath.   Cardiovascular: Negative for chest pain, palpitations and leg swelling.  Gastrointestinal: Negative for blood in stool and constipation.       Rare heartburn--no meds  Endocrine: Negative for polydipsia and polyuria.  Genitourinary: Positive for frequency. Negative for difficulty urinating and urgency.       No sexual problems  Musculoskeletal: Negative for back pain and joint swelling.       Past knee and elbow pain---good now  Skin:       Keeps up with derm Psoriasis--using topical Rx now  Allergic/Immunologic: Positive for environmental allergies. Negative for immunocompromised state.       Uses cetirizine (and flonase at times)  Neurological: Negative for dizziness, syncope, light-headedness and headaches.  Hematological: Negative for adenopathy. Does not bruise/bleed easily.  Psychiatric/Behavioral: Negative for dysphoric mood and sleep disturbance. The patient is not nervous/anxious.        Nocturia x 1       Objective:   Physical Exam  Constitutional: He is oriented to person, place, and time. He appears well-developed. No distress.  HENT:  Head: Normocephalic and atraumatic.  Right Ear: External ear normal.  Left Ear: External ear normal.  Mouth/Throat: Oropharynx is clear and moist. No oropharyngeal exudate.  Eyes: Pupils are equal, round, and reactive to light. Conjunctivae are normal.  Neck: No thyromegaly present.  Cardiovascular: Normal rate, regular rhythm, normal heart sounds and intact distal pulses. Exam reveals no gallop.  No murmur heard. Respiratory: Effort normal. No respiratory distress. He has no wheezes. He has no rales.  Slightly decreased breath sounds  GI: Soft. There is no tenderness.  Musculoskeletal: He exhibits no  edema or tenderness.  Lymphadenopathy:    He has no cervical adenopathy.  Neurological: He is alert and oriented to person, place, and time.  Skin: No erythema.  Mild psoriatic rash  Psychiatric: He has a normal mood and affect. His behavior is normal.           Assessment & Plan:   

## 2017-10-23 ENCOUNTER — Ambulatory Visit
Admission: RE | Admit: 2017-10-23 | Discharge: 2017-10-23 | Disposition: A | Discharge status: Home / Self Care | Payer: 59 | Source: Ambulatory Visit | Attending: Internal Medicine | Admitting: Internal Medicine

## 2017-10-23 DIAGNOSIS — Q8789 Other specified congenital malformation syndromes, not elsewhere classified: Secondary | ICD-10-CM | POA: Insufficient documentation

## 2017-11-05 ENCOUNTER — Other Ambulatory Visit: Payer: Self-pay

## 2017-11-05 ENCOUNTER — Encounter: Payer: Self-pay | Admitting: Emergency Medicine

## 2017-11-05 ENCOUNTER — Emergency Department
Admission: EM | Admit: 2017-11-05 | Discharge: 2017-11-05 | Disposition: A | Discharge status: Home / Self Care | Payer: 59 | Attending: Emergency Medicine | Admitting: Emergency Medicine

## 2017-11-05 ENCOUNTER — Emergency Department: Payer: 59

## 2017-11-05 DIAGNOSIS — Z79899 Other long term (current) drug therapy: Secondary | ICD-10-CM | POA: Diagnosis not present

## 2017-11-05 DIAGNOSIS — R918 Other nonspecific abnormal finding of lung field: Secondary | ICD-10-CM | POA: Diagnosis not present

## 2017-11-05 DIAGNOSIS — Z85828 Personal history of other malignant neoplasm of skin: Secondary | ICD-10-CM | POA: Diagnosis not present

## 2017-11-05 DIAGNOSIS — Z87891 Personal history of nicotine dependence: Secondary | ICD-10-CM | POA: Diagnosis not present

## 2017-11-05 DIAGNOSIS — I10 Essential (primary) hypertension: Secondary | ICD-10-CM | POA: Diagnosis not present

## 2017-11-05 DIAGNOSIS — R42 Dizziness and giddiness: Secondary | ICD-10-CM | POA: Diagnosis not present

## 2017-11-05 DIAGNOSIS — R110 Nausea: Secondary | ICD-10-CM | POA: Diagnosis not present

## 2017-11-05 LAB — COMPREHENSIVE METABOLIC PANEL
ALK PHOS: 91 U/L (ref 38–126)
ALT: 22 U/L (ref 0–44)
AST: 19 U/L (ref 15–41)
Albumin: 4.5 g/dL (ref 3.5–5.0)
Anion gap: 7 (ref 5–15)
BUN: 12 mg/dL (ref 8–23)
CHLORIDE: 106 mmol/L (ref 98–111)
CO2: 29 mmol/L (ref 22–32)
CREATININE: 0.79 mg/dL (ref 0.61–1.24)
Calcium: 9.3 mg/dL (ref 8.9–10.3)
GFR calc Af Amer: >60 mL/min (ref >60)
Glucose, Bld: 110 mg/dL — ABNORMAL HIGH (ref 70–99)
Potassium: 3.7 mmol/L (ref 3.5–5.1)
SODIUM: 142 mmol/L (ref 135–145)
Total Bilirubin: 0.6 mg/dL (ref 0.3–1.2)
Total Protein: 7.6 g/dL (ref 6.5–8.1)

## 2017-11-05 LAB — CBC
HEMATOCRIT: 46.5 % (ref 40.0–52.0)
HEMOGLOBIN: 17.1 g/dL (ref 13.0–18.0)
MCH: 31.4 pg (ref 26.0–34.0)
MCHC: 36.8 g/dL — AB (ref 32.0–36.0)
MCV: 85.5 fL (ref 80.0–100.0)
Platelets: 195 10*3/uL (ref 150–440)
RBC: 5.44 MIL/uL (ref 4.40–5.90)
RDW: 13.9 % (ref 11.5–14.5)
WBC: 6 10*3/uL (ref 3.8–10.6)

## 2017-11-05 LAB — TROPONIN I

## 2017-11-05 MED ORDER — ONDANSETRON 4 MG PO TBDP: 4.0000 mg | ORAL_TABLET | Freq: Three times a day (TID) | ORAL | 0 refills | Status: DC | PRN

## 2017-11-05 MED ORDER — ONDANSETRON HCL 4 MG/2ML IJ SOLN
4.0000 mg | Freq: Once | INTRAMUSCULAR | Status: AC
  Administered 2017-11-05: 4 mg via INTRAVENOUS
  Filled 2017-11-05: qty 2

## 2017-11-05 MED ORDER — SODIUM CHLORIDE 0.9 % IV SOLN
1000.0000 mL | Freq: Once | INTRAVENOUS | Status: AC
  Administered 2017-11-05: 1000 mL via INTRAVENOUS

## 2017-11-05 MED ORDER — MECLIZINE HCL 25 MG PO TABS: 25.0000 mg | ORAL_TABLET | Freq: Three times a day (TID) | ORAL | 0 refills | Status: DC | PRN

## 2017-11-05 NOTE — ED Triage Notes (Signed)
Dizziness began 7p last night and continues. Denies headache or chest pain. Smile symmetrical, grips equal, speech, clear.

## 2017-11-06 NOTE — ED Provider Notes (Signed)
[facility] Emergency Department Provider Note   ____________________________________________    I have reviewed the triage vital signs and the nursing notes.   HISTORY  Chief Complaint Dizziness     HPI Ernest Hudson is a 62 y.o. male who presents with complaints of dizziness.  Patient notes symptoms started yesterday evening but he thought by sleeping they would get better, this morning he woke up and he still felt dizzy.  By dizzy he means a sensation of the room was moving.  Not lightheaded at all.  No palpitations.  No chest pain.  No neuro deficits.  No headache.  Does have a distant history of vertigo in the past.  No tinnitus, does have decreased hearing in the left ear which is chronic   Past Medical History:  Diagnosis Date  . Adrenal mass ([place])    "benign" (04/08/2013  . Allergic rhinitis   . Arthritis    "probably in my fingers" (04/08/2013)  . Basal cell carcinoma 2000's   'chest"  . Birt-Hogg-Dube syndrome   . Bradycardia   . Dyslipidemia (high LDL; low HDL)   . 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 History of blood transfusion ?1995  . Hx of colonic polyps   . Hyperlipidemia   . Hypertension   . Ileus, postoperative ([place]) 04/17/2013  . Spontaneous pneumothorax 1995    Patient Active Problem List   Diagnosis Date Noted  . Preventative health care 10/17/2017  . Pneumothorax on right 05/08/2016  . Incisional hernia s/p open repair w mesh 05/13/2014 05/12/2014  . 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 HYPERLIPIDEMIA 10/29/2007  . RHINITIS MEDICAMENTOSA 08/26/2007  . Essential hypertension 10/02/2006  . ALLERGIC RHINITIS 10/02/2006    Past Surgical History:  Procedure Laterality Date  . APPENDECTOMY  04/07/2013   Procedure: OPEN APPENDECTOMY;  Surgeon: Gayland Curry, MD;  Location: [facility];  Service: General;;  . cardiolyte stress  04/2008  . CHOLECYSTECTOMY  2004  . COLONOSCOPY  06/2005  . ELBOW SURGERY  Left 2001   "tendonitis"  . EXTERNAL EAR SURGERY  1971  . INCISIONAL HERNIA REPAIR N/A 05/12/2014   Procedure: OPEN REPAIR OF INCISIONAL HERNIA REPAIR;  Surgeon: Greer Pickerel, MD;  Location: [facility];  Service: General;  Laterality: N/A;  . INGUINAL HERNIA REPAIR Left 05/31/2017   Procedure: LEFT INGUINAL HERNIA REPAIR WITH MESH;  Surgeon: Greer Pickerel, MD;  Location: [facility];  Service: General;  Laterality: Left;  . INSERTION OF MESH N/A 05/12/2014   Procedure: WITH INSERTION OF MESH;  Surgeon: Greer Pickerel, MD;  Location: [facility];  Service: General;  Laterality: N/A;  . INSERTION OF MESH Left 05/31/2017   Procedure: INSERTION OF MESH;  Surgeon: Greer Pickerel, MD;  Location: [facility];  Service: General;  Laterality: Left;  . KNEE ARTHROSCOPY Left 1990's  . LAPAROSCOPIC APPENDECTOMY N/A 04/07/2013   Procedure: ATTEMPTED APPENDECTOMY LAPAROSCOPIC;  Surgeon: Gayland Curry, MD;  Location: [facility];  Service: General;  Laterality: N/A;  . LUNG SURGERY     "repaired ruptured bleb" '95  . MIDDLE EAR SURGERY Left 1972   exploratory tympanotomy/notes 07/17/2000  . NASAL SEPTUM SURGERY    . PARTIAL COLECTOMY  04/07/2013   Procedure: PARTIAL COLECTOMY, ILEOCECTOMY;  Surgeon: Gayland Curry, MD;  Location: [facility];  Service: General;;  . SKIN CANCER EXCISION  2000's   "chest"  . TENDON REPAIR Left 1990's   "thumb"  Prior to Admission medications   Medication Sig Start Date End Date Taking? Authorizing Provider  amLODipine (NORVASC) 10 MG tablet TAKE 1 TABLET BY MOUTH ONCE DAILY 10/17/17  Yes Viviana Simpler I, MD  aspirin EC 81 MG tablet Take 81 mg by mouth daily.   Yes [provider]  Multiple Vitamin (MULTIVITAMIN) tablet Take 1 tablet by mouth daily.   Yes [provider]  Omega-3 Fatty Acids (OMEGA 3 PO) Take 1 tablet by mouth daily.    Yes [provider]  cetirizine (ZYRTEC) 10 MG tablet Take 10 mg by mouth every morning.     [provider]  fluticasone (FLONASE) 50 MCG/ACT nasal spray Place 2 sprays into both nostrils daily as needed. For allergies. Patient not taking: Reported on 11/05/2017 05/26/16   Marletta Lor, MD  ibuprofen (ADVIL,MOTRIN) 200 MG tablet Take 3 tablets (600 mg total) by mouth every 6 (six) hours as needed. You can take 2-3 of these every 6 hours as needed. Patient not taking: Reported on 11/05/2017 05/12/16   Thurnell Lose, MD  meclizine (ANTIVERT) 25 MG tablet Take 1 tablet (25 mg total) by mouth 3 (three) times daily as needed for dizziness. 11/05/17   Lavonia Drafts, MD  ondansetron (ZOFRAN ODT) 4 MG disintegrating tablet Take 1 tablet (4 mg total) by mouth every 8 (eight) hours as needed for nausea or vomiting. 11/05/17   Lavonia Drafts, MD  triamcinolone cream (KENALOG) 0.1 % Apply 1 application topically 2 (two) times daily.  10/16/17   [provider]     Allergies Lipitor [atorvastatin] and Niacin  Family History  Problem Relation Age of Onset  . Healthy Sister   . Lung disease Sister        has blebs on MRI,   . Breast cancer Maternal Aunt        diagnosed in her 36s  . Melanoma Maternal Uncle   . Breast cancer Maternal Aunt        diagnosed in her 51s  . Breast cancer Maternal Aunt        diagnosed in her 100s  . Breast cancer Mother   . Diabetes Mother   . Cancer Mother        breast  . Heart attack Father   . Diabetes Father   . Prostate cancer Father   . Cancer Father   . Healthy Brother   . Prostate cancer Brother   . Healthy Brother   . Cancer Cousin        multiple myeloma  . Colon cancer Neg Hx     Social History Social History   Tobacco Use  . Smoking status: Former Smoker    Packs/day: 2.00    Years: 30.00    Pack years: 60.00    Types: Cigarettes    Last attempt to quit: 07/30/2012    Years since quitting: 5.2  . Smokeless tobacco: Never Used  Substance Use Topics  . Alcohol use: No    Alcohol/week: 0.0 standard drinks  . Drug  use: No    Review of Systems  Constitutional: No fever/chills Eyes: No visual changes.  ENT: No sore throat. Cardiovascular: Denies chest pain. Respiratory: Denies shortness of breath. Gastrointestinal: Mild nausea Genitourinary: Negative for dysuria. Musculoskeletal: Negative for back pain. Skin: Negative for rash. Neurological: As above   ____________________________________________   PHYSICAL EXAM:  VITAL SIGNS: ED Triage Vitals  Enc Vitals Group     BP 11/05/17 0819 (!) 207/83  Pulse Rate 11/05/17 0819 (!) 48     Resp 11/05/17 0819 18     Temp 11/05/17 0819 97.9 F (36.6 C)     Temp Source 11/05/17 0819 Oral     SpO2 11/05/17 0819 100 %     Weight 11/05/17 0820 86.2 kg (190 lb)     Height 11/05/17 0820 1.778 m (_0 )     Head Circumference --      Peak Flow --      Pain Score 11/05/17 0820 0     Pain Loc --      Pain Edu? --      Excl. in [place]? --     Constitutional: Alert and oriented. No acute distress. Pleasant and interactive Ears: No ab normalities of the TMs Nose: No congestion/rhinnorhea. Mouth/Throat: Mucous membranes are moist.    Cardiovascular: Normal rate, regular rhythm. Grossly normal heart sounds.  Good peripheral circulation. Respiratory: Normal respiratory effort.  No retractions. Lungs CTAB. Gastrointestinal: Soft and nontender. No distention.  No CVA tenderness. Genitourinary: deferred Musculoskeletal: No lower extremity tenderness nor edema.  Warm and well perfused Neurologic:  Normal speech and language. No gross focal neurologic deficits are appreciated.  Cranial nerves II through XII are normal, no dysdiadochokinesis Skin:  Skin is warm, dry and intact. No rash noted. Psychiatric: Mood and affect are normal. Speech and behavior are normal.  ____________________________________________   LABS (all labs ordered are listed, but only abnormal results are displayed)  Labs Reviewed  CBC - Abnormal; Notable for the following  components:      Result Value   MCHC 36.8 (*)    All other components within normal limits  COMPREHENSIVE METABOLIC PANEL - Abnormal; Notable for the following components:   Glucose, Bld 110 (*)    All other components within normal limits  TROPONIN I   ____________________________________________  EKG  ED ECG REPORT I, Lavonia Drafts, the attending physician, personally viewed and interpreted this ECG.  Date: 11/06/2017  Rhythm: normal sinus rhythm QRS Axis: normal Intervals: normal ST/T Wave abnormalities: normal Narrative Interpretation: no evidence of acute ischemia  ____________________________________________  RADIOLOGY  No acute ab normality on chest x-ray ____________________________________________   PROCEDURES  Procedure(s) performed: No  Procedures   Critical Care performed: No ____________________________________________   INITIAL IMPRESSION / ASSESSMENT AND PLAN / ED COURSE  Pertinent labs & imaging results that were available during my care of the patient were reviewed by me and considered in my medical decision making (see chart for details).  Patient presents with what appears to be vertigo, no concerning neurological deficits or headaches.  Exam is overall reassuring.  Lab work is unremarkable, chest x-ray does not demonstrate any infection.  EKG is reassuring.  Will treat with IV fluids, IV Zofran and reevaluate   Patient had resolution of vertiginous symptoms after treatment.  He feels much better.  I will discharge him with as needed meclizine, Zofran and follow-up with ENT as needed    ____________________________________________   FINAL CLINICAL IMPRESSION(S) / ED DIAGNOSES  Final diagnoses:  Vertigo        Note:  This document was prepared using Dragon voice recognition software and may include unintentional dictation errors.    Lavonia Drafts, MD 11/06/17 1352

## 2017-12-10 ENCOUNTER — Ambulatory Visit: Payer: Self-pay | Admitting: Physician Assistant

## 2017-12-10 VITALS — BP 180/110 | HR 65 | Temp 98.1°F | Wt 195.0 lb

## 2017-12-10 DIAGNOSIS — I10 Essential (primary) hypertension: Secondary | ICD-10-CM

## 2017-12-10 DIAGNOSIS — J0100 Acute maxillary sinusitis, unspecified: Secondary | ICD-10-CM

## 2017-12-10 MED ORDER — AMOXICILLIN-POT CLAVULANATE 875-125 MG PO TABS: 1.0000 | ORAL_TABLET | Freq: Two times a day (BID) | ORAL | 0 refills | Status: AC

## 2017-12-10 MED ORDER — FLUTICASONE PROPIONATE 50 MCG/ACT NA SUSP: 2.0000 | Freq: Every day | NASAL | 0 refills | Status: DC

## 2017-12-10 NOTE — Patient Instructions (Addendum)
Thank you for choosing [facility] for your health care needs.  You have been diagnosed with sinusitis.   Increase fluids. Rest. May continue to use over the counter Zyrtec. Recommend no use of decongestants; such as Sudafed or Allegra-D, due to decongestant raising blood pressure.  Take antibiotic, Augmentin, as prescribed. Use steroid nasal spray, Flonase, as prescribed.  Follow-up in a few days if symptoms not improving.  Your blood pressure was elevated in the office today. Recommend you follow-up with your family physician for further evaluation and treatment of your blood pressure.  Sinusitis, Adult  Sinusitis is soreness and inflammation of your sinuses. Sinuses are hollow spaces in the bones around your face. They are located:  Around your eyes.  In the middle of your forehead.  Behind your nose.  In your cheekbones.  Your sinuses and nasal passages are lined with a stringy fluid (mucus). Mucus normally drains out of your sinuses. When your nasal tissues get inflamed or swollen, the mucus can get trapped or blocked so air cannot flow through your sinuses. This lets bacteria, viruses, and funguses grow, and that leads to infection. Follow these instructions at home: Medicines  Take, use, or apply over-the-counter and prescription medicines only as told by your doctor. These may include nasal sprays.  If you were prescribed an antibiotic medicine, take it as told by your doctor. Do not stop taking the antibiotic even if you start to feel better. Hydrate and Humidify  Drink enough water to keep your pee (urine) clear or pale yellow.  Use a cool mist humidifier to keep the humidity level in your home above 50%.  Breathe in steam for 10-15 minutes, 3-4 times a day or as told by your doctor. You can do this in the bathroom while a hot shower is running.  Try not to spend time in cool or dry air. Rest  Rest as much as possible.  Sleep with your head raised  (elevated).  Make sure to get enough sleep each night. General instructions  Put a warm, moist washcloth on your face 3-4 times a day or as told by your doctor. This will help with discomfort.  Wash your hands often with soap and water. If there is no soap and water, use hand sanitizer.  Do not smoke. Avoid being around people who are smoking (secondhand smoke).  Keep all follow-up visits as told by your doctor. This is important. Contact a doctor if:  You have a fever.  Your symptoms get worse.  Your symptoms do not get better within 10 days. Get help right away if:  You have a very bad headache.  You cannot stop throwing up (vomiting).  You have pain or swelling around your face or eyes.  You have trouble seeing.  You feel confused.  Your neck is stiff.  You have trouble breathing. This information is not intended to replace advice given to you by your health care provider. Make sure you discuss any questions you have with your health care provider. Document Released: 07/26/2007 Document Revised: 10/03/2015 Document Reviewed: 12/02/2014 Elsevier Interactive Patient Education  [employer].

## 2017-12-10 NOTE — Progress Notes (Signed)
Patient ID: Ernest Hudson DOB: 10-26-1955 AGE: 62 y.o. MRN: [id number]   PCP: Venia Carbon, MD   Chief Complaint:  Chief Complaint  Patient presents with  . save-ha/greenish mucus     Subjective:    HPI:  Ernest Hudson is a 62 y.o. male presents for evaluation  Chief Complaint  Patient presents with  . save-ha/greenish mucus    62 year old male presents to [facility] with 1-2 week history of sinus symptoms. Reports rhinorrhea, nasal congestion, sinus pressure/tenderness, and postnasal drip. Symptoms worsening. Has developed bilateral ear pain. This morning states he woke up with full body soreness; feels like he worked out, but he did not. Associated chills. Has been using saline nasal spray with moderate symptom relief. Takes Zyrtec daily for allergy control. Denies fever, headache, dizziness/lightheadedness, ear discharge/drainage, sore throat, cough (other than mild cough from PND), chest pressure/tightness/congestion, wheezing.  Patient's wife with similar symptoms. Patient's wife has appointment with PCP today.  Last oral antibiotic, Augmentin, on 02/26/2017, for sinusitis. Seen at [facility] by Dutch Quint FNP. Received Cefazolin IV for hernia repair 05/31/2017.  Patient seen one month ago, 11/05/2017, at the ED for vertigo. Prescribed Meclizine and Zofran. Symptoms resolved within one day. Have not returned.  Patient is a former cigarette smoker. Quit 7 years ago.  A complete, at least 10 system review of symptoms was performed, pertinent positives and negatives as mentioned in HPI, otherwise negative.  The following portions of the patient's history were reviewed and updated as appropriate: allergies, current medications and past medical history.  Patient Active Problem List   Diagnosis Date Noted  . Preventative health care 10/17/2017  . Pneumothorax on right 05/08/2016  . Incisional hernia s/p open repair w mesh 05/13/2014 05/12/2014  .  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 HYPERLIPIDEMIA 10/29/2007  . RHINITIS MEDICAMENTOSA 08/26/2007  . Essential hypertension 10/02/2006  . ALLERGIC RHINITIS 10/02/2006    Allergies  Allergen Reactions  . Lipitor [Atorvastatin]   . Niacin     REACTION: rash    Current Outpatient Medications on File Prior to Visit  Medication Sig Dispense Refill  . amLODipine (NORVASC) 10 MG tablet TAKE 1 TABLET BY MOUTH ONCE DAILY 90 tablet 3  . cetirizine (ZYRTEC) 10 MG tablet Take 10 mg by mouth every morning.     . fluticasone (FLONASE) 50 MCG/ACT nasal spray Place 2 sprays into both nostrils daily as needed. For allergies. 16 g 6  . ibuprofen (ADVIL,MOTRIN) 200 MG tablet Take 3 tablets (600 mg total) by mouth every 6 (six) hours as needed. You can take 2-3 of these every 6 hours as needed. 20 tablet 0  . Multiple Vitamin (MULTIVITAMIN) tablet Take 1 tablet by mouth daily.    . Omega-3 Fatty Acids (OMEGA 3 PO) Take 1 tablet by mouth daily.     Marland Kitchen triamcinolone cream (KENALOG) 0.1 % Apply 1 application topically 2 (two) times daily.   0   No current facility-administered medications on file prior to visit.        Objective:    Vitals:   12/10/17 1008  BP: (!) 180/110  Pulse: 65  Temp: 98.1 F (36.7 C)  SpO2: 96%     Wt Readings from Last 3 Encounters:  12/10/17 195 lb (88.5 kg)  11/05/17 190 lb (86.2 kg)  10/17/17 190 lb (86.2 kg)    Physical Exam:   General Appearance:  Alert, cooperative, no distress, appears stated age. Afebrile.  Head:  Normocephalic, without obvious  abnormality, atraumatic  Eyes:  PERRL, conjunctiva/corneas clear, EOM's intact, fundi benign, both eyes  Ears:  Normal TM's and external ear canals, both ears  Nose: Nares normal, septum midline. Bilateral edema of nasal mucosa. Scant clear rhinorrhea. Nasally sounding voice. Tenderness with palpation over bilateral maxillary sinuses.  Throat: Lips, mucosa, and tongue normal; teeth and gums normal. Scant  erythema. Mild postnasal drip.  Neck: Supple, symmetrical, trachea midline, no adenopathy;  thyroid: not enlarged, symmetric, no tenderness/mass/nodules; no carotid bruit or JVD  Back:   Symmetric, no curvature, ROM normal, no CVA tenderness  Lungs:   Clear to auscultation bilaterally, respirations unlabored. No wheezing, rales, rhonchi, or crackles. No cough elicited with deep inspiration or forced expiration.  Heart:  Regular rate and rhythm, S1 and S2 normal  Abdomen:   Soft, non-tender, bowel sounds active all four quadrants,  no masses, no organomegaly  Extremities: Extremities normal, atraumatic, no cyanosis or edema  Pulses: 2+ and symmetric  Skin: Skin color, texture, turgor normal, no rashes or lesions  Lymph nodes: Cervical, supraclavicular, and axillary nodes normal  Neurologic: Normal    Assessment & Plan:   1. Acute non-recurrent maxillary sinusitis  - amoxicillin-clavulanate (AUGMENTIN) 875-125 MG tablet; Take 1 tablet by mouth 2 (two) times daily for 10 days.  Dispense: 20 tablet; Refill: 0 - fluticasone (FLONASE) 50 MCG/ACT nasal spray; Place 2 sprays into both nostrils daily for 10 days.  Dispense: 16 g; Refill: 0  2. Uncontrolled hypertension  Patient with one week of sinus symptoms. Diagnosed with sinusitis. Prescribed Augmentin and Flonase nasal spray. Patient with elevated blood pressure reading in office today. Diagnosis of HTN. Currently on rx medication. Advised f/u with PCP.     Exam findings, diagnosis etiology and medication use and indications reviewed with patient. Follow-Up and discharge instructions provided. No emergent/urgent issues found on exam.  Patient education was provided.   Patient verbalized understanding of information provided and agrees with plan of care (POC), all questions answered. The patient is advised to call or return to clinic if condition does not see an improvement in symptoms, or to seek the care of the closest emergency  department if condition worsens with the above plan.    Montey Hora, MHS, PA-C Advanced Practice Provider [facility]  [address], [facility], [place], [place] [address] (p):  [phone] [email or web address]

## 2017-12-12 ENCOUNTER — Encounter: Payer: Self-pay | Admitting: Family Medicine

## 2017-12-12 ENCOUNTER — Telehealth: Payer: Self-pay | Admitting: Emergency Medicine

## 2017-12-12 ENCOUNTER — Ambulatory Visit: Payer: Self-pay | Admitting: *Deleted

## 2017-12-12 ENCOUNTER — Ambulatory Visit (INDEPENDENT_AMBULATORY_CARE_PROVIDER_SITE_OTHER): Payer: 59 | Admitting: Family Medicine

## 2017-12-12 VITALS — BP 170/86 | HR 63 | Temp 98.1°F | Ht 70.0 in | Wt 196.1 lb

## 2017-12-12 DIAGNOSIS — I10 Essential (primary) hypertension: Secondary | ICD-10-CM

## 2017-12-12 MED ORDER — HYDROCHLOROTHIAZIDE 25 MG PO TABS: 25.0000 mg | ORAL_TABLET | Freq: Every day | ORAL | 3 refills | Status: DC

## 2017-12-12 NOTE — Patient Instructions (Signed)
Good to see you today  I have sent in a new blood pressure medication, start today  Please follow up in a week for blood pressure check and blood work  Check your blood pressure every day or two and keep a log  If you have any severe headache, chest pain or shortness of breath, please let us know, if nights or weekend go to the ER

## 2017-12-12 NOTE — Progress Notes (Signed)
Subjective:    Patient ID: Ernest Hudson, male    DOB: [birth year], 62 y.o.   MRN: [id number]  HPI This is a 62 yo male who presents today with elevated bp. Was seen at [facility] care 12/10/17 for sinus infection and was found to have elevated blood pressure. No cold medicine. Currently on amlodipine 10 mg daily. Was on lisinopril in addition but came off. BP at home 180/99, 197/93 today. Has been running consistently in 150 prior to recent elevation. He denies headache, visual changes, chest pain or SOB, lower extremity edema.    Past Medical History:  Diagnosis Date  . 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 Allergic rhinitis   . Arthritis    "probably in my fingers" (04/08/2013)  . Basal cell carcinoma 2000's   'chest"  . Birt-Hogg-Dube syndrome   . Bradycardia   . Dyslipidemia (high LDL; low HDL)   . 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 History of blood transfusion ?1995  . Hx of colonic polyps   . Hyperlipidemia   . Hypertension   . Ileus, postoperative ([place]) 04/17/2013  . Spontaneous pneumothorax 1995   Past Surgical History:  Procedure Laterality Date  . APPENDECTOMY  04/07/2013   Procedure: OPEN APPENDECTOMY;  Surgeon: Gayland Curry, MD;  Location: [place];  Service: General;;  . cardiolyte stress  04/2008  . CHOLECYSTECTOMY  2004  . COLONOSCOPY  06/2005  . ELBOW SURGERY Left 2001   "tendonitis"  . EXTERNAL EAR SURGERY  1971  . INCISIONAL HERNIA REPAIR N/A 05/12/2014   Procedure: OPEN REPAIR OF INCISIONAL HERNIA REPAIR;  Surgeon: Greer Pickerel, MD;  [facility]: [facility];  Service: General;  Laterality: N/A;  . INGUINAL HERNIA REPAIR Left 05/31/2017   Procedure: LEFT INGUINAL HERNIA REPAIR WITH MESH;  Surgeon: Greer Pickerel, MD;  Location: [place];  Service: General;  Laterality: Left;  . INSERTION OF MESH N/A 05/12/2014   Procedure: WITH INSERTION OF MESH;  Surgeon: Greer Pickerel, MD;  [facility];  Service: General;  Laterality: N/A;  .  INSERTION OF MESH Left 05/31/2017   Procedure: INSERTION OF MESH;  Surgeon: Greer Pickerel, MD;  Location: [place];  Service: General;  Laterality: Left;  . KNEE ARTHROSCOPY Left 1990's  . LAPAROSCOPIC APPENDECTOMY N/A 04/07/2013   Procedure: ATTEMPTED APPENDECTOMY LAPAROSCOPIC;  Surgeon: Gayland Curry, MD;  Location: [place];  Service: General;  Laterality: N/A;  . LUNG SURGERY     "repaired ruptured bleb" '95  . MIDDLE EAR SURGERY Left 1972   exploratory tympanotomy/notes 07/17/2000  . NASAL SEPTUM SURGERY    . PARTIAL COLECTOMY  04/07/2013   Procedure: PARTIAL COLECTOMY, ILEOCECTOMY;  Surgeon: Gayland Curry, MD;  Location: [place];  Service: General;;  . SKIN CANCER EXCISION  2000's   "chest"  . TENDON REPAIR Left 1990's   "thumb"   Past Surgical History:  Procedure Laterality Date  . APPENDECTOMY  04/07/2013   Procedure: OPEN APPENDECTOMY;  Surgeon: Gayland Curry, MD;  Location: [place];  Service: General;;  . cardiolyte stress  04/2008  . CHOLECYSTECTOMY  2004  . COLONOSCOPY  06/2005  . ELBOW SURGERY Left 2001   "tendonitis"  . EXTERNAL EAR SURGERY  1971  . INCISIONAL HERNIA REPAIR N/A 05/12/2014   Procedure: OPEN REPAIR OF INCISIONAL HERNIA REPAIR;  Surgeon: Greer Pickerel, MD;  [facility];  Service: General;  Laterality: N/A;  .  INGUINAL HERNIA REPAIR Left 05/31/2017   Procedure: LEFT INGUINAL HERNIA REPAIR WITH MESH;  Surgeon: Greer Pickerel, MD;  Location: [place];  Service: General;  Laterality: Left;  . INSERTION OF MESH N/A 05/12/2014   Procedure: WITH INSERTION OF MESH;  Surgeon: Greer Pickerel, MD;  [facility];  Service: General;  Laterality: N/A;  . INSERTION OF MESH Left 05/31/2017   Procedure: INSERTION OF MESH;  Surgeon: Greer Pickerel, MD;  Location: [place];  Service: General;  Laterality: Left;  . KNEE ARTHROSCOPY Left 1990's  . LAPAROSCOPIC APPENDECTOMY N/A 04/07/2013   Procedure: ATTEMPTED APPENDECTOMY LAPAROSCOPIC;   Surgeon: Gayland Curry, MD;  Location: [place];  Service: General;  Laterality: N/A;  . LUNG SURGERY     "repaired ruptured bleb" '95  . MIDDLE EAR SURGERY Left 1972   exploratory tympanotomy/notes 07/17/2000  . NASAL SEPTUM SURGERY    . PARTIAL COLECTOMY  04/07/2013   Procedure: PARTIAL COLECTOMY, ILEOCECTOMY;  Surgeon: Gayland Curry, MD;  Location: [place];  Service: General;;  . SKIN CANCER EXCISION  2000's   "chest"  . TENDON REPAIR Left 1990's   "thumb"   Family History  Problem Relation Age of Onset  . Healthy Sister   . Lung disease Sister        has blebs on MRI,   . Breast cancer Maternal Aunt        diagnosed in her 8s  . Melanoma Maternal Uncle   . Breast cancer Maternal Aunt        diagnosed in her 71s  . Breast cancer Maternal Aunt        diagnosed in her 42s  . Breast cancer Mother   . Diabetes Mother   . Cancer Mother        breast  . Heart attack Father   . Diabetes Father   . Prostate cancer Father   . Cancer Father   . Healthy Brother   . Prostate cancer Brother   . Healthy Brother   . Cancer Cousin        multiple myeloma  . Colon cancer Neg Hx    Social History   Tobacco Use  . Smoking status: Former Smoker    Packs/day: 2.00    Years: 30.00    Pack years: 60.00    Types: Cigarettes    Last attempt to quit: 07/30/2012    Years since quitting: 5.3  . Smokeless tobacco: Never Used  Substance Use Topics  . Alcohol use: No    Alcohol/week: 0.0 standard drinks  . Drug use: No      Review of Systems Per HPI    Objective:   Physical Exam Physical Exam  Constitutional: Oriented to person, place, and time. He appears well-developed and well-nourished.  HENT:  Head: Normocephalic and atraumatic.  Eyes: Conjunctivae are normal.  Neck: Normal range of motion. Neck supple.  Cardiovascular: Normal rate, regular rhythm and normal heart sounds.   Pulmonary/Chest: Effort normal and breath sounds normal.  Musculoskeletal: Normal range of motion.    Neurological: Alert and oriented to person, place, and time.  Skin: Skin is warm and dry.  Psychiatric: Normal mood and affect. Behavior is normal. Judgment and thought content normal.  Vitals reviewed.   BP (!) 170/86 (BP Location: Left Arm, Patient Position: Sitting, Cuff Size: Large)   Pulse 63   Temp 98.1 F (36.7 C) (Oral)   Ht 5' 10"  (1.778 m)   Wt 196 lb  1.9 oz (89 kg)   SpO2 97%   BMI 28.14 kg/m  Wt Readings from Last 3 Encounters:  12/12/17 196 lb 1.9 oz (89 kg)  12/10/17 195 lb (88.5 kg)  11/05/17 190 lb (86.2 kg)   BP Readings from Last 3 Encounters:  12/12/17 (!) 170/86  12/10/17 (!) 180/110  11/05/17 (!) 159/91       Assessment & Plan:  1. Essential hypertension - discussed treatment options with patient, will add diuretic, follow up in 1 week for BMET, blood pressure recheck - RTC/ER precautions reviewed - hydrochlorothiazide (HYDRODIURIL) 25 MG tablet; Take 1 tablet (25 mg total) by mouth daily.  Dispense: 90 tablet; Refill: [place], FNP-BC  [facility] at [facility], [facility]  12/12/2017 1:35 PM

## 2017-12-12 NOTE — Telephone Encounter (Signed)
Spoke with patient stated that he is doing so much better medication is working. This was a follow up call from visit with [facility].

## 2017-12-12 NOTE — Telephone Encounter (Signed)
Pt called with having an elevated B/P of 180/99 and then 197/93 HR  60. Denies having any symptoms of chest pain,  blurred vision, weakness. He said that he has a sinus headache because he has a sinus infection now and he is taking medication for that now. He thinks he need medication readjustment. Advised if he starts having the above symptoms to call the office back. He has not missed any b/p medication. Appointment scheduled per protocol this morning at [facility] at [facility].   Reason for Disposition . Systolic BP  >= 883 OR Diastolic >= 254  Answer Assessment - Initial Assessment Questions 1. BLOOD PRESSURE: "What is the blood pressure?" "Did you take at least two measurements 5 minutes apart?"     180/99  And now 197/93 HR 60  2. ONSET: "When did you take your blood pressure?"     This morning 3. HOW: "How did you obtain the blood pressure?" (e.g., visiting nurse, automatic home BP monitor)     Automatic home BP monitor 4. HISTORY: "Do you have a history of high blood pressure?"     yes 5. MEDICATIONS: "Are you taking any medications for blood pressure?" "Have you missed any doses recently?"     Yes and not missed any doses recently 6. OTHER SYMPTOMS: "Do you have any symptoms?" (e.g., headache, chest pain, blurred vision, difficulty breathing, weakness)     Headache with the sinus issue that is going on now  Protocols used: HIGH BLOOD PRESSURE-A-AH

## 2017-12-12 NOTE — Telephone Encounter (Signed)
Pt has appt with Glenda Chroman FNP 12/12/17 at 10:15.

## 2017-12-19 ENCOUNTER — Encounter: Payer: Self-pay | Admitting: Family Medicine

## 2017-12-19 ENCOUNTER — Ambulatory Visit (INDEPENDENT_AMBULATORY_CARE_PROVIDER_SITE_OTHER): Payer: 59 | Admitting: Family Medicine

## 2017-12-19 VITALS — BP 138/68 | HR 67 | Temp 98.3°F | Ht 70.0 in | Wt 194.0 lb

## 2017-12-19 DIAGNOSIS — I10 Essential (primary) hypertension: Secondary | ICD-10-CM | POA: Diagnosis not present

## 2017-12-19 LAB — BASIC METABOLIC PANEL
BUN: 18 mg/dL (ref 6–23)
CALCIUM: 9.9 mg/dL (ref 8.4–10.5)
CHLORIDE: 101 meq/L (ref 96–112)
CO2: 31 meq/L (ref 19–32)
Creatinine, Ser: 0.99 mg/dL (ref 0.40–1.50)
GFR: 81.04 mL/min (ref >60.00)
GLUCOSE: 118 mg/dL — AB (ref 70–99)
Potassium: 3.9 mEq/L (ref 3.5–5.1)
Sodium: 141 mEq/L (ref 135–145)

## 2017-12-19 NOTE — Patient Instructions (Signed)
Good to see you today  I am pleased with how your blood pressure has come down  Continue with your current medications  Follow up in 3 months

## 2017-12-19 NOTE — Progress Notes (Signed)
 Subjective:    Patient ID: Ernest Hudson, male    DOB: 06/04/1955, 62 y.o.   MRN: [id number]  HPI This is a 62 yo male who presents today for follow up of HTN. Was seen last week with chronically elevated BP and was started on HCTZ 25 mg. Was already on amlodipine 10 mg. Some increased urination noted.  Was checking bp at home, accidentally threw paper away. Was 140s, was higher on a day he forgot to take his amlodipine. Some increased stress at work.  Cold symptoms improved significantly.   Past Medical History:  Diagnosis Date  . Adrenal mass ([facility])    "benign" (04/08/2013  . Allergic rhinitis   . Arthritis    "probably in my fingers" (04/08/2013)  . Basal cell carcinoma 2000's   'chest"  . Birt-Hogg-Dube syndrome   . Bradycardia   . Dyslipidemia (high LDL; low HDL)   . 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 History of blood transfusion ?1995  . Hx of colonic polyps   . Hyperlipidemia   . Hypertension   . Ileus, postoperative ([facility]) 04/17/2013  . Spontaneous pneumothorax 1995   Past Surgical History:  Procedure Laterality Date  . APPENDECTOMY  04/07/2013   Procedure: OPEN APPENDECTOMY;  Surgeon: Eric M Wilson, MD;  [facility];  Service: General;;  . cardiolyte stress  04/2008  . CHOLECYSTECTOMY  2004  . COLONOSCOPY  06/2005  . ELBOW SURGERY Left 2001   "tendonitis"  . EXTERNAL EAR SURGERY  1971  . INCISIONAL HERNIA REPAIR N/A 05/12/2014   Procedure: OPEN REPAIR OF INCISIONAL HERNIA REPAIR;  Surgeon: Eric Wilson, MD;  [facility];  Service: General;  Laterality: N/A;  . INGUINAL HERNIA REPAIR Left 05/31/2017   Procedure: LEFT INGUINAL HERNIA REPAIR WITH MESH;  Surgeon: Wilson, Eric, MD;  Location: [facility];  Service: General;  Laterality: Left;  . INSERTION OF MESH N/A 05/12/2014   Procedure: WITH INSERTION OF MESH;  Surgeon: Eric Wilson, MD;  [facility];  Service: General;  Laterality: N/A;  . INSERTION OF MESH Left 05/31/2017     Procedure: INSERTION OF MESH;  Surgeon: Wilson, Eric, MD;  Location: [facility];  Service: General;  Laterality: Left;  . KNEE ARTHROSCOPY Left 1990's  . LAPAROSCOPIC APPENDECTOMY N/A 04/07/2013   Procedure: ATTEMPTED APPENDECTOMY LAPAROSCOPIC;  Surgeon: Eric M Wilson, MD;  [facility];  Service: General;  Laterality: N/A;  . LUNG SURGERY     "repaired ruptured bleb" '95  . MIDDLE EAR SURGERY Left 1972   exploratory tympanotomy/notes 07/17/2000  . NASAL SEPTUM SURGERY    . PARTIAL COLECTOMY  04/07/2013   Procedure: PARTIAL COLECTOMY, ILEOCECTOMY;  Surgeon: Eric M Wilson, MD;  [facility];  Service: General;;  . SKIN CANCER EXCISION  2000's   "chest"  . TENDON REPAIR Left 1990's   "thumb"   Family History  Problem Relation Age of Onset  . Healthy Sister   . Lung disease Sister        has blebs on MRI,   . Breast cancer Maternal Aunt        diagnosed in her 60s  . Melanoma Maternal Uncle   . Breast cancer Maternal Aunt        diagnosed in her 60s  . Breast cancer Maternal Aunt        diagnosed in her 60s  . Breast cancer Mother   . Diabetes   Mother   . Cancer Mother        breast  . Heart attack Father   . Diabetes Father   . Prostate cancer Father   . Cancer Father   . Healthy Brother   . Prostate cancer Brother   . Healthy Brother   . Cancer Cousin        multiple myeloma  . Colon cancer Neg Hx    Social History   Tobacco Use  . Smoking status: Former Smoker    Packs/day: 2.00    Years: 30.00    Pack years: 60.00    Types: Cigarettes    Last attempt to quit: 07/30/2012    Years since quitting: 5.3  . Smokeless tobacco: Never Used  Substance Use Topics  . Alcohol use: No    Alcohol/week: 0.0 standard drinks  . Drug use: No      Review of Systems Per HPI    Objective:   Physical Exam  Constitutional: He is oriented to person, place, and time. He appears well-developed and well-nourished. No distress.  HENT:  Head: Normocephalic  and atraumatic.  Eyes: Conjunctivae are normal.  Cardiovascular: Normal rate.  Pulmonary/Chest: Effort normal.  Neurological: He is alert and oriented to person, place, and time.  Skin: He is not diaphoretic.  Psychiatric: He has a normal mood and affect. His behavior is normal. Judgment and thought content normal.  Vitals reviewed.     BP (!) 158/74 (BP Location: Left Arm, Patient Position: Sitting, Cuff Size: Large)   Pulse 67   Temp 98.3 F (36.8 C) (Oral)   Ht 5' 10" (1.778 m)   Wt 194 lb (88 kg)   SpO2 98%   BMI 27.84 kg/m  BP Readings from Last 3 Encounters:  12/19/17 (!) 158/74  12/12/17 (!) 170/86  12/10/17 (!) 180/110   Wt Readings from Last 3 Encounters:  12/19/17 194 lb (88 kg)  12/12/17 196 lb 1.9 oz (89 kg)  12/10/17 195 lb (88.5 kg)     BP: 138/68   Assessment & Plan:  1. Essential hypertension - Basic Metabolic Panel - continue to check home blood pressures, bring log to follow up visit - follow up in 3 months, sooner if BP trending up - encouraged stress reduction  Clarene Reamer, FNP-BC  [facility] at [facility], [place]  12/19/2017 8:49 PM

## 2018-03-22 ENCOUNTER — Ambulatory Visit (INDEPENDENT_AMBULATORY_CARE_PROVIDER_SITE_OTHER): Payer: 59 | Admitting: Family Medicine

## 2018-03-22 VITALS — BP 138/86 | HR 54 | Temp 98.4°F | Ht 70.0 in | Wt 201.0 lb

## 2018-03-22 DIAGNOSIS — Z23 Encounter for immunization: Secondary | ICD-10-CM | POA: Diagnosis not present

## 2018-03-22 DIAGNOSIS — I10 Essential (primary) hypertension: Secondary | ICD-10-CM | POA: Diagnosis not present

## 2018-03-22 DIAGNOSIS — L409 Psoriasis, unspecified: Secondary | ICD-10-CM | POA: Diagnosis not present

## 2018-03-22 MED ORDER — TRIAMCINOLONE ACETONIDE 0.1 % EX CREA: 1.0000 "application " | TOPICAL_CREAM | Freq: Two times a day (BID) | CUTANEOUS | 1 refills | Status: DC

## 2018-03-22 NOTE — Patient Instructions (Signed)
Good to see you today  I have sent in your steroid cream, use sparingly twice a day for no more than 14 days in a row

## 2018-03-22 NOTE — Progress Notes (Signed)
Subjective:    Patient ID: Ernest Hudson, male    DOB: August 06, 1955, 63 y.o.   MRN: [id number]  HPI This is a 63 yo male who presents today for follow up of HTN. Tolerating medication well (amlodipine, hctz). No side effects since adding HCTZ. Does not check blood pressure at home. No chest pain, no SOB, no dizziness/lightheadedness.   Left leg psoriasis. Chronic. Good resolution with steroid cream. Out of steroid cream. Requests refill.   Past Medical History:  Diagnosis Date  . 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 Allergic rhinitis   . Arthritis    "probably in my fingers" (04/08/2013)  . Basal cell carcinoma 2000's   'chest"  . Birt-Hogg-Dube syndrome   . Bradycardia   . Dyslipidemia (high LDL; low HDL)   . 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 History of blood transfusion ?1995  . Hx of colonic polyps   . Hyperlipidemia   . Hypertension   . Ileus, postoperative ([place]) 04/17/2013  . Spontaneous pneumothorax 1995   Past Surgical History:  Procedure Laterality Date  . APPENDECTOMY  04/07/2013   Procedure: OPEN APPENDECTOMY;  Surgeon: Gayland Curry, MD;  Location: [place];  Service: General;;  . cardiolyte stress  04/2008  . CHOLECYSTECTOMY  2004  . COLONOSCOPY  06/2005  . ELBOW SURGERY Left 2001   "tendonitis"  . EXTERNAL EAR SURGERY  1971  . INCISIONAL HERNIA REPAIR N/A 05/12/2014   Procedure: OPEN REPAIR OF INCISIONAL HERNIA REPAIR;  Surgeon: Greer Pickerel, MD;  Location[facility];  Service: General;  Laterality: N/A;  . INGUINAL HERNIA REPAIR Left 05/31/2017   Procedure: LEFT INGUINAL HERNIA REPAIR WITH MESH;  Surgeon: Greer Pickerel, MD;  Location: [place];  Service: General;  Laterality: Left;  . INSERTION OF MESH N/A 05/12/2014   Procedure: WITH INSERTION OF MESH;  Surgeon: Greer Pickerel, MD;  Location[facility];  Service: General;  Laterality: N/A;  . INSERTION OF MESH Left 05/31/2017   Procedure: INSERTION OF MESH;  Surgeon: Greer Pickerel, MD;  Location: [place];  Service: General;  Laterality: Left;  . KNEE ARTHROSCOPY Left 1990's  . LAPAROSCOPIC APPENDECTOMY N/A 04/07/2013   Procedure: ATTEMPTED APPENDECTOMY LAPAROSCOPIC;  Surgeon: Gayland Curry, MD;  Location: [place];  Service: General;  Laterality: N/A;  . LUNG SURGERY     "repaired ruptured bleb" '95  . MIDDLE EAR SURGERY Left 1972   exploratory tympanotomy/notes 07/17/2000  . NASAL SEPTUM SURGERY    . PARTIAL COLECTOMY  04/07/2013   Procedure: PARTIAL COLECTOMY, ILEOCECTOMY;  Surgeon: Gayland Curry, MD;  Location: [place];  Service: General;;  . SKIN CANCER EXCISION  2000's   "chest"  . TENDON REPAIR Left 1990's   "thumb"   Family History  Problem Relation Age of Onset  . Healthy Sister   . Lung disease Sister        has blebs on MRI,   . Breast cancer Maternal Aunt        diagnosed in her 40s  . Melanoma Maternal Uncle   . Breast cancer Maternal Aunt        diagnosed in her 56s  . Breast cancer Maternal Aunt        diagnosed in her 62s  . Breast cancer Mother   . Diabetes Mother   . Cancer Mother        breast  . Heart  attack Father   . Diabetes Father   . Prostate cancer Father   . Cancer Father   . Healthy Brother   . Prostate cancer Brother   . Healthy Brother   . Cancer Cousin        multiple myeloma  . Colon cancer Neg Hx    Social History   Tobacco Use  . Smoking status: Former Smoker    Packs/day: 2.00    Years: 30.00    Pack years: 60.00    Types: Cigarettes    Last attempt to quit: 07/30/2012    Years since quitting: 5.6  . Smokeless tobacco: Never Used  Substance Use Topics  . Alcohol use: No    Alcohol/week: 0.0 standard drinks  . Drug use: No      Review of Systems    per HPI Objective:   Physical Exam Vitals signs reviewed.  Constitutional:      General: He is not in acute distress.    Appearance: Normal appearance. He is normal weight. He is not ill-appearing or toxic-appearing.  HENT:      Head: Normocephalic and atraumatic.  Cardiovascular:     Rate and Rhythm: Normal rate.  Pulmonary:     Effort: Pulmonary effort is normal.  Skin:    General: Skin is warm and dry.     Comments: Left lateral lower leg with rectangular patch of thick scaling, mild erythema.   Neurological:     Mental Status: He is alert and oriented to person, place, and time.  Psychiatric:        Mood and Affect: Mood normal.        Behavior: Behavior normal.        Thought Content: Thought content normal.        Judgment: Judgment normal.       BP 138/86 (BP Location: Right Arm, Cuff Size: Large)   Pulse (!) 54   Temp 98.4 F (36.9 C) (Oral)   Ht 5' 10"  (1.778 m)   Wt 201 lb (91.2 kg)   SpO2 98%   BMI 28.84 kg/m  Wt Readings from Last 3 Encounters:  03/22/18 201 lb (91.2 kg)  12/19/17 194 lb (88 kg)  12/12/17 196 lb 1.9 oz (89 kg)   BP Readings from Last 3 Encounters:  03/22/18 138/86  12/19/17 138/68  12/12/17 (!) 170/86       Assessment & Plan:  1. Psoriasis - triamcinolone cream (KENALOG) 0.1 %; Apply 1 application topically 2 (two) times daily. For no more than 14 days in a row.  Dispense: 45 g; Refill: 1  2. Essential hypertension - well controlled on current meds  3. Need for influenza vaccination - Flu Vaccine QUAD 6+ mos PF IM (Fluarix Quad PF)  - follow up with PCP for CPE  Clarene Reamer, FNP-BC   [facility], [place]  03/24/2018 7:04 PM

## 2018-03-24 ENCOUNTER — Encounter: Payer: Self-pay | Admitting: Family Medicine

## 2018-05-06 ENCOUNTER — Telehealth: Payer: Self-pay | Admitting: *Deleted

## 2018-05-06 MED ORDER — LISINOPRIL-HYDROCHLOROTHIAZIDE 20-25 MG PO TABS: 1.0000 | ORAL_TABLET | Freq: Every day | ORAL | 0 refills | Status: DC

## 2018-05-06 NOTE — Telephone Encounter (Signed)
Spoke to pt. Made appt 05-27-18 at 10:30. New rx sent to [facility].

## 2018-05-06 NOTE — Telephone Encounter (Signed)
I think it would be best to increase his regimen--though I want him to bring in his cuffs to make sure they correlate with ours.  Have him stop the HCTZ and instead take lisinopril 20/25 (#90 x 0) Schedule follow up in 3-4 weeks Have him let me know if any problems like cough or dizziness. He needs to bring in his cuffs and BP readings to that appt

## 2018-05-06 NOTE — Telephone Encounter (Signed)
Patient called stating that he has concerns about his blood pressure. Patient stated that he checked his blood pressure last week and it was 165/85. Patient stated that he checked it this morning two times with different machines and he got 173/93 and 165/85. Patient denies having any symptoms. Patient stated that he does occasionally miss his medication, but had not in at least 3 weeks. Patient wants to know if he needs to make adjustments on his medication? Pharmacy [facility]

## 2018-05-27 ENCOUNTER — Ambulatory Visit: Payer: 59 | Admitting: Internal Medicine

## 2018-06-22 MED FILL — HYDROCHLOROTHIAZIDE 25 MG T: 25 | 90 days supply | Qty: 90 | Fill #0

## 2018-06-22 MED FILL — TRIAMCINOLONE 0.1% CREAM: 0.1 | 14 days supply | Qty: 45 | Fill #0

## 2018-07-30 ENCOUNTER — Other Ambulatory Visit: Payer: Self-pay | Admitting: Internal Medicine

## 2018-07-31 ENCOUNTER — Other Ambulatory Visit: Payer: Self-pay

## 2018-07-31 ENCOUNTER — Ambulatory Visit (INDEPENDENT_AMBULATORY_CARE_PROVIDER_SITE_OTHER): Payer: 59 | Admitting: Internal Medicine

## 2018-07-31 ENCOUNTER — Encounter: Payer: Self-pay | Admitting: Internal Medicine

## 2018-07-31 VITALS — BP 130/88 | HR 70 | Temp 98.3°F | Ht 70.0 in | Wt 199.0 lb

## 2018-07-31 DIAGNOSIS — I10 Essential (primary) hypertension: Secondary | ICD-10-CM | POA: Diagnosis not present

## 2018-07-31 NOTE — Assessment & Plan Note (Signed)
BP Readings from Last 3 Encounters:  07/31/18 130/88  03/22/18 138/86  12/19/17 138/68   Good control now No changes Discussed fitness, etc

## 2018-07-31 NOTE — Patient Instructions (Signed)
DASH Eating Plan  DASH stands for "Dietary Approaches to Stop Hypertension." The DASH eating plan is a healthy eating plan that has been shown to reduce high blood pressure (hypertension). It may also reduce your risk for type 2 diabetes, heart disease, and stroke. The DASH eating plan may also help with weight loss.  What are tips for following this plan?    General guidelines   Avoid eating more than 2,300 mg (milligrams) of salt (sodium) a day. If you have hypertension, you may need to reduce your sodium intake to 1,500 mg a day.   Limit alcohol intake to no more than 1 drink a day for nonpregnant women and 2 drinks a day for men. One drink equals 12 oz of beer, 5 oz of wine, or 1 oz of hard liquor.   Work with your health care provider to maintain a healthy body weight or to lose weight. Ask what an ideal weight is for you.   Get at least 30 minutes of exercise that causes your heart to beat faster (aerobic exercise) most days of the week. Activities may include walking, swimming, or biking.   Work with your health care provider or diet and nutrition specialist (dietitian) to adjust your eating plan to your individual calorie needs.  Reading food labels     Check food labels for the amount of sodium per serving. Choose foods with less than 5 percent of the Daily Value of sodium. Generally, foods with less than 300 mg of sodium per serving fit into this eating plan.   To find whole grains, look for the word "whole" as the first word in the ingredient list.  Shopping   Buy products labeled as "low-sodium" or "no salt added."   Buy fresh foods. Avoid canned foods and premade or frozen meals.  Cooking   Avoid adding salt when cooking. Use salt-free seasonings or herbs instead of table salt or sea salt. Check with your health care provider or pharmacist before using salt substitutes.   Do not fry foods. Cook foods using healthy methods such as baking, boiling, grilling, and broiling instead.   Cook with  heart-healthy oils, such as olive, canola, soybean, or sunflower oil.  Meal planning   Eat a balanced diet that includes:  ? 5 or more servings of fruits and vegetables each day. At each meal, try to fill half of your plate with fruits and vegetables.  ? Up to 6-8 servings of whole grains each day.  ? Less than 6 oz of lean meat, poultry, or fish each day. A 3-oz serving of meat is about the same size as a deck of cards. One egg equals 1 oz.  ? 2 servings of low-fat dairy each day.  ? A serving of nuts, seeds, or beans 5 times each week.  ? Heart-healthy fats. Healthy fats called Omega-3 fatty acids are found in foods such as flaxseeds and coldwater fish, like sardines, salmon, and mackerel.   Limit how much you eat of the following:  ? Canned or prepackaged foods.  ? Food that is high in trans fat, such as fried foods.  ? Food that is high in saturated fat, such as fatty meat.  ? Sweets, desserts, sugary drinks, and other foods with added sugar.  ? Full-fat dairy products.   Do not salt foods before eating.   Try to eat at least 2 vegetarian meals each week.   Eat more home-cooked food and less restaurant, buffet, and fast food.     When eating at a restaurant, ask that your food be prepared with less salt or no salt, if possible.  What foods are recommended?  The items listed may not be a complete list. Talk with your dietitian about what dietary choices are best for you.  Grains  Whole-grain or whole-wheat bread. Whole-grain or whole-wheat pasta. Brown rice. Oatmeal. Quinoa. Bulgur. Whole-grain and low-sodium cereals. Pita bread. Low-fat, low-sodium crackers. Whole-wheat flour tortillas.  Vegetables  Fresh or frozen vegetables (raw, steamed, roasted, or grilled). Low-sodium or reduced-sodium tomato and vegetable juice. Low-sodium or reduced-sodium tomato sauce and tomato paste. Low-sodium or reduced-sodium canned vegetables.  Fruits  All fresh, dried, or frozen fruit. Canned fruit in natural juice (without  added sugar).  Meat and other protein foods  Skinless chicken or turkey. Ground chicken or turkey. Pork with fat trimmed off. Fish and seafood. Egg whites. Dried beans, peas, or lentils. Unsalted nuts, nut butters, and seeds. Unsalted canned beans. Lean cuts of beef with fat trimmed off. Low-sodium, lean deli meat.  Dairy  Low-fat (1%) or fat-free (skim) milk. Fat-free, low-fat, or reduced-fat cheeses. Nonfat, low-sodium ricotta or cottage cheese. Low-fat or nonfat yogurt. Low-fat, low-sodium cheese.  Fats and oils  Soft margarine without trans fats. Vegetable oil. Low-fat, reduced-fat, or light mayonnaise and salad dressings (reduced-sodium). Canola, safflower, olive, soybean, and sunflower oils. Avocado.  Seasoning and other foods  Herbs. Spices. Seasoning mixes without salt. Unsalted popcorn and pretzels. Fat-free sweets.  What foods are not recommended?  The items listed may not be a complete list. Talk with your dietitian about what dietary choices are best for you.  Grains  Baked goods made with fat, such as croissants, muffins, or some breads. Dry pasta or rice meal packs.  Vegetables  Creamed or fried vegetables. Vegetables in a cheese sauce. Regular canned vegetables (not low-sodium or reduced-sodium). Regular canned tomato sauce and paste (not low-sodium or reduced-sodium). Regular tomato and vegetable juice (not low-sodium or reduced-sodium). Pickles. Olives.  Fruits  Canned fruit in a light or heavy syrup. Fried fruit. Fruit in cream or butter sauce.  Meat and other protein foods  Fatty cuts of meat. Ribs. Fried meat. Bacon. Sausage. Bologna and other processed lunch meats. Salami. Fatback. Hotdogs. Bratwurst. Salted nuts and seeds. Canned beans with added salt. Canned or smoked fish. Whole eggs or egg yolks. Chicken or turkey with skin.  Dairy  Whole or 2% milk, cream, and half-and-half. Whole or full-fat cream cheese. Whole-fat or sweetened yogurt. Full-fat cheese. Nondairy creamers. Whipped toppings.  Processed cheese and cheese spreads.  Fats and oils  Butter. Stick margarine. Lard. Shortening. Ghee. Bacon fat. Tropical oils, such as coconut, palm kernel, or palm oil.  Seasoning and other foods  Salted popcorn and pretzels. Onion salt, garlic salt, seasoned salt, table salt, and sea salt. Worcestershire sauce. Tartar sauce. Barbecue sauce. Teriyaki sauce. Soy sauce, including reduced-sodium. Steak sauce. Canned and packaged gravies. Fish sauce. Oyster sauce. Cocktail sauce. Horseradish that you find on the shelf. Ketchup. Mustard. Meat flavorings and tenderizers. Bouillon cubes. Hot sauce and Tabasco sauce. Premade or packaged marinades. Premade or packaged taco seasonings. Relishes. Regular salad dressings.  Where to find more information:   National Heart, Lung, and Blood Institute: www.nhlbi.nih.gov   American Heart Association: www.heart.org  Summary   The DASH eating plan is a healthy eating plan that has been shown to reduce high blood pressure (hypertension). It may also reduce your risk for type 2 diabetes, heart disease, and stroke.   With the   DASH eating plan, you should limit salt (sodium) intake to 2,300 mg a day. If you have hypertension, you may need to reduce your sodium intake to 1,500 mg a day.   When on the DASH eating plan, aim to eat more fresh fruits and vegetables, whole grains, lean proteins, low-fat dairy, and heart-healthy fats.   Work with your health care provider or diet and nutrition specialist (dietitian) to adjust your eating plan to your individual calorie needs.  This information is not intended to replace advice given to you by your health care provider. Make sure you discuss any questions you have with your health care provider.  Document Released: 01/26/2011 Document Revised: 01/31/2016 Document Reviewed: 01/31/2016  Elsevier Interactive Patient Education  2019 Elsevier Inc.

## 2018-07-31 NOTE — Progress Notes (Signed)
Subjective:    Patient ID: Ernest Hudson, male    DOB: 02/22/1955, 63 y.o.   MRN: [id number]  HPI He is here for follow up of hypertension  No problems with the medications Checks at home at times---doesn't remember but thinks they were okay Not really exercising--but walks a lot His work is the same  No chest pain No palpitations No dizziness but his balance is not as good as it was No SOB or change in exercise tolerance  Current Outpatient Medications on File Prior to Visit  Medication Sig Dispense Refill  . amLODipine (NORVASC) 10 MG tablet TAKE 1 TABLET BY MOUTH ONCE DAILY 90 tablet 3  . cetirizine (ZYRTEC) 10 MG tablet Take 10 mg by mouth every morning.     . fluticasone (FLONASE) 50 MCG/ACT nasal spray Place 2 sprays into both nostrils daily as needed. For allergies. 16 g 6  . ibuprofen (ADVIL,MOTRIN) 200 MG tablet Take 3 tablets (600 mg total) by mouth every 6 (six) hours as needed. You can take 2-3 of these every 6 hours as needed. 20 tablet 0  . lisinopril-hydrochlorothiazide (ZESTORETIC) 20-25 MG tablet TAKE 1 TABLET BY MOUTH DAILY. 90 tablet 0  . Multiple Vitamin (MULTIVITAMIN) tablet Take 1 tablet by mouth daily.    . Omega-3 Fatty Acids (OMEGA 3 PO) Take 1 tablet by mouth daily.     Marland Kitchen triamcinolone cream (KENALOG) 0.1 % Apply 1 application topically 2 (two) times daily. For no more than 14 days in a row. 45 g 1   No current facility-administered medications on file prior to visit.     Allergies  Allergen Reactions  . Lipitor [Atorvastatin]   . Niacin     REACTION: rash    Past Medical History:  Diagnosis Date  . 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 Allergic rhinitis   . Arthritis    "probably in my fingers" (04/08/2013)  . Basal cell carcinoma 2000's   'chest"  . Birt-Hogg-Dube syndrome   . Bradycardia   . Dyslipidemia (high LDL; low HDL)   . 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 History of blood transfusion ?1995  . Hx of  colonic polyps   . Hyperlipidemia   . Hypertension   . Ileus, postoperative ([place]) 04/17/2013  . Spontaneous pneumothorax 1995    Past Surgical History:  Procedure Laterality Date  . APPENDECTOMY  04/07/2013   Procedure: OPEN APPENDECTOMY;  Surgeon: Gayland Curry, MD;  Location: [place];  Service: General;;  . cardiolyte stress  04/2008  . CHOLECYSTECTOMY  2004  . COLONOSCOPY  06/2005  . ELBOW SURGERY Left 2001   "tendonitis"  . EXTERNAL EAR SURGERY  1971  . INCISIONAL HERNIA REPAIR N/A 05/12/2014   Procedure: OPEN REPAIR OF INCISIONAL HERNIA REPAIR;  Surgeon: Greer Pickerel, MD;  [facility];  Service: General;  Laterality: N/A;  . INGUINAL HERNIA REPAIR Left 05/31/2017   Procedure: LEFT INGUINAL HERNIA REPAIR WITH MESH;  Surgeon: Greer Pickerel, MD;  Location: [place];  Service: General;  Laterality: Left;  . INSERTION OF MESH N/A 05/12/2014   Procedure: WITH INSERTION OF MESH;  Surgeon: Greer Pickerel, MD;  [facility];  Service: General;  Laterality: N/A;  . INSERTION OF MESH Left 05/31/2017   Procedure: INSERTION OF MESH;  Surgeon: Greer Pickerel, MD;  Location: [place];  Service: General;  Laterality: Left;  . KNEE ARTHROSCOPY Left  1990's  . LAPAROSCOPIC APPENDECTOMY N/A 04/07/2013   Procedure: ATTEMPTED APPENDECTOMY LAPAROSCOPIC;  Surgeon: Gayland Curry, MD;  Location: [place];  Service: General;  Laterality: N/A;  . LUNG SURGERY     "repaired ruptured bleb" '95  . MIDDLE EAR SURGERY Left 1972   exploratory tympanotomy/notes 07/17/2000  . NASAL SEPTUM SURGERY    . PARTIAL COLECTOMY  04/07/2013   Procedure: PARTIAL COLECTOMY, ILEOCECTOMY;  Surgeon: Gayland Curry, MD;  Location: [place];  Service: General;;  . SKIN CANCER EXCISION  2000's   "chest"  . TENDON REPAIR Left 1990's   "thumb"    Family History  Problem Relation Age of Onset  . Healthy Sister   . Lung disease Sister        has blebs on MRI,   . Breast cancer Maternal Aunt         diagnosed in her 12s  . Melanoma Maternal Uncle   . Breast cancer Maternal Aunt        diagnosed in her 62s  . Breast cancer Maternal Aunt        diagnosed in her 3s  . Breast cancer Mother   . Diabetes Mother   . Cancer Mother        breast  . Heart attack Father   . Diabetes Father   . Prostate cancer Father   . Cancer Father   . Healthy Brother   . Prostate cancer Brother   . Healthy Brother   . Cancer Cousin        multiple myeloma  . Colon cancer Neg Hx     Social History   Socioeconomic History  . Marital status: Married    Spouse name: Not on file  . Number of children: 4  . Years of education: Not on file  . Highest education level: Not on file  Occupational History  . 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 Financial resource strain: Not on file  . Food insecurity:    Worry: Not on file    Inability: Not on file  . Transportation needs:    Medical: Not on file    Non-medical: Not on file  Tobacco Use  . Smoking status: Former Smoker    Packs/day: 2.00    Years: 30.00    Pack years: 60.00    Types: Cigarettes    Last attempt to quit: 07/30/2012    Years since quitting: 6.0  . Smokeless tobacco: Never Used  Substance and Sexual Activity  . Alcohol use: No    Alcohol/week: 0.0 standard drinks  . Drug use: No  . Sexual activity: Not Currently  Lifestyle  . Physical activity:    Days per week: Not on file    Minutes per session: Not on file  . Stress: Not on file  Relationships  . Social connections:    Talks on phone: Not on file    Gets together: Not on file    Attends religious service: Not on file    Active member of club or organization: Not on file    Attends meetings of clubs or organizations: Not on file    Relationship status: Not on file  . Intimate partner violence:    Fear of current or ex partner: Not on file    Emotionally abused: Not on file    Physically abused: Not on file    Forced sexual  activity: Not on file  Other Topics Concern  .  Not on file  Social History Narrative   Married--1 daughter and 3 step daughters   Review of Systems Weight stable Sleeps well Only has right ear---fading. Discussed evaluation for aides 2 boils on back in the past few months--wife helped him drain them    Objective:   Physical Exam  Constitutional: He appears well-developed. No distress.  Neck: No thyromegaly present.  Cardiovascular: Normal rate, regular rhythm and normal heart sounds. Exam reveals no gallop.  No murmur heard. Respiratory: Effort normal and breath sounds normal. No respiratory distress. He has no wheezes. He has no rales.  Musculoskeletal:        General: No edema.  Lymphadenopathy:    He has no cervical adenopathy.  Skin:  ?prior infection spot is likely a cyst           Assessment & Plan:

## 2018-10-23 ENCOUNTER — Encounter: Payer: 59 | Admitting: Internal Medicine

## 2018-10-25 ENCOUNTER — Encounter: Payer: 59 | Admitting: Internal Medicine

## 2018-11-04 ENCOUNTER — Telehealth: Payer: Self-pay

## 2018-11-04 ENCOUNTER — Other Ambulatory Visit: Payer: Self-pay | Admitting: Internal Medicine

## 2018-11-04 MED ORDER — AMLODIPINE BESYLATE 10 MG PO TABS: ORAL_TABLET | ORAL | 0 refills | Status: DC

## 2018-11-04 NOTE — Telephone Encounter (Signed)
Pt is at [facility] and just realized he needs amlodipine 10 mg # 5 taking one daily to have enough med to last until comes home this weekend. Pt has his other meds. Refilled amlodipine 10 mg # 5 to [facility] at [address]; pt voiced understanding and appreciative. Nothing further needed.

## 2019-01-06 IMAGING — DX DG CHEST 2V
2 series · 2 of 2 positions shown · non-contrast
Comparison: 05/22/2016

CLINICAL DATA: Follow-up pneumothorax

EXAM:
CHEST  2 VIEW

[dg chest 2 view (1 of 2)]
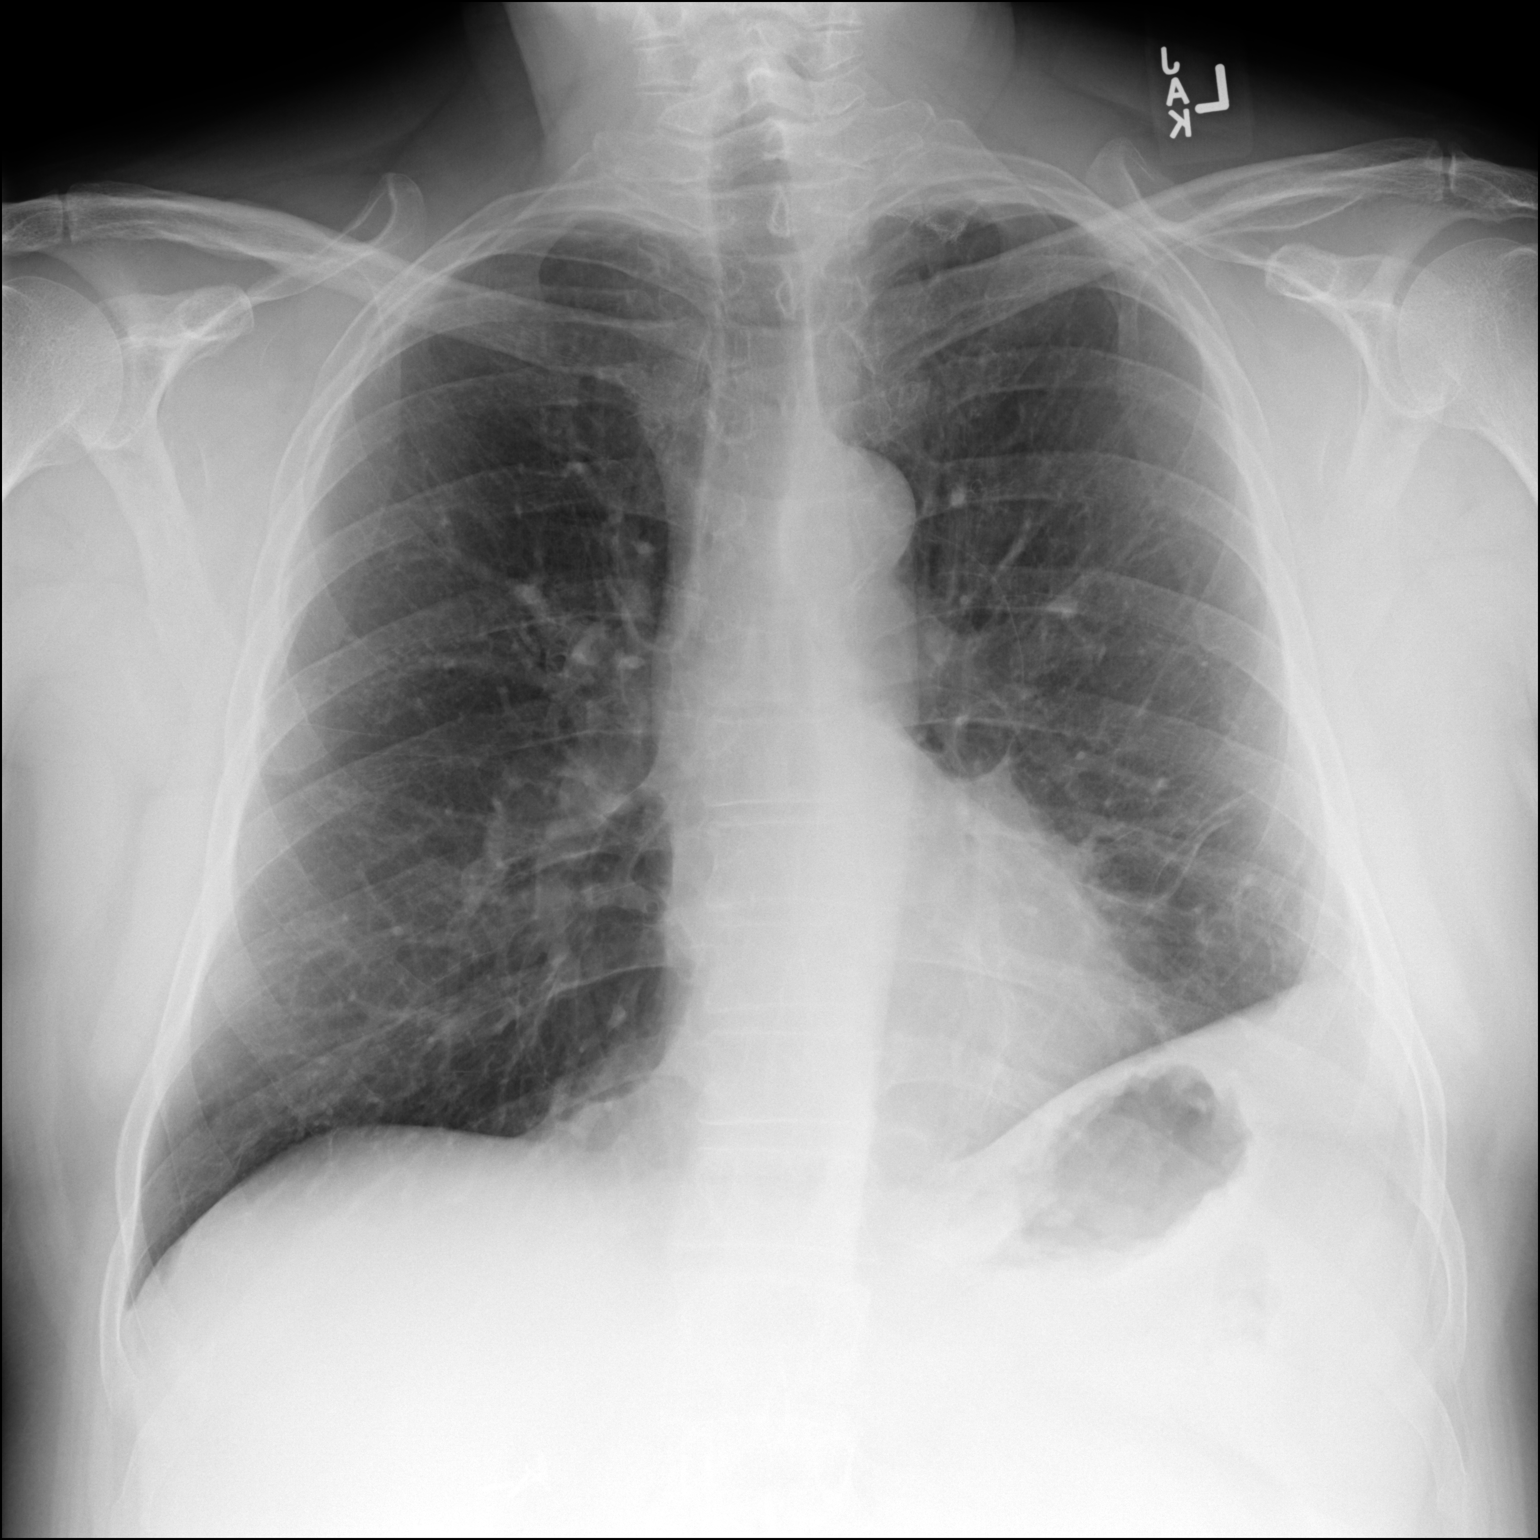

[dg chest 2 view (2 of 2)]
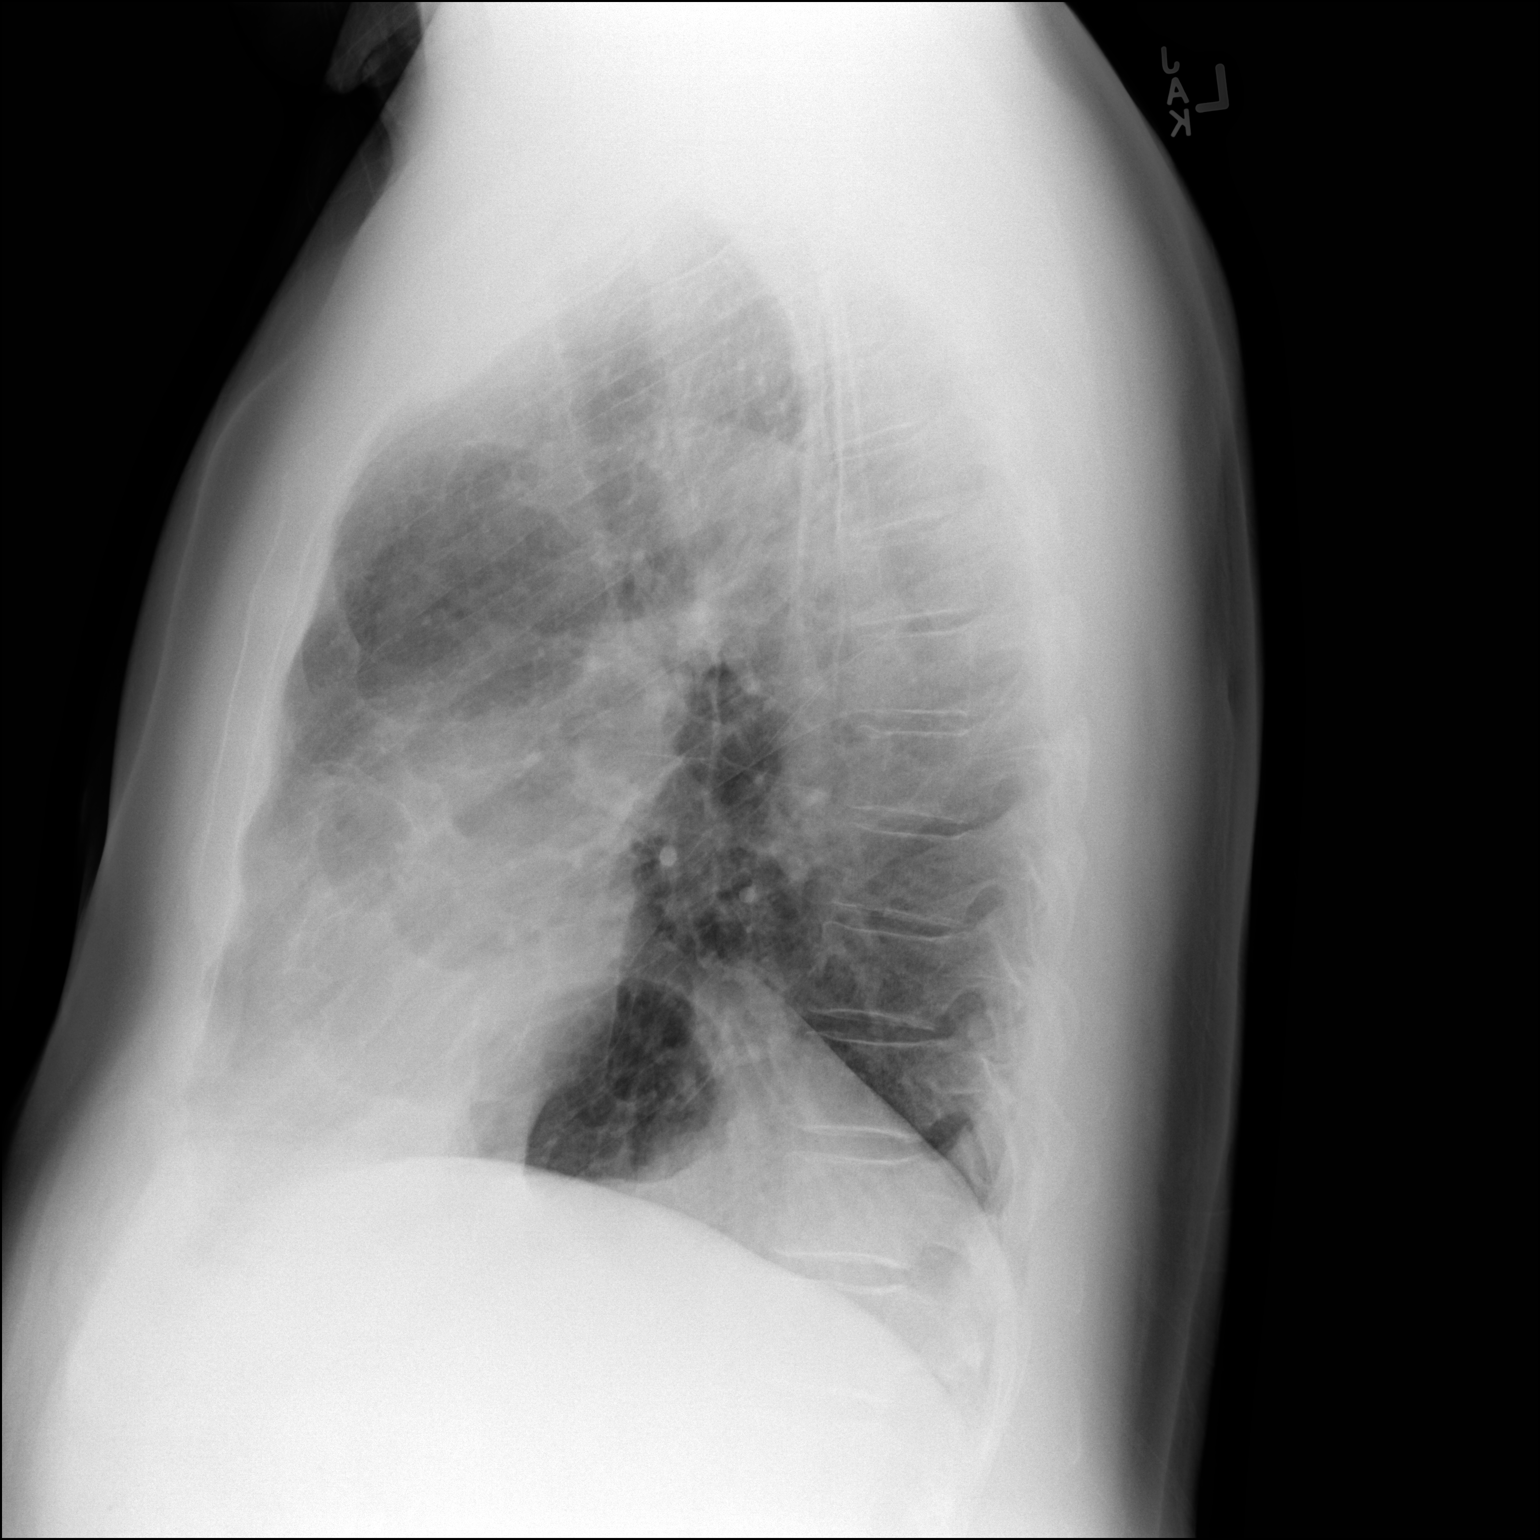

[2 of 2 positions shown; findings below may reference images not displayed]

FINDINGS: Postoperative left chest with apical sutures, volume loss, and
pleural based scarring. No visible pneumothorax. Bilateral bowl and.
No visible pneumothorax. Normal heart size and aortic contours.
IMPRESSION: 1. No visible pneumothorax.
2. Bullae and left-sided scarring.

## 2019-01-07 DIAGNOSIS — L409 Psoriasis, unspecified: Secondary | ICD-10-CM | POA: Diagnosis not present

## 2019-01-07 DIAGNOSIS — L821 Other seborrheic keratosis: Secondary | ICD-10-CM | POA: Diagnosis not present

## 2019-01-07 DIAGNOSIS — L570 Actinic keratosis: Secondary | ICD-10-CM | POA: Diagnosis not present

## 2019-01-07 DIAGNOSIS — L814 Other melanin hyperpigmentation: Secondary | ICD-10-CM | POA: Diagnosis not present

## 2019-01-07 DIAGNOSIS — D225 Melanocytic nevi of trunk: Secondary | ICD-10-CM | POA: Diagnosis not present

## 2019-01-07 DIAGNOSIS — Z86018 Personal history of other benign neoplasm: Secondary | ICD-10-CM | POA: Diagnosis not present

## 2019-01-07 DIAGNOSIS — Z85828 Personal history of other malignant neoplasm of skin: Secondary | ICD-10-CM | POA: Diagnosis not present

## 2019-02-07 ENCOUNTER — Ambulatory Visit (INDEPENDENT_AMBULATORY_CARE_PROVIDER_SITE_OTHER): Payer: 59 | Admitting: Internal Medicine

## 2019-02-07 ENCOUNTER — Encounter: Payer: Self-pay | Admitting: Internal Medicine

## 2019-02-07 ENCOUNTER — Other Ambulatory Visit: Payer: Self-pay

## 2019-02-07 VITALS — BP 134/84 | HR 54 | Temp 98.3°F | Ht 70.0 in | Wt 198.0 lb

## 2019-02-07 DIAGNOSIS — Z0000 Encounter for general adult medical examination without abnormal findings: Secondary | ICD-10-CM | POA: Diagnosis not present

## 2019-02-07 DIAGNOSIS — Z125 Encounter for screening for malignant neoplasm of prostate: Secondary | ICD-10-CM

## 2019-02-07 DIAGNOSIS — Z23 Encounter for immunization: Secondary | ICD-10-CM

## 2019-02-07 DIAGNOSIS — I10 Essential (primary) hypertension: Secondary | ICD-10-CM | POA: Diagnosis not present

## 2019-02-07 LAB — COMPREHENSIVE METABOLIC PANEL
ALT: 20 U/L (ref 0–53)
AST: 17 U/L (ref 0–37)
Albumin: 4.6 g/dL (ref 3.5–5.2)
Alkaline Phosphatase: 71 U/L (ref 39–117)
BUN: 16 mg/dL (ref 6–23)
CO2: 32 mEq/L (ref 19–32)
Calcium: 9.7 mg/dL (ref 8.4–10.5)
Chloride: 102 mEq/L (ref 96–112)
Creatinine, Ser: 1.09 mg/dL (ref 0.40–1.50)
GFR: 67.99 mL/min (ref >60.00)
Glucose, Bld: 101 mg/dL — ABNORMAL HIGH (ref 70–99)
Potassium: 4.7 mEq/L (ref 3.5–5.1)
Sodium: 139 mEq/L (ref 135–145)
Total Bilirubin: 0.7 mg/dL (ref 0.2–1.2)
Total Protein: 7 g/dL (ref 6.0–8.3)

## 2019-02-07 LAB — CBC
HCT: 46.7 % (ref 39.0–52.0)
Hemoglobin: 16.2 g/dL (ref 13.0–17.0)
MCHC: 34.6 g/dL (ref 30.0–36.0)
MCV: 89.2 fl (ref 78.0–100.0)
Platelets: 228 10*3/uL (ref 150.0–400.0)
RBC: 5.24 Mil/uL (ref 4.22–5.81)
RDW: 13.3 % (ref 11.5–15.5)
WBC: 5.7 10*3/uL (ref 4.0–10.5)

## 2019-02-07 LAB — PSA: PSA: 3.81 ng/mL (ref 0.10–4.00)

## 2019-02-07 NOTE — Addendum Note (Signed)
Addended by: Pilar Grammes on: 02/07/2019 08:56 AM   Modules accepted: Orders

## 2019-02-07 NOTE — Progress Notes (Signed)
Subjective:    Patient ID: Ernest Hudson, male    DOB: 12/03/1955, 63 y.o.   MRN: [id number]  HPI Here for physical   This visit occurred during the SARS-CoV-2 public health emergency.  Safety protocols were in place, including screening questions prior to the visit, additional usage of staff PPE, and extensive cleaning of exam room while observing appropriate contact time as indicated for disinfecting solutions.   Is concerned about his prostate FH of cancer in dad and brother  Now thinks he has RIH---had repair on left Occasional burning sensation  Having leg cramps many nights Will feel a "ball" in the right calf Has to walk it out Mustard helps but he doesn't like the taste (discussed vinegar)  Current Outpatient Medications on File Prior to Visit  Medication Sig Dispense Refill  . amLODipine (NORVASC) 10 MG tablet TAKE 1 TABLET BY MOUTH ONCE DAILY 90 tablet 3  . cetirizine (ZYRTEC) 10 MG tablet Take 10 mg by mouth every morning.     . fluticasone (FLONASE) 50 MCG/ACT nasal spray Place 2 sprays into both nostrils daily as needed. For allergies. 16 g 6  . ibuprofen (ADVIL,MOTRIN) 200 MG tablet Take 3 tablets (600 mg total) by mouth every 6 (six) hours as needed. You can take 2-3 of these every 6 hours as needed. 20 tablet 0  . lisinopril-hydrochlorothiazide (ZESTORETIC) 20-25 MG tablet TAKE 1 TABLET BY MOUTH DAILY. 90 tablet 3  . MAGNESIUM PO Take by mouth.    . Multiple Vitamin (MULTIVITAMIN) tablet Take 1 tablet by mouth daily. Multiple different supplements    . Multiple Vitamins-Minerals (ZINC PO) Take by mouth.    . Omega-3 Fatty Acids (OMEGA 3 PO) Take 1 tablet by mouth daily.     Marland Kitchen POTASSIUM PO Take by mouth.    . triamcinolone cream (KENALOG) 0.1 % Apply 1 application topically 2 (two) times daily. For no more than 14 days in a row. 45 g 1  . VITAMIN D PO Take by mouth.     No current facility-administered medications on file prior to visit.    Allergies  Allergen  Reactions  . Lipitor [Atorvastatin]   . Niacin     REACTION: rash    Past Medical History:  Diagnosis Date  . 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 Allergic rhinitis   . Arthritis    "probably in my fingers" (04/08/2013)  . Basal cell carcinoma 2000's   'chest"  . Birt-Hogg-Dube syndrome   . Bradycardia   . Dyslipidemia (high LDL; low HDL)   . 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 History of blood transfusion ?1995  . Hx of colonic polyps   . Hyperlipidemia   . Hypertension   . Ileus, postoperative ([place]) 04/17/2013  . Spontaneous pneumothorax 1995    Past Surgical History:  Procedure Laterality Date  . APPENDECTOMY  04/07/2013   Procedure: OPEN APPENDECTOMY;  Surgeon: Gayland Curry, MD;  Location: [place];  Service: General;;  . cardiolyte stress  04/2008  . CHOLECYSTECTOMY  2004  . COLONOSCOPY  06/2005  . ELBOW SURGERY Left 2001   "tendonitis"  . EXTERNAL EAR SURGERY  1971  . INCISIONAL HERNIA REPAIR N/A 05/12/2014   Procedure: OPEN REPAIR OF INCISIONAL HERNIA REPAIR;  Surgeon: Greer Pickerel, MD;  Location[facility];  Service: General;  Laterality: N/A;  . INGUINAL HERNIA REPAIR Left 05/31/2017   Procedure: LEFT INGUINAL HERNIA REPAIR  WITH MESH;  Surgeon: Greer Pickerel, MD;  Location: [place];  Service: General;  Laterality: Left;  . INSERTION OF MESH N/A 05/12/2014   Procedure: WITH INSERTION OF MESH;  Surgeon: Greer Pickerel, MD;  Location[facility];  Service: General;  Laterality: N/A;  . INSERTION OF MESH Left 05/31/2017   Procedure: INSERTION OF MESH;  Surgeon: Greer Pickerel, MD;  Location: [place];  Service: General;  Laterality: Left;  . KNEE ARTHROSCOPY Left 1990's  . LAPAROSCOPIC APPENDECTOMY N/A 04/07/2013   Procedure: ATTEMPTED APPENDECTOMY LAPAROSCOPIC;  Surgeon: Gayland Curry, MD;  Location: [place];  Service: General;  Laterality: N/A;  . LUNG SURGERY     "repaired ruptured bleb" '95  . MIDDLE EAR SURGERY Left 1972    exploratory tympanotomy/notes 07/17/2000  . NASAL SEPTUM SURGERY    . PARTIAL COLECTOMY  04/07/2013   Procedure: PARTIAL COLECTOMY, ILEOCECTOMY;  Surgeon: Gayland Curry, MD;  Location: [place];  Service: General;;  . SKIN CANCER EXCISION  2000's   "chest"  . TENDON REPAIR Left 1990's   "thumb"    Family History  Problem Relation Age of Onset  . Healthy Sister   . Lung disease Sister        has blebs on MRI,   . Breast cancer Maternal Aunt        diagnosed in her 63s  . Melanoma Maternal Uncle   . Breast cancer Maternal Aunt        diagnosed in her 35s  . Breast cancer Maternal Aunt        diagnosed in her 92s  . Breast cancer Mother   . Diabetes Mother   . Cancer Mother        breast  . Heart attack Father   . Diabetes Father   . Prostate cancer Father   . Cancer Father        prostate  . Healthy Brother   . Prostate cancer Brother   . Cancer Brother        prostate cancer  . Healthy Brother   . Cancer Cousin        multiple myeloma  . Colon cancer Neg Hx     Social History   Socioeconomic History  . Marital status: Married    Spouse name: Not on file  . Number of children: 4  . Years of education: Not on file  . Highest education level: Not on file  Occupational History  . 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 Smoking status: Former Smoker    Packs/day: 2.00    Years: 30.00    Pack years: 60.00    Types: Cigarettes    Quit date: 07/30/2012    Years since quitting: 6.5  . Smokeless tobacco: Never Used  Substance and Sexual Activity  . Alcohol use: No    Alcohol/week: 0.0 standard drinks  . Drug use: No  . Sexual activity: Not Currently  Other Topics Concern  . Not on file  Social History Narrative   Married--1 daughter and 3 step daughters   Social Determinants of Health   Financial Resource Strain:   . Difficulty of Paying Living Expenses: Not on file  Food Insecurity:   . Worried About Charity fundraiser  in the Last Year: Not on file  . Ran Out of Food in the Last Year: Not on file  Transportation Needs:   . Lack of Transportation (Medical): Not  on file  . Lack of Transportation (Non-Medical): Not on file  Physical Activity:   . Days of Exercise per Week: Not on file  . Minutes of Exercise per Session: Not on file  Stress:   . Feeling of Stress : Not on file  Social Connections:   . Frequency of Communication with Friends and Family: Not on file  . Frequency of Social Gatherings with Friends and Family: Not on file  . Attends Religious Services: Not on file  . Active Member of Clubs or Organizations: Not on file  . Attends Archivist Meetings: Not on file  . Marital Status: Not on file  Intimate Partner Violence:   . Fear of Current or Ex-Partner: Not on file  . Emotionally Abused: Not on file  . Physically Abused: Not on file  . Sexually Abused: Not on file   Review of Systems  Constitutional: Negative for fatigue and unexpected weight change.       Wears seat belt  HENT: Positive for hearing loss. Negative for tinnitus.        Deaf in left ear Keeps up with dentist--3 cavities filled in last year  Eyes:       Some increased trouble with reading Discussed formal exam  Respiratory: Negative for cough, chest tightness and shortness of breath.   Cardiovascular: Negative for chest pain.  Gastrointestinal: Negative for abdominal pain, blood in stool and constipation.       Rare heartburn  Endocrine: Negative for polydipsia and polyuria.  Genitourinary:       Some dribbling No sexual problems  Musculoskeletal: Positive for arthralgias and myalgias.       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 Negative for immunocompromised state.       Uses zyrtec daily---rare flonase  Neurological: Negative for dizziness, syncope, light-headedness and  headaches.  Hematological: Negative for adenopathy. Does not bruise/bleed easily.  Psychiatric/Behavioral: Negative for dysphoric mood and sleep disturbance. The patient is not nervous/anxious.        Objective:   Physical Exam  Constitutional: He is oriented to person, place, and time. He appears well-developed. No distress.  HENT:  Head: Normocephalic and atraumatic.  Right Ear: External ear normal.  Left Ear: External ear normal.  Mouth/Throat: Oropharynx is clear and moist. No oropharyngeal exudate.  Eyes: Pupils are equal, round, and reactive to light. Conjunctivae are normal.  Neck: No thyromegaly present.  Cardiovascular: Normal rate, regular rhythm, normal heart sounds and intact distal pulses. Exam reveals no gallop.  No murmur heard. Respiratory: Effort normal and breath sounds normal. No respiratory distress. He has no wheezes. He has no rales.  GI: Soft. There is no abdominal tenderness.  No inguinal hernia  Musculoskeletal:        General: No tenderness or edema.  Lymphadenopathy:    He has no cervical adenopathy.  Neurological: He is alert and oriented to person, place, and time.  Skin: No erythema.  Psychiatric: He has a normal mood and affect. His behavior is normal.           Assessment & Plan:

## 2019-02-07 NOTE — Assessment & Plan Note (Signed)
Healthy Discussed fitness though Flu vaccine today Colon due 2027 Will check PSA--he would like to do it yearly

## 2019-02-07 NOTE — Assessment & Plan Note (Signed)
BP Readings from Last 3 Encounters:  02/07/19 134/84  07/31/18 130/88  03/22/18 138/86   Good control

## 2019-05-05 ENCOUNTER — Encounter: Payer: Self-pay | Admitting: Internal Medicine

## 2019-05-05 ENCOUNTER — Other Ambulatory Visit: Payer: Self-pay | Admitting: Internal Medicine

## 2019-05-05 ENCOUNTER — Ambulatory Visit: Payer: 59 | Admitting: Internal Medicine

## 2019-05-05 ENCOUNTER — Other Ambulatory Visit: Payer: Self-pay

## 2019-05-05 DIAGNOSIS — R5383 Other fatigue: Secondary | ICD-10-CM

## 2019-05-05 DIAGNOSIS — R400 Somnolence: Secondary | ICD-10-CM

## 2019-05-05 LAB — COMPREHENSIVE METABOLIC PANEL
ALT: 36 U/L (ref 0–53)
AST: 21 U/L (ref 0–37)
Albumin: 4.5 g/dL (ref 3.5–5.2)
Alkaline Phosphatase: 63 U/L (ref 39–117)
BUN: 16 mg/dL (ref 6–23)
CO2: 33 mEq/L — ABNORMAL HIGH (ref 19–32)
Calcium: 9.8 mg/dL (ref 8.4–10.5)
Chloride: 101 mEq/L (ref 96–112)
Creatinine, Ser: 0.98 mg/dL (ref 0.40–1.50)
GFR: 76.82 mL/min (ref >60.00)
Glucose, Bld: 74 mg/dL (ref 70–99)
Potassium: 4.4 mEq/L (ref 3.5–5.1)
Sodium: 141 mEq/L (ref 135–145)
Total Bilirubin: 0.5 mg/dL (ref 0.2–1.2)
Total Protein: 7.3 g/dL (ref 6.0–8.3)

## 2019-05-05 LAB — VITAMIN B12: Vitamin B-12: 265 pg/mL (ref 211–911)

## 2019-05-05 LAB — VITAMIN D 25 HYDROXY (VIT D DEFICIENCY, FRACTURES): VITD: 90.59 ng/mL (ref 30.00–100.00)

## 2019-05-05 LAB — CBC
HCT: 47.4 % (ref 39.0–52.0)
Hemoglobin: 16.1 g/dL (ref 13.0–17.0)
MCHC: 34 g/dL (ref 30.0–36.0)
MCV: 89 fl (ref 78.0–100.0)
Platelets: 242 10*3/uL (ref 150.0–400.0)
RBC: 5.33 Mil/uL (ref 4.22–5.81)
RDW: 13.8 % (ref 11.5–15.5)
WBC: 6.8 10*3/uL (ref 4.0–10.5)

## 2019-05-05 LAB — SEDIMENTATION RATE: Sed Rate: 3 mm/hr (ref 0–20)

## 2019-05-05 LAB — T4, FREE: Free T4: 0.68 ng/dL (ref 0.60–1.60)

## 2019-05-05 NOTE — Progress Notes (Signed)
pulm

## 2019-05-05 NOTE — Assessment & Plan Note (Signed)
Has both fatigue----cutting down on length of walks--as well as increased daytime somnolence No symptoms to suggest coronary ischemia Will check labs----thyroid, etc No clear snoring or apnea---but need to consider sleep issues No emotional or affective explanations for this Never a napper---and now will sleep in afternoon "till someone wakes me up" if he sits down  If the labs are normal, will set up with pulmonary/sleep for evaluation [facility])

## 2019-05-05 NOTE — Progress Notes (Signed)
Subjective:    Patient ID: Ernest Hudson, male    DOB: 13-Oct-1955, 64 y.o.   MRN: [id number]  HPI Here due to fatigue This visit occurred during the SARS-CoV-2 public health emergency.  Safety protocols were in place, including screening questions prior to the visit, additional usage of staff PPE, and extensive cleaning of exam room while observing appropriate contact time as indicated for disinfecting solutions.   "I am getting wore out and exhausted by 1PM" Not working as much--but still gets tired Has employee to do most of the heavy lifting, etc  Gets up 5:30AM---starts work by [employer] in for lunch at noon--then gets real tired after Never a napper Stamina is down---has to cut walks from 3 miles to 2 miles No chest pain or SOB  No stress or life changes  Splits pot of caffeine coffee with wife in AM No other caffeine  Current Outpatient Medications on File Prior to Visit  Medication Sig Dispense Refill  . amLODipine (NORVASC) 10 MG tablet TAKE 1 TABLET BY MOUTH ONCE DAILY 90 tablet 3  . cetirizine (ZYRTEC) 10 MG tablet Take 10 mg by mouth every morning.     . fluticasone (FLONASE) 50 MCG/ACT nasal spray Place 2 sprays into both nostrils daily as needed. For allergies. 16 g 6  . ibuprofen (ADVIL,MOTRIN) 200 MG tablet Take 3 tablets (600 mg total) by mouth every 6 (six) hours as needed. You can take 2-3 of these every 6 hours as needed. 20 tablet 0  . lisinopril-hydrochlorothiazide (ZESTORETIC) 20-25 MG tablet TAKE 1 TABLET BY MOUTH DAILY. 90 tablet 3  . MAGNESIUM PO Take by mouth.    . Multiple Vitamin (MULTIVITAMIN) tablet Take 1 tablet by mouth daily. Multiple different supplements    . Multiple Vitamins-Minerals (ZINC PO) Take by mouth.    . Omega-3 Fatty Acids (OMEGA 3 PO) Take 1 tablet by mouth daily.     Marland Kitchen POTASSIUM PO Take by mouth.    . triamcinolone cream (KENALOG) 0.1 % Apply 1 application topically 2 (two) times daily. For no more than 14 days in a row. 45 g 1  .  VITAMIN D PO Take by mouth.     No current facility-administered medications on file prior to visit.    Allergies  Allergen Reactions  . Lipitor [Atorvastatin]   . Niacin     REACTION: rash    Past Medical History:  Diagnosis Date  . 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 Allergic rhinitis   . Arthritis    "probably in my fingers" (04/08/2013)  . Basal cell carcinoma 2000's   'chest"  . Birt-Hogg-Dube syndrome   . Bradycardia   . Dyslipidemia (high LDL; low HDL)   . 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 History of blood transfusion ?1995  . Hx of colonic polyps   . Hyperlipidemia   . Hypertension   . Ileus, postoperative ([place]) 04/17/2013  . Spontaneous pneumothorax 1995    Past Surgical History:  Procedure Laterality Date  . APPENDECTOMY  04/07/2013   Procedure: OPEN APPENDECTOMY;  Surgeon: Gayland Curry, MD;  Location: [place];  Service: General;;  . cardiolyte stress  04/2008  . CHOLECYSTECTOMY  2004  . COLONOSCOPY  06/2005  . ELBOW SURGERY Left 2001   "tendonitis"  . EXTERNAL EAR SURGERY  1971  . INCISIONAL HERNIA REPAIR N/A 05/12/2014   Procedure: OPEN REPAIR OF INCISIONAL HERNIA REPAIR;  Surgeon: Greer Pickerel, MD;  [facility];  Service: General;  Laterality: N/A;  . INGUINAL HERNIA REPAIR Left 05/31/2017   Procedure: LEFT INGUINAL HERNIA REPAIR WITH MESH;  Surgeon: Greer Pickerel, MD;  Location: [place];  Service: General;  Laterality: Left;  . INSERTION OF MESH N/A 05/12/2014   Procedure: WITH INSERTION OF MESH;  Surgeon: Greer Pickerel, MD;  [facility];  Service: General;  Laterality: N/A;  . INSERTION OF MESH Left 05/31/2017   Procedure: INSERTION OF MESH;  Surgeon: Greer Pickerel, MD;  Location: [place];  Service: General;  Laterality: Left;  . KNEE ARTHROSCOPY Left 1990's  . LAPAROSCOPIC APPENDECTOMY N/A 04/07/2013   Procedure: ATTEMPTED APPENDECTOMY LAPAROSCOPIC;  Surgeon: Gayland Curry, MD;   Location: [place];  Service: General;  Laterality: N/A;  . LUNG SURGERY     "repaired ruptured bleb" '95  . MIDDLE EAR SURGERY Left 1972   exploratory tympanotomy/notes 07/17/2000  . NASAL SEPTUM SURGERY    . PARTIAL COLECTOMY  04/07/2013   Procedure: PARTIAL COLECTOMY, ILEOCECTOMY;  Surgeon: Gayland Curry, MD;  Location: [place];  Service: General;;  . SKIN CANCER EXCISION  2000's   "chest"  . TENDON REPAIR Left 1990's   "thumb"    Family History  Problem Relation Age of Onset  . Healthy Sister   . Lung disease Sister        has blebs on MRI,   . Breast cancer Maternal Aunt        diagnosed in her 34s  . Melanoma Maternal Uncle   . Breast cancer Maternal Aunt        diagnosed in her 69s  . Breast cancer Maternal Aunt        diagnosed in her 28s  . Breast cancer Mother   . Diabetes Mother   . Cancer Mother        breast  . Heart attack Father   . Diabetes Father   . Prostate cancer Father   . Cancer Father        prostate  . Healthy Brother   . Prostate cancer Brother   . Cancer Brother        prostate cancer  . Healthy Brother   . Cancer Cousin        multiple myeloma  . Colon cancer Neg Hx     Social History   Socioeconomic History  . Marital status: Married    Spouse name: Not on file  . Number of children: 4  . Years of education: Not on file  . Highest education level: Not on file  Occupational History  . 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 Smoking status: Former Smoker    Packs/day: 2.00    Years: 30.00    Pack years: 60.00    Types: Cigarettes    Quit date: 07/30/2012    Years since quitting: 6.7  . Smokeless tobacco: Never Used  Substance and Sexual Activity  . Alcohol use: No    Alcohol/week: 0.0 standard drinks  . Drug use: No  . Sexual activity: Not Currently  Other Topics Concern  . Not on file  Social History Narrative   Married--1 daughter and 3 step daughters   Social Determinants of Health    Financial Resource Strain:   . Difficulty of Paying Living Expenses:   Food Insecurity:   . Worried About Charity fundraiser in the Last Year:   .  Ran Out of Food in the Last Year:   Transportation Needs:   . Film/video editor (Medical):   Marland Kitchen Lack of Transportation (Non-Medical):   Physical Activity:   . Days of Exercise per Week:   . Minutes of Exercise per Session:   Stress:   . Feeling of Stress :   Social Connections:   . Frequency of Communication with Friends and Family:   . Frequency of Social Gatherings with Friends and Family:   . Attends Religious Services:   . Active Member of Clubs or Organizations:   . Attends Archivist Meetings:   Marland Kitchen Marital Status:   Intimate Partner Violence:   . Fear of Current or Ex-Partner:   . Emotionally Abused:   Marland Kitchen Physically Abused:   . Sexually Abused:    Review of Systems Nocturia x 1 usually. Wakes up otherwise and just rolls over Wife doesn't note snoring or apnea No cough or SOB No abdominal pain or bowel problems Occasional burning in right groin No new supplements, etc    Objective:   Physical Exam  Constitutional: He appears well-developed. No distress.  HENT:  No oral lesions  Neck: No thyromegaly present.  Cardiovascular: Normal rate, regular rhythm, normal heart sounds and intact distal pulses. Exam reveals no gallop.  No murmur heard. Respiratory: Effort normal and breath sounds normal. No respiratory distress. He has no wheezes. He has no rales.  GI: Soft. There is no abdominal tenderness.  Musculoskeletal:        General: No tenderness or edema.     Cervical back: Normal range of motion.  Lymphadenopathy:    He has no cervical adenopathy.    He has no axillary adenopathy.       Right: No inguinal adenopathy present.       Left: No inguinal adenopathy present.  Skin: No rash noted. No erythema.  Psychiatric: He has a normal mood and affect. His behavior is normal.           Assessment &  Plan:

## 2019-05-05 NOTE — Patient Instructions (Signed)
We will plan a referral to a sleep specialist if all your labs are normal.

## 2019-11-28 ENCOUNTER — Other Ambulatory Visit: Payer: Self-pay | Admitting: Internal Medicine

## 2020-01-07 DIAGNOSIS — Z86018 Personal history of other benign neoplasm: Secondary | ICD-10-CM | POA: Diagnosis not present

## 2020-01-07 DIAGNOSIS — D225 Melanocytic nevi of trunk: Secondary | ICD-10-CM | POA: Diagnosis not present

## 2020-01-07 DIAGNOSIS — L821 Other seborrheic keratosis: Secondary | ICD-10-CM | POA: Diagnosis not present

## 2020-01-07 DIAGNOSIS — L409 Psoriasis, unspecified: Secondary | ICD-10-CM | POA: Diagnosis not present

## 2020-01-07 DIAGNOSIS — L814 Other melanin hyperpigmentation: Secondary | ICD-10-CM | POA: Diagnosis not present

## 2020-01-07 DIAGNOSIS — L570 Actinic keratosis: Secondary | ICD-10-CM | POA: Diagnosis not present

## 2020-01-07 DIAGNOSIS — L820 Inflamed seborrheic keratosis: Secondary | ICD-10-CM | POA: Diagnosis not present

## 2020-01-07 DIAGNOSIS — Z85828 Personal history of other malignant neoplasm of skin: Secondary | ICD-10-CM | POA: Diagnosis not present

## 2020-02-10 ENCOUNTER — Encounter: Payer: Self-pay | Admitting: Internal Medicine

## 2020-02-10 ENCOUNTER — Ambulatory Visit (INDEPENDENT_AMBULATORY_CARE_PROVIDER_SITE_OTHER): Payer: 59 | Admitting: Internal Medicine

## 2020-02-10 ENCOUNTER — Other Ambulatory Visit: Payer: Self-pay

## 2020-02-10 VITALS — BP 140/84 | HR 53 | Temp 97.9°F | Ht 70.0 in | Wt 198.0 lb

## 2020-02-10 DIAGNOSIS — Z23 Encounter for immunization: Secondary | ICD-10-CM

## 2020-02-10 DIAGNOSIS — Z125 Encounter for screening for malignant neoplasm of prostate: Secondary | ICD-10-CM

## 2020-02-10 DIAGNOSIS — Q8789 Other specified congenital malformation syndromes, not elsewhere classified: Secondary | ICD-10-CM

## 2020-02-10 DIAGNOSIS — Z0000 Encounter for general adult medical examination without abnormal findings: Secondary | ICD-10-CM

## 2020-02-10 DIAGNOSIS — I10 Essential (primary) hypertension: Secondary | ICD-10-CM

## 2020-02-10 LAB — CBC
HCT: 48.2 % (ref 39.0–52.0)
Hemoglobin: 16.1 g/dL (ref 13.0–17.0)
MCHC: 33.5 g/dL (ref 30.0–36.0)
MCV: 89.5 fl (ref 78.0–100.0)
Platelets: 223 10*3/uL (ref 150.0–400.0)
RBC: 5.39 Mil/uL (ref 4.22–5.81)
RDW: 13.3 % (ref 11.5–15.5)
WBC: 7 10*3/uL (ref 4.0–10.5)

## 2020-02-10 LAB — COMPREHENSIVE METABOLIC PANEL
ALT: 51 U/L (ref 0–53)
AST: 26 U/L (ref 0–37)
Albumin: 4.7 g/dL (ref 3.5–5.2)
Alkaline Phosphatase: 75 U/L (ref 39–117)
BUN: 19 mg/dL (ref 6–23)
CO2: 35 mEq/L — ABNORMAL HIGH (ref 19–32)
Calcium: 10.2 mg/dL (ref 8.4–10.5)
Chloride: 102 mEq/L (ref 96–112)
Creatinine, Ser: 0.98 mg/dL (ref 0.40–1.50)
GFR: 80.9 mL/min (ref >60.00)
Glucose, Bld: 77 mg/dL (ref 70–99)
Potassium: 4.2 mEq/L (ref 3.5–5.1)
Sodium: 141 mEq/L (ref 135–145)
Total Bilirubin: 0.5 mg/dL (ref 0.2–1.2)
Total Protein: 7.3 g/dL (ref 6.0–8.3)

## 2020-02-10 LAB — PSA: PSA: 4.9 ng/mL — ABNORMAL HIGH (ref 0.10–4.00)

## 2020-02-10 NOTE — Assessment & Plan Note (Signed)
BP Readings from Last 3 Encounters:  02/10/20 140/84  05/05/19 136/82  02/07/19 134/84   Reasonable control on lisinopril/HCTZ and amlodipine

## 2020-02-10 NOTE — Assessment & Plan Note (Signed)
Will check renal ultrasound again. CT better test but want to avoid repeated radiation exposure

## 2020-02-10 NOTE — Progress Notes (Signed)
Subjective:    Patient ID: Ernest Hudson, male    DOB: 1955-12-19, 64 y.o.   MRN: [id number]  HPI Here for physical This visit occurred during the SARS-CoV-2 public health emergency.  Safety protocols were in place, including screening questions prior to the visit, additional usage of staff PPE, and extensive cleaning of exam room while observing appropriate contact time as indicated for disinfecting solutions.   His fatigue went away about a week after my visit No recurrence Continues to run his busy--- 60-70 hours per week (got rid of helper) Plans to cut back some Does walk some--but doesn't find time often  Still with right inguinal hernia No pain and stable Wears hernia girdle when working Rarely will ache---ibuprofen will resolve Goes in and out on its own  Current Outpatient Medications on File Prior to Visit  Medication Sig Dispense Refill  . amLODipine (NORVASC) 10 MG tablet TAKE 1 TABLET BY MOUTH ONCE DAILY 90 tablet 3  . cetirizine (ZYRTEC) 10 MG tablet Take 10 mg by mouth every morning.    . fluticasone (FLONASE) 50 MCG/ACT nasal spray Place 2 sprays into both nostrils daily as needed. For allergies. 16 g 6  . ibuprofen (ADVIL,MOTRIN) 200 MG tablet Take 3 tablets (600 mg total) by mouth every 6 (six) hours as needed. You can take 2-3 of these every 6 hours as needed. 20 tablet 0  . lisinopril-hydrochlorothiazide (ZESTORETIC) 20-25 MG tablet TAKE 1 TABLET BY MOUTH DAILY. 90 tablet 3  . MAGNESIUM PO Take by mouth.    . Multiple Vitamin (MULTIVITAMIN) tablet Take 1 tablet by mouth daily. Multiple different supplements    . Multiple Vitamins-Minerals (ZINC PO) Take by mouth.    . Omega-3 Fatty Acids (OMEGA 3 PO) Take 1 tablet by mouth daily.     Marland Kitchen POTASSIUM PO Take by mouth.    . triamcinolone cream (KENALOG) 0.1 % Apply 1 application topically 2 (two) times daily. For no more than 14 days in a row. 45 g 1  . VITAMIN D PO Take by mouth.     No current  facility-administered medications on file prior to visit.    Allergies  Allergen Reactions  . Lipitor [Atorvastatin]   . Niacin     REACTION: rash    Past Medical History:  Diagnosis Date  . 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 Allergic rhinitis   . Arthritis    "probably in my fingers" (04/08/2013)  . Basal cell carcinoma 2000's   'chest"  . Birt-Hogg-Dube syndrome   . Bradycardia   . Dyslipidemia (high LDL; low HDL)   . 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 History of blood transfusion ?1995  . Hx of colonic polyps   . Hyperlipidemia   . Hypertension   . Ileus, postoperative ([place]) 04/17/2013  . Spontaneous pneumothorax 1995    Past Surgical History:  Procedure Laterality Date  . APPENDECTOMY  04/07/2013   Procedure: OPEN APPENDECTOMY;  Surgeon: Gayland Curry, MD;  Location: [facility];  Service: General;;  . cardiolyte stress  04/2008  . CHOLECYSTECTOMY  2004  . COLONOSCOPY  06/2005  . ELBOW SURGERY Left 2001   "tendonitis"  . EXTERNAL EAR SURGERY  1971  . INCISIONAL HERNIA REPAIR N/A 05/12/2014   Procedure: OPEN REPAIR OF INCISIONAL HERNIA REPAIR;  Surgeon: Greer Pickerel, MD;  Location: [facility];  Service: General;  Laterality: N/A;  . INGUINAL HERNIA REPAIR Left  05/31/2017   Procedure: LEFT INGUINAL HERNIA REPAIR WITH MESH;  Surgeon: Greer Pickerel, MD;  Location: [facility];  Service: General;  Laterality: Left;  . INSERTION OF MESH N/A 05/12/2014   Procedure: WITH INSERTION OF MESH;  Surgeon: Greer Pickerel, MD;  Location: [facility];  Service: General;  Laterality: N/A;  . INSERTION OF MESH Left 05/31/2017   Procedure: INSERTION OF MESH;  Surgeon: Greer Pickerel, MD;  Location: [facility];  Service: General;  Laterality: Left;  . KNEE ARTHROSCOPY Left 1990's  . LAPAROSCOPIC APPENDECTOMY N/A 04/07/2013   Procedure: ATTEMPTED APPENDECTOMY LAPAROSCOPIC;  Surgeon: Gayland Curry, MD;  Location: [facility];  Service: General;  Laterality:  N/A;  . LUNG SURGERY     "repaired ruptured bleb" '95  . MIDDLE EAR SURGERY Left 1972   exploratory tympanotomy/notes 07/17/2000  . NASAL SEPTUM SURGERY    . PARTIAL COLECTOMY  04/07/2013   Procedure: PARTIAL COLECTOMY, ILEOCECTOMY;  Surgeon: Gayland Curry, MD;  Location: [facility];  Service: General;;  . SKIN CANCER EXCISION  2000's   "chest"  . TENDON REPAIR Left 1990's   "thumb"    Family History  Problem Relation Age of Onset  . Healthy Sister   . Lung disease Sister        has blebs on MRI,   . Breast cancer Maternal Aunt        diagnosed in her 88s  . Melanoma Maternal Uncle   . Breast cancer Maternal Aunt        diagnosed in her 77s  . Breast cancer Maternal Aunt        diagnosed in her 53s  . Breast cancer Mother   . Diabetes Mother   . Cancer Mother        breast  . Heart attack Father   . Diabetes Father   . Prostate cancer Father   . Cancer Father        prostate  . Healthy Brother   . Prostate cancer Brother   . Cancer Brother        prostate cancer  . Healthy Brother   . Cancer Cousin        multiple myeloma  . Colon cancer Neg Hx     Social History   Socioeconomic History  . Marital status: Married    Spouse name: Not on file  . Number of children: 4  . Years of education: Not on file  . Highest education level: Not on file  Occupational History  . 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 Smoking status: Former Smoker    Packs/day: 2.00    Years: 30.00    Pack years: 60.00    Types: Cigarettes    Quit date: 07/30/2012    Years since quitting: 7.5  . Smokeless tobacco: Never Used  Substance and Sexual Activity  . Alcohol use: No    Alcohol/week: 0.0 standard drinks  . Drug use: No  . Sexual activity: Not Currently  Other Topics Concern  . Not on file  Social History Narrative   Married--1 daughter and 3 step daughters   Social Determinants of Health   Financial Resource Strain: Not on file  Food  Insecurity: Not on file  Transportation Needs: Not on file  Physical Activity: Not on file  Stress: Not on file  Social Connections: Not on file  Intimate Partner Violence: Not on file   Review of Systems  Constitutional:  Negative for fatigue and unexpected weight change.       Wears seat belt  HENT: Negative for tinnitus.        Deaf in left ear---right is stuffed at times More cavities lately--sees dentist  Eyes:       No diplopia or unilateral vision loss---some decline in vision recently Has appt coming up  Respiratory: Negative for cough, chest tightness and shortness of breath.   Cardiovascular: Negative for palpitations and leg swelling.       About 4 weeks ago--had right chest pain. Worried about another pneumothorax--but then it went away  Gastrointestinal: Negative for blood in stool and constipation.       No heartburn  Endocrine: Negative for polydipsia and polyuria.  Genitourinary: Positive for frequency. Negative for difficulty urinating.       Nocturia x 1 No sexual problems  Musculoskeletal: Negative for back pain.       Swollen DIP on hands--only occasional pain  Skin: Negative for rash.       Has "boil" on back--some pain  Allergic/Immunologic: Positive for environmental allergies. Negative for immunocompromised state.       Zyrtec controls Occasional flonase  Neurological: Negative for dizziness, syncope, light-headedness and headaches.  Hematological: Negative for adenopathy. Bruises/bleeds easily.  Psychiatric/Behavioral: Negative for dysphoric mood and sleep disturbance. The patient is not nervous/anxious.        Objective:   Physical Exam Constitutional:      Appearance: Normal appearance.  HENT:     Right Ear: Tympanic membrane, ear canal and external ear normal.     Left Ear: Tympanic membrane, ear canal and external ear normal.     Mouth/Throat:     Pharynx: No oropharyngeal exudate or posterior oropharyngeal erythema.  Eyes:      Conjunctiva/sclera: Conjunctivae normal.     Pupils: Pupils are equal, round, and reactive to light.  Cardiovascular:     Rate and Rhythm: Normal rate and regular rhythm.     Pulses: Normal pulses.     Heart sounds: No murmur heard. No gallop.   Pulmonary:     Effort: Pulmonary effort is normal.     Breath sounds: Normal breath sounds. No wheezing or rales.  Abdominal:     Palpations: Abdomen is soft.     Tenderness: There is no abdominal tenderness.  Musculoskeletal:     Cervical back: Neck supple.     Right lower leg: No edema.     Left lower leg: No edema.  Lymphadenopathy:     Cervical: No cervical adenopathy.  Skin:    General: Skin is warm.     Findings: No rash.  Neurological:     General: No focal deficit present.     Mental Status: He is alert and oriented to person, place, and time.  Psychiatric:        Mood and Affect: Mood normal.        Behavior: Behavior normal.            Assessment & Plan:

## 2020-02-10 NOTE — Assessment & Plan Note (Signed)
Healthy Discussed fitness Colon due 2027 Will do yearly PSA till 31 (at least)---his concern Urged him to get COVID vaccine Prevnar and flu vaccine today

## 2020-02-10 NOTE — Addendum Note (Signed)
Addended by: Pilar Grammes on: 02/10/2020 08:37 AM   Modules accepted: Orders

## 2020-02-11 ENCOUNTER — Telehealth: Payer: Self-pay

## 2020-02-11 NOTE — Telephone Encounter (Signed)
Left message for patient to call back to discuss Ultrasound appointment information.  Scheduled for 02/19/20 at 9:30 - arrive 9:15 am with full bladder. At [facility] Address: [address] [place], [place], [place] [address]  Phone: [phone]

## 2020-02-12 ENCOUNTER — Telehealth: Payer: Self-pay | Admitting: Internal Medicine

## 2020-02-12 DIAGNOSIS — R972 Elevated prostate specific antigen [PSA]: Secondary | ICD-10-CM

## 2020-02-12 NOTE — Telephone Encounter (Signed)
Pt called in wanted to go ahead and set up an appointment with the urologist. He is wanting to go [place] at [facility] urology

## 2020-02-12 NOTE — Telephone Encounter (Signed)
Spoke with patient and he is aware of his appointment °

## 2020-02-12 NOTE — Telephone Encounter (Signed)
Will need referral order. Thank you

## 2020-02-19 ENCOUNTER — Ambulatory Visit
Admission: RE | Admit: 2020-02-19 | Discharge: 2020-02-19 | Disposition: A | Discharge status: Home / Self Care | Payer: 59 | Source: Ambulatory Visit | Attending: Internal Medicine | Admitting: Internal Medicine

## 2020-02-19 ENCOUNTER — Other Ambulatory Visit: Payer: Self-pay

## 2020-02-19 DIAGNOSIS — Q8789 Other specified congenital malformation syndromes, not elsewhere classified: Secondary | ICD-10-CM | POA: Diagnosis not present

## 2020-02-19 DIAGNOSIS — N2889 Other specified disorders of kidney and ureter: Secondary | ICD-10-CM | POA: Diagnosis not present

## 2020-02-26 ENCOUNTER — Telehealth: Payer: Self-pay

## 2020-02-26 NOTE — Telephone Encounter (Signed)
Received voicemail from patient asking for covid testing. I have called patient and informed that to have tested here he would need to have virtual. At this time we do not have anything open at this time. Pt has a test scheduled for Saturday. I have given information for [facility] if he would like to have sooner. He describes his symptoms as very mild. Did review red words with patient that he would need to seek care and had repeated back to me.

## 2020-02-28 ENCOUNTER — Other Ambulatory Visit: Payer: Self-pay

## 2020-02-28 DIAGNOSIS — Z20822 Contact with and (suspected) exposure to covid-19: Secondary | ICD-10-CM | POA: Diagnosis not present

## 2020-03-02 LAB — NOVEL CORONAVIRUS, NAA: SARS-CoV-2, NAA: DETECTED — AB

## 2020-03-24 ENCOUNTER — Other Ambulatory Visit: Payer: Self-pay | Admitting: Urology

## 2020-05-14 ENCOUNTER — Other Ambulatory Visit: Payer: Self-pay | Admitting: Urology

## 2020-05-22 ENCOUNTER — Other Ambulatory Visit: Payer: Self-pay

## 2020-05-22 MED FILL — Levofloxacin Tab 750 MG: ORAL | 1 days supply | Qty: 1 | Fill #0 | Status: CN

## 2020-06-01 ENCOUNTER — Other Ambulatory Visit: Payer: Self-pay

## 2020-06-06 MED FILL — Amlodipine Besylate Tab 10 MG (Base Equivalent): ORAL | 90 days supply | Qty: 90 | Fill #0 | Status: AC

## 2020-06-06 MED FILL — Lisinopril & Hydrochlorothiazide Tab 20-25 MG: ORAL | 90 days supply | Qty: 90 | Fill #0 | Status: AC

## 2020-06-07 ENCOUNTER — Other Ambulatory Visit: Payer: Self-pay

## 2020-06-07 DIAGNOSIS — M47816 Spondylosis without myelopathy or radiculopathy, lumbar region: Secondary | ICD-10-CM | POA: Diagnosis not present

## 2020-06-07 DIAGNOSIS — C61 Malignant neoplasm of prostate: Secondary | ICD-10-CM | POA: Diagnosis not present

## 2020-06-07 DIAGNOSIS — E278 Other specified disorders of adrenal gland: Secondary | ICD-10-CM | POA: Diagnosis not present

## 2020-06-07 DIAGNOSIS — K4090 Unilateral inguinal hernia, without obstruction or gangrene, not specified as recurrent: Secondary | ICD-10-CM | POA: Diagnosis not present

## 2020-06-08 ENCOUNTER — Other Ambulatory Visit: Payer: Self-pay

## 2020-06-09 ENCOUNTER — Other Ambulatory Visit: Payer: Self-pay

## 2020-06-24 ENCOUNTER — Other Ambulatory Visit: Payer: Self-pay | Admitting: Urology

## 2020-07-14 NOTE — Patient Instructions (Addendum)
DUE TO COVID-19 ONLY ONE VISITOR IS ALLOWED TO COME WITH YOU AND STAY IN THE WAITING ROOM ONLY DURING PRE OP AND PROCEDURE DAY OF SURGERY. THE 1 VISITOR  MAY VISIT WITH YOU AFTER SURGERY IN YOUR PRIVATE ROOM DURING VISITING HOURS ONLY!  YOU NEED TO HAVE A COVID 19 TEST ON: 08/02/20 @ 9:00 AM , THIS TEST MUST BE DONE BEFORE SURGERY,  COVID TESTING SITE [place] [address], IT IS ON THE RIGHT GOING OUT [address] APPROXIMATELY  2 MINUTES PAST [employer] ON THE RIGHT. ONCE YOUR COVID TEST IS COMPLETED,  PLEASE BEGIN THE QUARANTINE INSTRUCTIONS AS OUTLINED IN YOUR HANDOUT.                Ernest Hudson    Your procedure is scheduled on: 08/05/20   Report to [facility] Main  Entrance   Report to admitting at: 9:30 AM     Call this number if you have problems the morning of surgery [phone]    Remember: Do not eat solid food :After Midnight. Clear liquids until: 8:30 AM    CLEAR LIQUID DIET   Foods Allowed                                                                     Foods Excluded  Coffee and tea, regular and decaf                             liquids that you cannot  Plain Jell-O any favor except red or purple                                           see through such as: Fruit ices (not with fruit pulp)                                     milk, soups, orange juice  Iced Popsicles                                    All solid food Carbonated beverages, regular and diet                                    Cranberry, grape and apple juices Sports drinks like Gatorade Lightly seasoned clear broth or consume(fat free) Sugar, honey syrup  Sample Menu Breakfast                                Lunch                                     Supper Cranberry juice  Beef broth                            Chicken broth Jell-O                                     Grape juice                           Apple juice Coffee or tea                         Jell-O                                      Popsicle                                                Coffee or tea                        Coffee or tea  _____________________________________________________________________   BRUSH YOUR TEETH MORNING OF SURGERY AND RINSE YOUR MOUTH OUT, NO CHEWING GUM CANDY OR MINTS.     Take these medicines the morning of surgery with A SIP OF WATER: cetirizine,amlodipine.Flonase as usual.                                You may not have any metal on your body including hair pins and              piercings  Do not wear jewelry, lotions, powders or perfumes, deodorant             Men may shave face and neck.   Do not bring valuables to the hospital. [place].  Contacts, dentures or bridgework may not be worn into surgery.  Leave suitcase in the car. After surgery it may be brought to your room.     Patients discharged the day of surgery will not be allowed to drive home. IF YOU ARE HAVING SURGERY AND GOING HOME THE SAME DAY, YOU MUST HAVE AN ADULT TO DRIVE YOU HOME AND BE WITH YOU FOR 24 HOURS. YOU MAY GO HOME BY TAXI OR UBER OR ORTHERWISE, BUT AN ADULT MUST ACCOMPANY YOU HOME AND STAY WITH YOU FOR 24 HOURS.  Name and phone number of your driver:  Special Instructions: N/A              Please read over the following fact sheets you were given: _____________________________________________________________________          [facility] - Preparing for Surgery Before surgery, you can play an important role.  Because skin is not sterile, your skin needs to be as free of germs as possible.  You can reduce the number of germs on your skin by washing with CHG (chlorahexidine gluconate) soap before surgery.  CHG is an antiseptic cleaner which kills germs and bonds with the skin  to continue killing germs even after washing. Please DO NOT use if you have an allergy to CHG or antibacterial soaps.   If your skin becomes reddened/irritated stop using the CHG and inform your nurse when you arrive at Short Stay. Do not shave (including legs and underarms) for at least 48 hours prior to the first CHG shower.  You may shave your face/neck. Please follow these instructions carefully:  1.  Shower with CHG Soap the night before surgery and the  morning of Surgery.  2.  If you choose to wash your hair, wash your hair first as usual with your  normal  shampoo.  3.  After you shampoo, rinse your hair and body thoroughly to remove the  shampoo.                           4.  Use CHG as you would any other liquid soap.  You can apply chg directly  to the skin and wash                       Gently with a scrungie or clean washcloth.  5.  Apply the CHG Soap to your body ONLY FROM THE NECK DOWN.   Do not use on face/ open                           Wound or open sores. Avoid contact with eyes, ears mouth and genitals (private parts).                       Wash face,  Genitals (private parts) with your normal soap.             6.  Wash thoroughly, paying special attention to the area where your surgery  will be performed.  7.  Thoroughly rinse your body with warm water from the neck down.  8.  DO NOT shower/wash with your normal soap after using and rinsing off  the CHG Soap.                9.  Pat yourself dry with a clean towel.            10.  Wear clean pajamas.            11.  Place clean sheets on your bed the night of your first shower and do not  sleep with pets. Day of Surgery : Do not apply any lotions/deodorants the morning of surgery.  Please wear clean clothes to the hospital/surgery center.  FAILURE TO FOLLOW THESE INSTRUCTIONS MAY RESULT IN THE CANCELLATION OF YOUR SURGERY PATIENT SIGNATURE_________________________________  NURSE SIGNATURE__________________________________  ________________________________________________________________________   Ernest Hudson  An incentive  spirometer is a tool that can help keep your lungs clear and active. This tool measures how well you are filling your lungs with each breath. Taking long deep breaths may help reverse or decrease the chance of developing breathing (pulmonary) problems (especially infection) following:  A long period of time when you are unable to move or be active. BEFORE THE PROCEDURE   If the spirometer includes an indicator to show your best effort, your nurse or respiratory therapist will set it to a desired goal.  If possible, sit up straight or lean slightly forward. Try not to slouch.  Hold the incentive spirometer in an upright position. INSTRUCTIONS FOR USE  1. Sit on the edge of your bed if possible, or sit up as far as you can in bed or on a chair. 2. Hold the incentive spirometer in an upright position. 3. Breathe out normally. 4. Place the mouthpiece in your mouth and seal your lips tightly around it. 5. Breathe in slowly and as deeply as possible, raising the piston or the ball toward the top of the column. 6. Hold your breath for 3-5 seconds or for as long as possible. Allow the piston or ball to fall to the bottom of the column. 7. Remove the mouthpiece from your mouth and breathe out normally. 8. Rest for a few seconds and repeat Steps 1 through 7 at least 10 times every 1-2 hours when you are awake. Take your time and take a few normal breaths between deep breaths. 9. The spirometer may include an indicator to show your best effort. Use the indicator as a goal to work toward during each repetition. 10. After each set of 10 deep breaths, practice coughing to be sure your lungs are clear. If you have an incision (the cut made at the time of surgery), support your incision when coughing by placing a pillow or rolled up towels firmly against it. Once you are able to get out of bed, walk around indoors and cough well. You may stop using the incentive spirometer when instructed by your caregiver.   RISKS AND COMPLICATIONS  Take your time so you do not get dizzy or light-headed.  If you are in pain, you may need to take or ask for pain medication before doing incentive spirometry. It is harder to take a deep breath if you are having pain. AFTER USE  Rest and breathe slowly and easily.  It can be helpful to keep track of a log of your progress. Your caregiver can provide you with a simple table to help with this. If you are using the spirometer at home, follow these instructions: [place] IF:   You are having difficultly using the spirometer.  You have trouble using the spirometer as often as instructed.  Your pain medication is not giving enough relief while using the spirometer.  You develop fever of 100.5 F (38.1 C) or higher. SEEK IMMEDIATE MEDICAL CARE IF:   You cough up bloody sputum that had not been present before.  You develop fever of 102 F (38.9 C) or greater.  You develop worsening pain at or near the incision site. MAKE SURE YOU:   Understand these instructions.  Will watch your condition.  Will get help right away if you are not doing well or get worse. Document Released: 06/19/2006 Document Revised: 05/01/2011 Document Reviewed: 08/20/2006 [facility] Patient Information 2014 [place], [place].   ________________________________________________________________________

## 2020-07-15 ENCOUNTER — Encounter (HOSPITAL_COMMUNITY)
Admission: RE | Admit: 2020-07-15 | Discharge: 2020-07-15 | Disposition: A | Discharge status: Home / Self Care | Payer: Medicare Other | Source: Ambulatory Visit | Attending: Urology | Admitting: Urology

## 2020-07-15 ENCOUNTER — Encounter (HOSPITAL_COMMUNITY): Payer: Self-pay

## 2020-07-15 ENCOUNTER — Other Ambulatory Visit: Payer: Self-pay

## 2020-07-15 NOTE — Progress Notes (Signed)
COVID Vaccine Completed: NO Date COVID Vaccine completed: COVID vaccine manufacturer: Pfizer    Moderna   [employer]   PCP - Dr. Viviana Simpler Cardiologist - No  Chest x-ray -  EKG -  Stress Test -  ECHO -  Cardiac Cath -  Pacemaker/ICD device last checked:  Sleep Study -  CPAP -   Fasting Blood Sugar -  Checks Blood Sugar _____ times a day  Blood Thinner Instructions: Aspirin Instructions: Last Dose:  Anesthesia review: Hx: HTN  Patient denies shortness of breath, fever, cough and chest pain at PAT appointment   Patient verbalized understanding of instructions that were given to them at the PAT appointment. Patient was also instructed that they will need to review over the PAT instructions again at home before surgery.

## 2020-07-29 ENCOUNTER — Encounter (HOSPITAL_COMMUNITY): Payer: Self-pay

## 2020-08-02 ENCOUNTER — Other Ambulatory Visit (HOSPITAL_COMMUNITY)
Admission: RE | Admit: 2020-08-02 | Discharge: 2020-08-02 | Disposition: A | Discharge status: Home / Self Care | Payer: Medicare Other | Source: Ambulatory Visit

## 2020-08-02 ENCOUNTER — Other Ambulatory Visit: Payer: Self-pay

## 2020-08-02 ENCOUNTER — Encounter (HOSPITAL_COMMUNITY)
Admission: RE | Admit: 2020-08-02 | Discharge: 2020-08-02 | Disposition: A | Discharge status: Home / Self Care | Payer: Medicare Other | Source: Ambulatory Visit | Attending: Urology | Admitting: Urology

## 2020-08-02 DIAGNOSIS — Z20822 Contact with and (suspected) exposure to covid-19: Secondary | ICD-10-CM | POA: Diagnosis not present

## 2020-08-02 DIAGNOSIS — Z01818 Encounter for other preprocedural examination: Secondary | ICD-10-CM | POA: Diagnosis not present

## 2020-08-02 LAB — CBC
HCT: 46.6 % (ref 39.0–52.0)
Hemoglobin: 16.3 g/dL (ref 13.0–17.0)
MCH: 30.9 pg (ref 26.0–34.0)
MCHC: 35 g/dL (ref 30.0–36.0)
MCV: 88.4 fL (ref 80.0–100.0)
Platelets: 223 10*3/uL (ref 150–400)
RBC: 5.27 MIL/uL (ref 4.22–5.81)
RDW: 13.4 % (ref 11.5–15.5)
WBC: 5.5 10*3/uL (ref 4.0–10.5)
nRBC: 0 % (ref 0.0–0.2)

## 2020-08-02 LAB — BASIC METABOLIC PANEL
Anion gap: 7 (ref 5–15)
BUN: 15 mg/dL (ref 8–23)
CO2: 29 mmol/L (ref 22–32)
Calcium: 9.7 mg/dL (ref 8.9–10.3)
Chloride: 105 mmol/L (ref 98–111)
Creatinine, Ser: 1.02 mg/dL (ref 0.61–1.24)
GFR, Estimated: >60 mL/min (ref >60)
Glucose, Bld: 96 mg/dL (ref 70–99)
Potassium: 4.1 mmol/L (ref 3.5–5.1)
Sodium: 141 mmol/L (ref 135–145)

## 2020-08-02 LAB — SARS CORONAVIRUS 2 (TAT 6-24 HRS): SARS Coronavirus 2: NEGATIVE

## 2020-08-04 NOTE — Anesthesia Preprocedure Evaluation (Addendum)
Anesthesia Evaluation  Patient identified by MRN, date of birth, ID band Patient awake    Reviewed: Allergy & Precautions, H&P , NPO status , Patient's Chart, lab work & pertinent test results  Airway Mallampati: III  TM Distance: >3 FB Neck ROM: Full    Dental no notable dental hx. (+) Teeth Intact, Dental Advisory Given   Pulmonary neg pulmonary ROS, former smoker,    Pulmonary exam normal breath sounds clear to auscultation       Cardiovascular Exercise Tolerance: Good hypertension, Pt. on medications  Rhythm:Regular Rate:Normal     Neuro/Psych negative neurological ROS  negative psych ROS   GI/Hepatic negative GI ROS, Neg liver ROS,   Endo/Other  negative endocrine ROS  Renal/GU negative Renal ROS  negative genitourinary   Musculoskeletal  (+) Arthritis , Osteoarthritis,    Abdominal   Peds  Hematology negative hematology ROS (+)   Anesthesia Other Findings   Reproductive/Obstetrics negative OB ROS                            Anesthesia Physical Anesthesia Plan  ASA: 2  Anesthesia Plan: General   Post-op Pain Management:    Induction: Intravenous  PONV Risk Score and Plan: 3 and Ondansetron, Dexamethasone and Midazolam  Airway Management Planned: Oral ETT  Additional Equipment: None  Intra-op Plan:   Post-operative Plan: Extubation in OR  Informed Consent: I have reviewed the patients History and Physical, chart, labs and discussed the procedure including the risks, benefits and alternatives for the proposed anesthesia with the patient or authorized representative who has indicated his/her understanding and acceptance.     Dental advisory given  Plan Discussed with: CRNA  Anesthesia Plan Comments: (GA w lidocaine infusion)      Anesthesia Quick Evaluation

## 2020-08-04 NOTE — H&P (Signed)
Office Visit Report     07/06/2020   --------------------------------------------------------------------------------   Ernest Hudson  MRN: [id number]  DOB: 1955/03/24, 65 year old Male  SSN: [id number]   PRIMARY CARE:  Venia Carbon, MD  REFERRING:  Lillette Boxer. Diona Fanti, MD  PROVIDER:  Raynelle Bring, M.D.  LOCATION:  [facility] [phone]     --------------------------------------------------------------------------------   CC/HPI: CC: Prostate Cancer   Physician requesting consult: Dr. Franchot Gallo  PCP: Dr. Viviana Simpler   Ernest Hudson is a 65 year old gentleman with an elevated PSA of 4.9. He has a strong family history of prostate cancer with his father, brother, and grandfather all with a history of prostate cancer (I have operated on his brother). He underwent a TRUS biopsy of the prostate on 05/14/20 that revealed Gleason 4+3=7 adenocarcinoma of the prostate with 10 out of 12 biopsy cores positive for malignancy.   Family history: Father, brother, grandfather.   Imaging studies:  CT abdomen/pelvis (06/07/20): No evidence of metastatic disease. 2.8 cm stable left adrenal nodule. 9 mm left hyperdense renal cyst.   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  PSH: Laparoscopic cholecystectomy. He underwent an open appendectomy for a ruptured appendicitis in 2015. He subsequently required an open ventral hernia repair in 2016. Finally, he underwent a left open inguinal hernia repair in 2018.   TNM stage: cT1c N0 Mx  PSA: 4.9  Gleason score: 4+3=7 (GG 3)  Biopsy (05/14/20): 10/12 cores positive  Left: L lateral apex (60%, 3+4=7, PNI), L apex (30%, 4+3=7), L lateral mid (50%, 3+4=7, PNI), L mid (40%, 3+4=7, PNI), L lateral base (40%, 3+4=7, PNI)  Right: R apex (20%, 4+3=7, PNI), R  lateral apex (5%, 3+4=7, PNI), R mid (30%, 3+4=7, PNI), R lateral mid (5%, 3+4=7, PNI), R lateral base (5%, 3+4=7, PNI)  Prostate volume: 20.4 cc   Nomogram  OC disease: 29%  EPE: 69%  SVI: 23%  LNI: 18%  PFS (5 year, 10 year): 58%, 42%   Urinary function: IPSS is 14.  Erectile function: SHIM score is 25.       ALLERGIES: Niacin TABS - Skin Rash Statin - Skin Rash    MEDICATIONS: Levofloxacin 750 mg tablet 1 tablet PO Morning of procedure  Levofloxacin 750 mg tablet 1 tablet PO Morning of procedure  Zyrtec 10 mg tablet  Amlodipine Besylate 10 mg tablet  Aspirin 81 MG TABS Oral  Cetirizine HCl TABS Oral  Coq10  Grape Seed  Lisinopril 40 MG Oral Tablet Oral  Lisinopril-Hydrochlorothiazide 20 mg-25 mg tablet  Magnesium 200 mg tablet  Megared  Vitamin D3  Zinc 50 mg tablet     GU PSH: Locm 300-399Mg /Ml Iodine,1Ml - 06/07/2020 Prostate Needle Biopsy - 05/14/2020 Vasectomy - 2012       PSH Notes: Hand Surgery, Surgery Of Male Genitalia Vasectomy, Knee Arthroscopy, Lung Surgery, Cholecystectomy Laparoscopic, Elbow Surgery, Ear Surgery   NON-GU PSH: Appendectomy (open) Cholecystectomy (laparoscopic) - 2012 Elbow Arthroscopy Incisional hernia repair (laparoscopic) Knee Arthroscopy, Left Left Hand, Thumb Lung Surgery (Unspecified) - 2012 Surgical Pathology, Gross And Microscopic Examination For Prostate Needle - 05/14/2020     GU PMH: Prostate Cancer - 06/28/2020, New diagnosis of prostate cancer, high-volume GG 3 disease. Negative CT scan. Kattan Nomogram predictions: Organ confined disease -29% Lymph nodes/seminal vesicle involvement -- 18/23%  Progression free probability after radical prostatectomy , - 06/16/2020, - 06/07/2020 Elevated PSA, Ernest Hudson has an elevating PSA trend and strong fhx of PCa. He requires further evaluation and will be scheduled for TRUSP/bx. - 03/24/2020 Adrenal mass Unspec, Adrenal cortical adenoma, unspecified laterality - 2014 History of urolithiasis,  Nephrolithiasis - 2014 Renal cyst, Renal cyst, acquired - 2014 Ureteral calculus, Calculus of ureter - 2014      PMH Notes: BHD (Birt-Hog Dube' Syndrome)    NON-GU PMH: Muscle weakness (generalized) - 06/28/2020 Other muscle spasm - 06/28/2020 Other specified disorders of muscle - 06/28/2020 Personal history of other diseases of the circulatory system, History of hypertension - 2014 Encounter for general adult medical examination without abnormal findings, Encounter for preventive health examination Other specified congenital malformation syndromes, not elsewhere classified, Birt-Hogg-Dub syndrome    FAMILY HISTORY: 1 Daughter - Daughter Acute Myocardial Infarction - Father Birt-Hogg-Dube Syndrome - Runs in Family, Sister Breast Cancer - Mother Death of family member - Father, Mother Dementia - Grandmother, Father, Mother Diabetes - Mother, Father Prostate Cancer - Brother, Merchant navy officer, Father Transient Ischemic Attack - Mother   SOCIAL HISTORY: Marital Status: Married Current Smoking Status: Patient does not smoke anymore. Has not smoked since 03/23/2005.   Tobacco Use Assessment Completed: Used Tobacco in last 30 days? Has never drank.  Drinks 3 caffeinated drinks per day.     Notes: Tobacco use, Marital History - Currently Married, Occupation:, Alcohol Use, Caffeine Use, Caffeine Use   REVIEW OF SYSTEMS:    GU Review Male:   Patient denies frequent urination, hard to postpone urination, burning/ pain with urination, get up at night to urinate, leakage of urine, stream starts and stops, trouble starting your streams, and have to strain to urinate .  Gastrointestinal (Lower):   Patient denies diarrhea and constipation.  Gastrointestinal (Upper):   Patient denies nausea and vomiting.  Constitutional:   Patient denies fever, night sweats, weight loss, and fatigue.  Skin:   Patient denies skin rash/ lesion and itching.  Eyes:   Patient denies blurred vision and double vision.  Ears/  Nose/ Throat:   Patient denies sore throat and sinus problems.  Hematologic/Lymphatic:   Patient denies swollen glands and easy bruising.  Cardiovascular:   Patient denies leg swelling and chest pains.  Respiratory:   Patient denies cough and shortness of breath.  Endocrine:   Patient denies excessive thirst.  Musculoskeletal:   Patient denies back pain and joint pain.  Neurological:   Patient denies headaches and dizziness.  Psychologic:   Patient denies depression and anxiety.   Notes: Ernest Hudson states has hernia on right side. Abt size of egg.     VITAL SIGNS:      07/06/2020 09:08 AM  BP 152/77 mmHg  Pulse 54 /min  Temperature 98.4 F / 36.8 C   GU PHYSICAL EXAMINATION:    Prostate: Prostate about 30 grams. Left lobe normal consistency, right lobe normal consistency. Symmetrical lobes. No prostate nodule. Left lobe no tenderness, right lobe no tenderness.    MULTI-SYSTEM PHYSICAL EXAMINATION:    Constitutional: Well-nourished. No physical deformities. Normally developed. Good grooming.  Respiratory: No labored breathing, no use of accessory muscles. Clear bilaterally.  Cardiovascular: Normal temperature, normal extremity pulses, no swelling, no varicosities. Regular rate and rhythm.  Gastrointestinal: No mass, no tenderness, no rigidity, non obese abdomen.      Complexity of Data:  Lab Test Review:   PSA  Records Review:   Pathology Reports, Previous Patient Records  X-Ray Review:  C.T. Abdomen/Pelvis: Reviewed Films.     02/10/20  PSA  Total PSA 4.90 ng/ml    PROCEDURES:          Urinalysis Dipstick Dipstick Cont'd  Color: Yellow Bilirubin: Neg mg/dL  Appearance: Clear Ketones: Neg mg/dL  Specific Gravity: 1.010 Blood: Neg ery/uL  pH: 6.5 Protein: Neg mg/dL  Glucose: Neg mg/dL Urobilinogen: 0.2 mg/dL    Nitrites: Neg    Leukocyte Esterase: Neg leu/uL    ASSESSMENT:      ICD-10 Details  1 GU:   Prostate Cancer - C61    PLAN:           Schedule Return Visit/Planned  Activity: Keep Scheduled Appointment          Document Letter(s):  Created for Patient: Clinical Summary         Notes:   1. Unfavorable intermediate risk prostate cancer: Ernest Hudson has made the decision to proceed with surgical therapy. The patient was counseled about the natural history of prostate cancer and the standard treatment options that are available for prostate cancer. It was explained to him how his age and life expectancy, clinical stage, Gleason score, and PSA affect his prognosis, the decision to proceed with additional staging studies, as well as how that information influences recommended treatment strategies. We discussed the roles for active surveillance, radiation therapy, surgical therapy, androgen deprivation, as well as ablative therapy options for the treatment of prostate cancer as appropriate to his individual cancer situation. We discussed the risks and benefits of these options with regard to their impact on cancer control and also in terms of potential adverse events, complications, and impact on quality of life particularly related to urinary and sexual function. The patient was encouraged to ask questions throughout the discussion today and all questions were answered to his stated satisfaction. In addition, the patient was provided with and/or directed to appropriate resources and literature for further education about prostate cancer and treatment options. We discussed surgical therapy for prostate cancer including the different available surgical approaches. We discussed, in detail, the risks and expectations of surgery with regard to cancer control, urinary control, and erectile function as well as the expected postoperative recovery process. Additional risks of surgery including but not limited to bleeding, infection, hernia formation, nerve damage, lymphocele formation, bowel/rectal injury potentially necessitating colostomy, damage to the urinary tract resulting in  urine leakage, urethral stricture, and the cardiopulmonary risks such as myocardial infarction, stroke, death, venothromboembolism, etc. were explained. The risk of open surgical conversion for robotic/laparoscopic prostatectomy was also discussed.   Although he does have good baseline erectile dysfunction, this is not a very high priority for him. We discussed proceeding with a bilateral partial nerve-sparing radical prostatectomy. We also discussed his extensive surgical history and I have reviewed his operative notes as well as his CT scan today. I do have some concern that he may have significant bowel adhesions that may preclude minimally invasive radical prostatectomy. He understands that we would plan to examine the abdomen in make that decision at the time of surgery as to whether we could proceed robotically or not. On his CT scan, he does appear to have less adhesions in the midline toward the upper abdomen. There appeared to be significant adhesions laterally on both sides in the lower abdomen that may not be ideal for obtaining access. My tentative plan is to try to make an incision above his prior incision understanding that he may have mesh in this area. If  unable to obtain open access in this area, I might consider placing a 5 mm access in the upper abdomen. He understands the significant risk of open surgical conversion. He has been scheduled for a radical prostatectomy and bilateral pelvic lymphadenectomy.   Cc: Dr. Franchot Gallo  Dr. Viviana Simpler                Next Appointment:      Next Appointment: 07/09/2020 09:00 AM    Appointment Type: 60 Physical Therapy    Location: [facility] [phone]    Provider: Lovenia Kim      E & M CODES: We spent 62 minutes dedicated to evaluation and management time, including face to face interaction, discussions on coordination of care, documentation, result review, and discussion with others as applicable.      * Signed by Raynelle Bring, M.D. on 07/06/20 at 6:01 PM (EDT)*

## 2020-08-05 ENCOUNTER — Encounter (HOSPITAL_COMMUNITY)
Admission: RE | Disposition: A | Discharge status: Home / Self Care | Payer: Self-pay | Source: Home / Self Care | Attending: Urology

## 2020-08-05 ENCOUNTER — Other Ambulatory Visit: Payer: Self-pay

## 2020-08-05 ENCOUNTER — Encounter (HOSPITAL_COMMUNITY): Payer: Self-pay | Admitting: Urology

## 2020-08-05 ENCOUNTER — Observation Stay (HOSPITAL_COMMUNITY)
Admission: RE | Admit: 2020-08-05 | Discharge: 2020-08-06 | Disposition: A | Discharge status: Home / Self Care | Payer: Medicare Other | Attending: Urology | Admitting: Urology

## 2020-08-05 ENCOUNTER — Ambulatory Visit (HOSPITAL_COMMUNITY): Payer: Medicare Other | Admitting: Anesthesiology

## 2020-08-05 DIAGNOSIS — I10 Essential (primary) hypertension: Secondary | ICD-10-CM | POA: Diagnosis not present

## 2020-08-05 DIAGNOSIS — Z8042 Family history of malignant neoplasm of prostate: Secondary | ICD-10-CM | POA: Insufficient documentation

## 2020-08-05 DIAGNOSIS — Z87891 Personal history of nicotine dependence: Secondary | ICD-10-CM | POA: Insufficient documentation

## 2020-08-05 DIAGNOSIS — Z7982 Long term (current) use of aspirin: Secondary | ICD-10-CM | POA: Diagnosis not present

## 2020-08-05 DIAGNOSIS — Z79899 Other long term (current) drug therapy: Secondary | ICD-10-CM | POA: Insufficient documentation

## 2020-08-05 DIAGNOSIS — Z8546 Personal history of malignant neoplasm of prostate: Secondary | ICD-10-CM | POA: Diagnosis present

## 2020-08-05 DIAGNOSIS — C61 Malignant neoplasm of prostate: Principal | ICD-10-CM | POA: Insufficient documentation

## 2020-08-05 HISTORY — PX: ROBOT ASSISTED LAPAROSCOPIC RADICAL PROSTATECTOMY: SHX5141

## 2020-08-05 HISTORY — PX: ROBOTIC ASSISTED LAPAROSCOPIC LYSIS OF ADHESION: SHX6080

## 2020-08-05 HISTORY — PX: LYMPHADENECTOMY: SHX5960

## 2020-08-05 LAB — TYPE AND SCREEN
ABO/RH(D): B NEG
Antibody Screen: NEGATIVE

## 2020-08-05 LAB — HEMOGLOBIN AND HEMATOCRIT, BLOOD
HCT: 43.7 % (ref 39.0–52.0)
Hemoglobin: 15.9 g/dL (ref 13.0–17.0)

## 2020-08-05 LAB — ABO/RH: ABO/RH(D): B NEG

## 2020-08-05 SURGERY — XI ROBOTIC ASSISTED LAPAROSCOPIC RADICAL PROSTATECTOMY LEVEL 3
Anesthesia: General

## 2020-08-05 MED ORDER — HYDROMORPHONE HCL 1 MG/ML IJ SOLN
0.2500 mg | INTRAMUSCULAR | Status: DC | PRN
  Administered 2020-08-05 (×3): 0.5 mg via INTRAVENOUS

## 2020-08-05 MED ORDER — DIPHENHYDRAMINE HCL 12.5 MG/5ML PO ELIX: 12.5000 mg | ORAL_SOLUTION | Freq: Four times a day (QID) | ORAL | Status: DC | PRN

## 2020-08-05 MED ORDER — MIDAZOLAM HCL 2 MG/2ML IJ SOLN
INTRAMUSCULAR | Status: AC
  Filled 2020-08-05: qty 2

## 2020-08-05 MED ORDER — PROPOFOL 10 MG/ML IV BOLUS
INTRAVENOUS | Status: DC | PRN
  Administered 2020-08-05: 170 mg via INTRAVENOUS

## 2020-08-05 MED ORDER — BELLADONNA ALKALOIDS-OPIUM 16.2-60 MG RE SUPP: 1.0000 | Freq: Four times a day (QID) | RECTAL | Status: DC | PRN

## 2020-08-05 MED ORDER — AMLODIPINE BESYLATE 5 MG PO TABS
5.0000 mg | ORAL_TABLET | Freq: Every day | ORAL | Status: DC
  Administered 2020-08-05 – 2020-08-06 (×2): 5 mg via ORAL
  Filled 2020-08-05 (×2): qty 1

## 2020-08-05 MED ORDER — SODIUM CHLORIDE 0.9 % IR SOLN
Status: DC | PRN
  Administered 2020-08-05: 1000 mL

## 2020-08-05 MED ORDER — EPHEDRINE 5 MG/ML INJ
INTRAVENOUS | Status: AC
  Filled 2020-08-05: qty 10

## 2020-08-05 MED ORDER — CEFAZOLIN SODIUM-DEXTROSE 1-4 GM/50ML-% IV SOLN
1.0000 g | Freq: Three times a day (TID) | INTRAVENOUS | Status: AC
  Administered 2020-08-05 – 2020-08-06 (×2): 1 g via INTRAVENOUS
  Filled 2020-08-05 (×2): qty 50

## 2020-08-05 MED ORDER — ACETAMINOPHEN 325 MG PO TABS
650.0000 mg | ORAL_TABLET | ORAL | Status: DC | PRN
  Administered 2020-08-06: 650 mg via ORAL
  Filled 2020-08-05: qty 2

## 2020-08-05 MED ORDER — SODIUM CHLORIDE 0.9 % IV BOLUS
1000.0000 mL | Freq: Once | INTRAVENOUS | Status: AC
  Administered 2020-08-05: 1000 mL via INTRAVENOUS

## 2020-08-05 MED ORDER — HYDROMORPHONE HCL 1 MG/ML IJ SOLN
INTRAMUSCULAR | Status: AC
  Filled 2020-08-05: qty 1

## 2020-08-05 MED ORDER — ROCURONIUM BROMIDE 10 MG/ML (PF) SYRINGE
PREFILLED_SYRINGE | INTRAVENOUS | Status: AC
  Filled 2020-08-05: qty 10

## 2020-08-05 MED ORDER — MAGNESIUM CITRATE PO SOLN: 1.0000 | Freq: Once | ORAL | Status: DC

## 2020-08-05 MED ORDER — KETOROLAC TROMETHAMINE 30 MG/ML IJ SOLN
INTRAMUSCULAR | Status: AC
  Filled 2020-08-05: qty 1

## 2020-08-05 MED ORDER — FLUTICASONE PROPIONATE 50 MCG/ACT NA SUSP
2.0000 | Freq: Every day | NASAL | Status: DC | PRN
  Filled 2020-08-05: qty 16

## 2020-08-05 MED ORDER — GLYCOPYRROLATE PF 0.2 MG/ML IJ SOSY
PREFILLED_SYRINGE | INTRAMUSCULAR | Status: DC | PRN
  Administered 2020-08-05: .2 mg via INTRAVENOUS
  Administered 2020-08-05: .1 mg via INTRAVENOUS

## 2020-08-05 MED ORDER — CETIRIZINE HCL 10 MG PO TABS
10.0000 mg | ORAL_TABLET | Freq: Every day | ORAL | Status: DC
  Administered 2020-08-05 – 2020-08-06 (×2): 10 mg via ORAL
  Filled 2020-08-05 (×2): qty 1

## 2020-08-05 MED ORDER — LISINOPRIL 20 MG PO TABS
20.0000 mg | ORAL_TABLET | Freq: Every day | ORAL | Status: DC
  Administered 2020-08-05 – 2020-08-06 (×2): 20 mg via ORAL
  Filled 2020-08-05 (×2): qty 1

## 2020-08-05 MED ORDER — CHLORHEXIDINE GLUCONATE CLOTH 2 % EX PADS
6.0000 | MEDICATED_PAD | Freq: Every day | CUTANEOUS | Status: DC
  Administered 2020-08-06: 6 via TOPICAL

## 2020-08-05 MED ORDER — DOCUSATE SODIUM 100 MG PO CAPS: 100.0000 mg | ORAL_CAPSULE | Freq: Two times a day (BID) | ORAL | Status: DC

## 2020-08-05 MED ORDER — PHENYLEPHRINE HCL (PRESSORS) 10 MG/ML IV SOLN
INTRAVENOUS | Status: AC
  Filled 2020-08-05: qty 1

## 2020-08-05 MED ORDER — GLYCOPYRROLATE PF 0.2 MG/ML IJ SOSY
PREFILLED_SYRINGE | INTRAMUSCULAR | Status: AC
  Filled 2020-08-05: qty 1

## 2020-08-05 MED ORDER — LIDOCAINE 2% (20 MG/ML) 5 ML SYRINGE
INTRAMUSCULAR | Status: AC
  Filled 2020-08-05: qty 5

## 2020-08-05 MED ORDER — CEFAZOLIN SODIUM-DEXTROSE 2-4 GM/100ML-% IV SOLN
2.0000 g | Freq: Once | INTRAVENOUS | Status: AC
  Administered 2020-08-05: 2 g via INTRAVENOUS
  Filled 2020-08-05: qty 100

## 2020-08-05 MED ORDER — HYDROCHLOROTHIAZIDE 25 MG PO TABS
25.0000 mg | ORAL_TABLET | Freq: Every day | ORAL | Status: DC
  Administered 2020-08-05 – 2020-08-06 (×2): 25 mg via ORAL
  Filled 2020-08-05 (×2): qty 1

## 2020-08-05 MED ORDER — KETOROLAC TROMETHAMINE 15 MG/ML IJ SOLN
15.0000 mg | Freq: Four times a day (QID) | INTRAMUSCULAR | Status: DC
  Administered 2020-08-05 – 2020-08-06 (×3): 15 mg via INTRAVENOUS
  Filled 2020-08-05 (×3): qty 1

## 2020-08-05 MED ORDER — ONDANSETRON HCL 4 MG/2ML IJ SOLN
INTRAMUSCULAR | Status: DC | PRN
  Administered 2020-08-05: 4 mg via INTRAVENOUS

## 2020-08-05 MED ORDER — ROCURONIUM BROMIDE 10 MG/ML (PF) SYRINGE
PREFILLED_SYRINGE | INTRAVENOUS | Status: DC | PRN
  Administered 2020-08-05: 30 mg via INTRAVENOUS
  Administered 2020-08-05: 80 mg via INTRAVENOUS
  Administered 2020-08-05: 20 mg via INTRAVENOUS
  Administered 2020-08-05: 30 mg via INTRAVENOUS

## 2020-08-05 MED ORDER — LIDOCAINE HCL 2 % IJ SOLN
INTRAMUSCULAR | Status: AC
  Filled 2020-08-05: qty 40

## 2020-08-05 MED ORDER — LIDOCAINE HCL (PF) 2 % IJ SOLN
INTRAMUSCULAR | Status: DC | PRN
  Administered 2020-08-05: 1 mg/kg/h via INTRADERMAL

## 2020-08-05 MED ORDER — DEXAMETHASONE SODIUM PHOSPHATE 10 MG/ML IJ SOLN
INTRAMUSCULAR | Status: DC | PRN
  Administered 2020-08-05: 10 mg via INTRAVENOUS

## 2020-08-05 MED ORDER — KETOROLAC TROMETHAMINE 30 MG/ML IJ SOLN
INTRAMUSCULAR | Status: DC | PRN
  Administered 2020-08-05: 30 mg via INTRAVENOUS

## 2020-08-05 MED ORDER — BACITRACIN-NEOMYCIN-POLYMYXIN 400-5-5000 EX OINT: 1.0000 "application " | TOPICAL_OINTMENT | Freq: Three times a day (TID) | CUTANEOUS | Status: DC | PRN

## 2020-08-05 MED ORDER — LACTATED RINGERS IV SOLN: INTRAVENOUS | Status: DC | PRN

## 2020-08-05 MED ORDER — KCL IN DEXTROSE-NACL 20-5-0.45 MEQ/L-%-% IV SOLN
INTRAVENOUS | Status: DC
  Filled 2020-08-05 (×4): qty 1000

## 2020-08-05 MED ORDER — PROPOFOL 10 MG/ML IV BOLUS
INTRAVENOUS | Status: AC
  Filled 2020-08-05: qty 20

## 2020-08-05 MED ORDER — LORATADINE 10 MG PO TABS: 10.0000 mg | ORAL_TABLET | Freq: Every day | ORAL | Status: DC

## 2020-08-05 MED ORDER — SULFAMETHOXAZOLE-TRIMETHOPRIM 800-160 MG PO TABS
1.0000 | ORAL_TABLET | Freq: Two times a day (BID) | ORAL | 0 refills | Status: DC
  Filled 2020-08-05: qty 6, 3d supply, fill #0

## 2020-08-05 MED ORDER — FENTANYL CITRATE (PF) 250 MCG/5ML IJ SOLN
INTRAMUSCULAR | Status: AC
  Filled 2020-08-05: qty 5

## 2020-08-05 MED ORDER — BUPIVACAINE-EPINEPHRINE (PF) 0.25% -1:200000 IJ SOLN
INTRAMUSCULAR | Status: AC
  Filled 2020-08-05: qty 30

## 2020-08-05 MED ORDER — ARTIFICIAL TEARS OPHTHALMIC OINT
TOPICAL_OINTMENT | OPHTHALMIC | Status: DC | PRN
  Filled 2020-08-05: qty 3.5

## 2020-08-05 MED ORDER — LISINOPRIL-HYDROCHLOROTHIAZIDE 20-25 MG PO TABS: 1.0000 | ORAL_TABLET | Freq: Every day | ORAL | Status: DC

## 2020-08-05 MED ORDER — TRAMADOL HCL 50 MG PO TABS
50.0000 mg | ORAL_TABLET | Freq: Four times a day (QID) | ORAL | 0 refills | Status: DC | PRN
  Filled 2020-08-05: qty 20, 3d supply, fill #0

## 2020-08-05 MED ORDER — LACTATED RINGERS IV SOLN: INTRAVENOUS | Status: DC

## 2020-08-05 MED ORDER — FLEET ENEMA 7-19 GM/118ML RE ENEM: 1.0000 | ENEMA | Freq: Once | RECTAL | Status: DC

## 2020-08-05 MED ORDER — BUPIVACAINE-EPINEPHRINE 0.25% -1:200000 IJ SOLN
INTRAMUSCULAR | Status: DC | PRN
  Administered 2020-08-05: 30 mL

## 2020-08-05 MED ORDER — ONDANSETRON HCL 4 MG/2ML IJ SOLN: 4.0000 mg | INTRAMUSCULAR | Status: DC | PRN

## 2020-08-05 MED ORDER — ORAL CARE MOUTH RINSE: 15.0000 mL | Freq: Once | OROMUCOSAL | Status: AC

## 2020-08-05 MED ORDER — FENTANYL CITRATE (PF) 250 MCG/5ML IJ SOLN
INTRAMUSCULAR | Status: DC | PRN
  Administered 2020-08-05: 50 ug via INTRAVENOUS
  Administered 2020-08-05: 100 ug via INTRAVENOUS
  Administered 2020-08-05 (×2): 50 ug via INTRAVENOUS

## 2020-08-05 MED ORDER — DEXAMETHASONE SODIUM PHOSPHATE 10 MG/ML IJ SOLN
INTRAMUSCULAR | Status: AC
  Filled 2020-08-05: qty 1

## 2020-08-05 MED ORDER — SUGAMMADEX SODIUM 200 MG/2ML IV SOLN
INTRAVENOUS | Status: DC | PRN
  Administered 2020-08-05: 200 mg via INTRAVENOUS

## 2020-08-05 MED ORDER — DOCUSATE SODIUM 100 MG PO CAPS
100.0000 mg | ORAL_CAPSULE | Freq: Two times a day (BID) | ORAL | Status: DC
  Administered 2020-08-05 – 2020-08-06 (×2): 100 mg via ORAL
  Filled 2020-08-05 (×2): qty 1

## 2020-08-05 MED ORDER — HEPARIN SODIUM (PORCINE) 1000 UNIT/ML IJ SOLN
INTRAMUSCULAR | Status: AC
  Filled 2020-08-05: qty 1

## 2020-08-05 MED ORDER — CHLORHEXIDINE GLUCONATE 0.12 % MT SOLN
15.0000 mL | Freq: Once | OROMUCOSAL | Status: AC
  Administered 2020-08-05: 15 mL via OROMUCOSAL

## 2020-08-05 MED ORDER — ACETAMINOPHEN 500 MG PO TABS
1000.0000 mg | ORAL_TABLET | Freq: Once | ORAL | Status: AC
  Administered 2020-08-05: 1000 mg via ORAL
  Filled 2020-08-05: qty 2

## 2020-08-05 MED ORDER — MORPHINE SULFATE (PF) 2 MG/ML IV SOLN: 2.0000 mg | INTRAVENOUS | Status: DC | PRN

## 2020-08-05 MED ORDER — MIDAZOLAM HCL 5 MG/5ML IJ SOLN
INTRAMUSCULAR | Status: DC | PRN
  Administered 2020-08-05: 2 mg via INTRAVENOUS

## 2020-08-05 MED ORDER — ZOLPIDEM TARTRATE 5 MG PO TABS: 5.0000 mg | ORAL_TABLET | Freq: Every evening | ORAL | Status: DC | PRN

## 2020-08-05 MED ORDER — DIPHENHYDRAMINE HCL 50 MG/ML IJ SOLN: 12.5000 mg | Freq: Four times a day (QID) | INTRAMUSCULAR | Status: DC | PRN

## 2020-08-05 MED ORDER — ONDANSETRON HCL 4 MG/2ML IJ SOLN
INTRAMUSCULAR | Status: AC
  Filled 2020-08-05: qty 2

## 2020-08-05 MED ORDER — LIDOCAINE 2% (20 MG/ML) 5 ML SYRINGE
INTRAMUSCULAR | Status: DC | PRN
  Administered 2020-08-05: 60 mg via INTRAVENOUS

## 2020-08-05 MED ORDER — EPHEDRINE SULFATE-NACL 50-0.9 MG/10ML-% IV SOSY
PREFILLED_SYRINGE | INTRAVENOUS | Status: DC | PRN
  Administered 2020-08-05: 10 mg via INTRAVENOUS

## 2020-08-05 MED ORDER — PHENYLEPHRINE HCL-NACL 10-0.9 MG/250ML-% IV SOLN
INTRAVENOUS | Status: DC | PRN
  Administered 2020-08-05: 15 ug/min via INTRAVENOUS

## 2020-08-05 SURGICAL SUPPLY — 66 items
ADH SKN CLS APL DERMABOND .7 (GAUZE/BANDAGES/DRESSINGS) ×3
APL PRP STRL LF DISP 70% ISPRP (MISCELLANEOUS) ×3
APL SWBSTK 6 STRL LF DISP (MISCELLANEOUS) ×3
APPLICATOR COTTON TIP 6 STRL (MISCELLANEOUS) ×3 IMPLANT
APPLICATOR COTTON TIP 6IN STRL (MISCELLANEOUS) ×4
CATH FOLEY 2WAY SLVR 18FR 30CC (CATHETERS) ×4 IMPLANT
CATH ROBINSON RED A/P 16FR (CATHETERS) ×4 IMPLANT
CATH ROBINSON RED A/P 8FR (CATHETERS) ×4 IMPLANT
CATH TIEMANN FOLEY 18FR 5CC (CATHETERS) ×4 IMPLANT
CHLORAPREP W/TINT 26 (MISCELLANEOUS) ×4 IMPLANT
CLIP VESOLOCK LG 6/CT PURPLE (CLIP) ×8 IMPLANT
COVER SURGICAL LIGHT HANDLE (MISCELLANEOUS) ×4 IMPLANT
COVER TIP SHEARS 8 DVNC (MISCELLANEOUS) ×3 IMPLANT
COVER TIP SHEARS 8MM DA VINCI (MISCELLANEOUS) ×4
COVER WAND RF STERILE (DRAPES) IMPLANT
CUTTER ECHEON FLEX ENDO 45 340 (ENDOMECHANICALS) ×4 IMPLANT
DECANTER SPIKE VIAL GLASS SM (MISCELLANEOUS) ×3 IMPLANT
DERMABOND ADVANCED (GAUZE/BANDAGES/DRESSINGS) ×1
DERMABOND ADVANCED .7 DNX12 (GAUZE/BANDAGES/DRESSINGS) ×3 IMPLANT
DRAIN CHANNEL RND F F (WOUND CARE) IMPLANT
DRAPE ARM DVNC X/XI (DISPOSABLE) ×12 IMPLANT
DRAPE COLUMN DVNC XI (DISPOSABLE) ×3 IMPLANT
DRAPE DA VINCI XI ARM (DISPOSABLE) ×16
DRAPE DA VINCI XI COLUMN (DISPOSABLE) ×4
DRAPE SURG IRRIG POUCH 19X23 (DRAPES) ×4 IMPLANT
DRSG TEGADERM 4X4.75 (GAUZE/BANDAGES/DRESSINGS) ×4 IMPLANT
ELECT PENCIL ROCKER SW 15FT (MISCELLANEOUS) ×4 IMPLANT
ELECT REM PT RETURN 15FT ADLT (MISCELLANEOUS) ×4 IMPLANT
GLOVE SURG ENC MOIS LTX SZ6.5 (GLOVE) ×4 IMPLANT
GLOVE SURG ENC TEXT LTX SZ7.5 (GLOVE) ×8 IMPLANT
GOWN STRL REUS W/TWL LRG LVL3 (GOWN DISPOSABLE) ×12 IMPLANT
HOLDER FOLEY CATH W/STRAP (MISCELLANEOUS) ×4 IMPLANT
IRRIG SUCT STRYKERFLOW 2 WTIP (MISCELLANEOUS) ×4
IRRIGATION SUCT STRKRFLW 2 WTP (MISCELLANEOUS) ×3 IMPLANT
KIT TURNOVER KIT A (KITS) ×4 IMPLANT
NDL SAFETY ECLIPSE 18X1.5 (NEEDLE) ×3 IMPLANT
NDL SPNL 20GX3.5 QUINCKE YW (NEEDLE) IMPLANT
NEEDLE HYPO 18GX1.5 SHARP (NEEDLE) ×4
NEEDLE SPNL 20GX3.5 QUINCKE YW (NEEDLE) ×4 IMPLANT
PACK ROBOT UROLOGY CUSTOM (CUSTOM PROCEDURE TRAY) ×4 IMPLANT
PENCIL SMOKE EVACUATOR (MISCELLANEOUS) IMPLANT
RELOAD STAPLE 45 4.1 GRN THCK (STAPLE) ×3 IMPLANT
SCISSORS LAP 5X45 EPIX DISP (ENDOMECHANICALS) ×1 IMPLANT
SEAL CANN UNIV 5-8 DVNC XI (MISCELLANEOUS) ×12 IMPLANT
SEAL XI 5MM-8MM UNIVERSAL (MISCELLANEOUS) ×16
SET TUBE SMOKE EVAC HIGH FLOW (TUBING) ×4 IMPLANT
SLEEVE XCEL OPT CAN 5 100 (ENDOMECHANICALS) ×1 IMPLANT
SOLUTION ELECTROLUBE (MISCELLANEOUS) ×4 IMPLANT
STAPLE RELOAD 45 GRN (STAPLE) ×3 IMPLANT
STAPLE RELOAD 45MM GREEN (STAPLE) ×4
SUT ETHILON 3 0 PS 1 (SUTURE) ×4 IMPLANT
SUT MNCRL 3 0 RB1 (SUTURE) ×3 IMPLANT
SUT MNCRL 3 0 VIOLET RB1 (SUTURE) ×3 IMPLANT
SUT MNCRL AB 4-0 PS2 18 (SUTURE) ×8 IMPLANT
SUT MONOCRYL 3 0 RB1 (SUTURE) ×8
SUT SILK 3 0 SH 30 (SUTURE) ×1 IMPLANT
SUT VIC AB 0 CT1 27 (SUTURE) ×8
SUT VIC AB 0 CT1 27XBRD ANTBC (SUTURE) ×3 IMPLANT
SUT VIC AB 0 UR5 27 (SUTURE) ×4 IMPLANT
SUT VIC AB 2-0 SH 27 (SUTURE) ×4
SUT VIC AB 2-0 SH 27X BRD (SUTURE) ×3 IMPLANT
SUT VICRYL 0 UR6 27IN ABS (SUTURE) ×8 IMPLANT
SYR 27GX1/2 1ML LL SAFETY (SYRINGE) ×4 IMPLANT
TOWEL OR NON WOVEN STRL DISP B (DISPOSABLE) ×4 IMPLANT
TROCAR XCEL NON-BLD 5MMX100MML (ENDOMECHANICALS) ×1 IMPLANT
WATER STERILE IRR 1000ML POUR (IV SOLUTION) ×4 IMPLANT

## 2020-08-05 NOTE — Anesthesia Postprocedure Evaluation (Signed)
Anesthesia Post Note  Patient: Ernest Hudson  Procedure(s) Performed: XI ROBOTIC ASSISTED LAPAROSCOPIC RADICAL PROSTATECTOMY LEVEL 3 LYMPHADENECTOMY, PELVIC (Bilateral) XI ROBOTIC ASSISTED LAPAROSCOPIC LYSIS OF ADHESION     Patient location during evaluation: PACU Anesthesia Type: General Level of consciousness: awake and alert Pain management: pain level controlled Vital Signs Assessment: post-procedure vital signs reviewed and stable Respiratory status: spontaneous breathing, nonlabored ventilation, respiratory function stable and patient connected to nasal cannula oxygen Cardiovascular status: blood pressure returned to baseline and stable Postop Assessment: no apparent nausea or vomiting Anesthetic complications: no   No notable events documented.  Last Vitals:  Vitals:   08/05/20 1230 08/05/20 1245  BP: (!) 150/86 (!) 146/80  Pulse: 81 78  Resp: 11 12  Temp:    SpO2: 100% 99%    Last Pain:  Vitals:   08/05/20 1300  TempSrc:   PainSc: 2                  Shamell Suarez,W. EDMOND

## 2020-08-05 NOTE — Anesthesia Procedure Notes (Signed)

## 2020-08-05 NOTE — Op Note (Signed)
Preoperative diagnosis: Clinically localized adenocarcinoma of the prostate (clinical stage T1c N0 Mx)  Postoperative diagnosis: Clinically localized adenocarcinoma of the prostate (clinical stage T1c N0 Mx)  Procedure:  Robotic assisted laparoscopic radical prostatectomy (bilateral nerve sparing) Bilateral robotic assisted laparoscopic pelvic lymphadenectomy Laparoscopic adhesiolysis  Surgeon: Pryor Curia. M.D.  Assistant: Debbrah Alar, PA-C  An assistant was required for this surgical procedure.  The duties of the assistant included but were not limited to suctioning, passing suture, camera manipulation, retraction. This procedure would not be able to be performed without an Environmental consultant.  Resident: Dr. Ermelinda Das  Anesthesia: General  Complications: None  EBL: 100 mL  IVF:  1500 mL crystalloid  Specimens: Prostate and seminal vesicles Right pelvic lymph nodes Left pelvic lymph nodes  Disposition of specimens: Pathology  Drains: 20 Fr coude catheter # 19 Blake pelvic drain  Indication: Ernest Hudson is a 65 y.o. year old patient with clinically localized prostate cancer.  After a thorough review of the management options for treatment of prostate cancer, he elected to proceed with surgical therapy and the above procedure(s).  We have discussed the potential benefits and risks of the procedure, side effects of the proposed treatment, the likelihood of the patient achieving the goals of the procedure, and any potential problems that might occur during the procedure or recuperation. Informed consent has been obtained.  Description of procedure:  The patient was taken to the operating room and a general anesthetic was administered. He was given preoperative antibiotics, placed in the dorsal lithotomy position, and prepped and draped in the usual sterile fashion. Next a preoperative timeout was performed. A urethral catheter was placed into the bladder and a site was  selected near the umbilicus for placement of the camera port.  Considering his prior ventral mesh hernia repair, a site was selected in the upper midline above his prior incision where no mesh was palpable. This was placed using a standard open Hassan technique which allowed entry into the peritoneal cavity under direct vision and without difficulty. An 8 mm robotic port was placed and a pneumoperitoneum established. The camera was then used to inspect the abdomen.  There were extensive small bowel and omental adhesion to the abdominal wall in the midline below the level of the camera. Ports were then placed as able.  An 8 mm robotic port was placed in the left lateral abdominal wall.  A 5 mm port was placed in the right upper quadrant.  Laparoscopic scissors were used to take down adhesions over the right lateral abdominal wall and a 12 mm port was placed in the right lateral abdominal wall.  An extra 5 mm port was placed in the right lower quadrant and another extra 5 mm port was placed in the left upper quadrant.  This allowed most of the small bowel adhesions to be taken down. Ultimately, there was a remaining area of small bowel adhesions that were very stuck up against the hernia mesh.  I therefore asked the patient's general surgeon, Dr. Greer Pickerel, to come for intraoperative consultation.  He excised the remaining adhesions and has dictated this portion of the procedure separately.  The remaining two 8 mm ports were placed in the right and left abdomen but just outside the mesh. All ports were placed under direct vision without difficulty. The surgical cart was then docked.   Utilizing the cautery scissors, the bladder was reflected posteriorly allowing entry into the space of Retzius and identification of the endopelvic  fascia and prostate. The periprostatic fat was then removed from the prostate allowing full exposure of the endopelvic fascia. The endopelvic fascia was then incised from the apex back to  the base of the prostate bilaterally and the underlying levator muscle fibers were swept laterally off the prostate thereby isolating the dorsal venous complex. The dorsal vein was then stapled and divided with a 45 mm Flex Echelon stapler. Attention then turned to the bladder neck which was divided anteriorly thereby allowing entry into the bladder and exposure of the urethral catheter. The catheter balloon was deflated and the catheter was brought into the operative field and used to retract the prostate anteriorly. The posterior bladder neck was then examined and was divided allowing further dissection between the bladder and prostate posteriorly until the vasa deferentia and seminal vessels were identified. The vasa deferentia were isolated, divided, and lifted anteriorly. The seminal vesicles were dissected down to their tips with care to control the seminal vascular arterial blood supply. These structures were then lifted anteriorly and the space between Denonvillier's fascia and the anterior rectum was developed with a combination of sharp and blunt dissection. This isolated the vascular pedicles of the prostate.  The lateral prostatic fascia was then sharply incised allowing release of the neurovascular bundles bilaterally. The vascular pedicles of the prostate were then ligated with Weck clips between the prostate and neurovascular bundles and divided with sharp cold scissor dissection resulting in neurovascular bundle preservation. The neurovascular bundles were then separated off the apex of the prostate and urethra bilaterally.  The urethra was then sharply transected allowing the prostate specimen to be disarticulated. The pelvis was copiously irrigated and hemostasis was ensured. There was no evidence for rectal injury.  Attention then turned to the right pelvic sidewall. The fibrofatty tissue between the external iliac vein, confluence of the iliac vessels, hypogastric artery, and Cooper's  ligament was dissected free from the pelvic sidewall with care to preserve the obturator nerve. Weck clips were used for lymphostasis and hemostasis. An identical procedure was performed on the contralateral side and the lymphatic packets were removed for permanent pathologic analysis.  Attention then turned to the urethral anastomosis. A 2-0 Vicryl slip knot was placed between Denonvillier's fascia, the posterior bladder neck, and the posterior urethra to reapproximate these structures. A double-armed 3-0 Monocryl suture was then used to perform a 360 running tension-free anastomosis between the bladder neck and urethra. A new urethral catheter was then placed into the bladder and irrigated. There were no blood clots within the bladder and the anastomosis appeared to be watertight. The small bowel was then examined and after running the small intestine there was a questionable area of a small serosal tear that was repaired with 3-0 silk Lembert sutures as a precaution. A #19 Blake drain was then brought through the left lateral 8 mm port site and positioned appropriately within the pelvis. It was secured to the skin with a nylon suture. The surgical cart was then undocked. The right lateral 12 mm port site was closed at the fascial level with a 0 Vicryl suture placed laparoscopically. All remaining ports were then removed under direct vision. The prostate specimen was removed intact within the Endopouch retrieval bag via the periumbilical camera port site. This fascial opening was closed with two running 0 Vicryl sutures. 0.25% Marcaine was then injected into all port sites and all incisions were reapproximated at the skin level with 4-0 Monocryl subcuticular sutures and Dermabond. The patient appeared to tolerate the  procedure well and without complications. The patient was able to be extubated and transferred to the recovery unit in satisfactory condition.   Pryor Curia MD

## 2020-08-05 NOTE — Interval H&P Note (Signed)
History and Physical Interval Note:  08/05/2020 6:49 AM  Ernest Hudson  has presented today for surgery, with the diagnosis of PROSTATE CANCER.  The various methods of treatment have been discussed with the patient and family. After consideration of risks, benefits and other options for treatment, the patient has consented to  Procedure(s) with comments: XI ROBOTIC ASSISTED LAPAROSCOPIC RADICAL PROSTATECTOMY LEVEL 3 (N/A) - ONLY NEEDS 210 MIN LYMPHADENECTOMY, PELVIC (Bilateral) as a surgical intervention.  The patient's history has been reviewed, patient examined, no change in status, stable for surgery.  I have reviewed the patient's chart and labs.  Questions were answered to the patient's satisfaction.     [employer]

## 2020-08-05 NOTE — Progress Notes (Signed)
Patient ID: Ernest Hudson, male   DOB: [birth year], 65 y.o.   MRN: [id number]  Post-op note  Subjective: The patient is doing well.  No complaints.  Objective: Vital signs in last 24 hours: Temp:  [97.5 F (36.4 C)-98.3 F (36.8 C)] 97.5 F (36.4 C) (06/16 1602) Pulse Rate:  [51-97] 74 (06/16 1602) Resp:  [8-18] 18 (06/16 1501) BP: (130-164)/(67-104) 151/82 (06/16 1501) SpO2:  [96 %-100 %] 100 % (06/16 1501) Weight:  [90.8 kg] 90.8 kg (06/16 0622)  Intake/Output from previous day: No intake/output data recorded. Intake/Output this shift: Total I/O In: 3149 [I.V.:2050; IV Piggyback:1099] Out: 500 [Urine:330; Drains:120; Blood:50]  Physical Exam:  General: Alert and oriented. Abdomen: Soft, Nondistended. Incisions: Clean and dry. GU: Urine clear.  Lab Results: Recent Labs    08/05/20 1327  HGB 15.9  HCT 43.7    Assessment/Plan: POD#0   1) Continue to monitor, ambulate, IS   Pryor Curia. MD   LOS: 0 days   Dutch Gray 08/05/2020, 4:37 PM

## 2020-08-05 NOTE — Discharge Instructions (Signed)

## 2020-08-05 NOTE — Transfer of Care (Signed)
Immediate Anesthesia Transfer of Care Note  Patient: Ernest Hudson  Procedure(s) Performed: XI ROBOTIC ASSISTED LAPAROSCOPIC RADICAL PROSTATECTOMY LEVEL 3 LYMPHADENECTOMY, PELVIC (Bilateral) XI ROBOTIC ASSISTED LAPAROSCOPIC LYSIS OF ADHESION  Patient Location: PACU  Anesthesia Type:General  Level of Consciousness: awake, alert , oriented and patient cooperative  Airway & Oxygen Therapy: Patient Spontanous Breathing and Patient connected to face mask oxygen  Post-op Assessment: Report given to RN, Post -op Vital signs reviewed and stable and Patient moving all extremities  Post vital signs: Reviewed and stable  Last Vitals:  Vitals Value Taken Time  BP 159/99 08/05/20 1203  Temp    Pulse 91 08/05/20 1208  Resp 11 08/05/20 1208  SpO2 100 % 08/05/20 1208  Vitals shown include unvalidated device data.  Last Pain:  Vitals:   08/05/20 0622  TempSrc:   PainSc: 0-No pain         Complications: No notable events documented.

## 2020-08-05 NOTE — Op Note (Signed)
08/05/2020  9:38 AM  PATIENT:  Ernest Hudson  65 y.o. male  PRE-OPERATIVE DIAGNOSIS:  PROSTATE CANCER; h/o of open repair of ventral incisional hernia with mesh with posterior component separation 04/2014  POST-OPERATIVE DIAGNOSIS:  same + intra-abdominal adhesions  PROCEDURE:  Procedure(s): XI ROBOTIC ASSISTED LAPAROSCOPIC RADICAL PROSTATECTOMY LEVEL 3 LYMPHADENECTOMY, PELVIC - Dr Alinda Money  LAPAROSCOPIC LYSIS OF ADHESIONS - 30 minutes - Dr [name]  SURGEON:  Surgeon(s): Raynelle Bring, MD Greer Pickerel, MD   ASSISTANTS: Debbrah Alar PA-C   ANESTHESIA:   general  DRAINS: see dr borden's op note   LOCAL MEDICATIONS USED:  MARCAINE     SPECIMEN:  Source of Specimen:  see Dr Lynne Logan op note  DISPOSITION OF SPECIMEN:  PATHOLOGY  COUNTS:  YES  INDICATION FOR PROCEDURE: Patient is a 65 year old gentleman who was diagnosed with localized prostate cancer.  He was undergoing a planned robotic assisted laparoscopic radical prostatectomy with pelvic lymphadenectomy.  He had previously undergone an open retrorectus repair of incisional hernia by myself back in 2016.  I was called intraoperatively to address small bowel adhesions to the anterior abdominal wall.  PROCEDURE: Please see Dr. Lynne Logan operative note for details regarding the procedure.  Dr. Alinda Money had discussed with me the morning of surgery that if there were intra-abdominal adhesions that he would feel more comfortable with me managing if I would be available.  I was on campus and was available.  I was called into the operating room.  He had already lysed some of the adhesions.  But there was obvious exposed Ethicon ultra Pro mesh intra-abdominally.  It was flush with the abdominal wall.  Unfortunately there was some dense small bowel adhesions to an area of the exposed mesh.  Dr. Alinda Money and his assistant had already placed numerous trochars in the abdomen.  I was able to use these trochars.  Using an atraumatic bowel grasper and a  pair of laparoscopic EndoShears without cautery I sequentially took down the small bowel adhesions from the anterior abdominal wall.  I did not separate the mesh from the bowel since it was intimately fused with a segment of small bowel.  Instead I cut through some of the mesh.  There was probably about a rectangular 2 cm long by 1 cm wide piece of mesh that was intimately fused with the bowel after we got it mobilized off the abdominal wall.  At this point there were no remaining adhesions to the anterior abdominal wall.  I inspected the small bowel.  There was no evidence of visceral injury where I had lysed adhesions.  After Dr. Alinda Money and his assistant had docked the [employer] surgical robot I went to the robotic surgeon console and reinspected the bowel that I had manipulated.  Again upon closer visualization there is no evidence of serosal tear.  The exposed small bowel with the mesh was intact.  At this point I concluded my portion of the procedure and left them to continue with the robotic prostatectomy.  Please see his operative note for additional details.  PLAN OF CARE:  see urology op note  PATIENT DISPOSITION:  PACU - hemodynamically stable.   Delay start of Pharmacological VTE agent (>24hrs) due to surgical blood loss or risk of bleeding:  no  [name], MD, FACS General, Bariatric, & Minimally Invasive Surgery [facility], [place]

## 2020-08-06 ENCOUNTER — Encounter (HOSPITAL_COMMUNITY): Payer: Self-pay | Admitting: Urology

## 2020-08-06 ENCOUNTER — Other Ambulatory Visit: Payer: Self-pay

## 2020-08-06 DIAGNOSIS — C61 Malignant neoplasm of prostate: Secondary | ICD-10-CM | POA: Diagnosis not present

## 2020-08-06 LAB — HEMOGLOBIN AND HEMATOCRIT, BLOOD
HCT: 37.6 % — ABNORMAL LOW (ref 39.0–52.0)
HCT: 37.6 % — ABNORMAL LOW (ref 39.0–52.0)
Hemoglobin: 12.9 g/dL — ABNORMAL LOW (ref 13.0–17.0)
Hemoglobin: 13.2 g/dL (ref 13.0–17.0)

## 2020-08-06 MED ORDER — BISACODYL 10 MG RE SUPP
10.0000 mg | Freq: Once | RECTAL | Status: AC
  Administered 2020-08-06: 10 mg via RECTAL
  Filled 2020-08-06: qty 1

## 2020-08-06 MED ORDER — TRAMADOL HCL 50 MG PO TABS: 50.0000 mg | ORAL_TABLET | Freq: Four times a day (QID) | ORAL | Status: DC | PRN

## 2020-08-06 NOTE — Discharge Summary (Signed)
  Date of admission: 08/05/2020  Date of discharge: 08/06/2020  Admission diagnosis: Prostate Cancer  Discharge diagnosis: Prostate Cancer  History and Physical: For full details, please see admission history and physical. Briefly, Ernest Hudson is a 65 y.o. gentleman with localized prostate cancer.  After discussing management/treatment options, he elected to proceed with surgical treatment.  Hospital Course: Ernest Hudson was taken to the operating room on 08/05/2020 and underwent a robotic assisted laparoscopic radical prostatectomy. He tolerated this procedure well and without complications. Postoperatively, he was able to be transferred to a regular hospital room following recovery from anesthesia.  He was able to begin ambulating the night of surgery. He remained hemodynamically stable overnight.  He had excellent urine output with appropriately minimal output from his pelvic drain and his pelvic drain was removed on POD #1.  He was transitioned to oral pain medication, tolerated a clear liquid diet, and had met all discharge criteria and was able to be discharged home later on POD#1.  Laboratory values:  Recent Labs    08/05/20 1327 08/06/20 0457 08/06/20 1002  HGB 15.9 12.9* 13.2  HCT 43.7 37.6* 37.6*    Disposition: Home  Discharge instruction: He was instructed to be ambulatory but to refrain from heavy lifting, strenuous activity, or driving. He was instructed on urethral catheter care.  Discharge medications:   Allergies as of 08/06/2020       Reactions   Lipitor [atorvastatin]    Body sore all over   Niacin Itching, Rash        Medication List     STOP taking these medications    Grape Seed 100 MG Caps   ibuprofen 200 MG tablet Commonly known as: ADVIL   levofloxacin 750 MG tablet Commonly known as: LEVAQUIN   Magnesium 250 MG Tabs   Potassium 99 MG Tabs   triamcinolone cream 0.1 % Commonly known as: KENALOG   VITAMIN D PO   zinc gluconate 50 MG  tablet       TAKE these medications    amLODipine 10 MG tablet Commonly known as: NORVASC TAKE 1 TABLET BY MOUTH ONCE DAILY What changed: how much to take   cetirizine 10 MG tablet Commonly known as: ZYRTEC Take 10 mg by mouth every morning.   docusate sodium 100 MG capsule Commonly known as: COLACE Take 1 capsule (100 mg total) by mouth 2 (two) times daily.   fluticasone 50 MCG/ACT nasal spray Commonly known as: FLONASE Place 2 sprays into both nostrils daily as needed. For allergies. What changed:  reasons to take this additional instructions   lisinopril-hydrochlorothiazide 20-25 MG tablet Commonly known as: ZESTORETIC TAKE 1 TABLET BY MOUTH DAILY   OVER THE COUNTER MEDICATION Take 100 mg by mouth daily. Diindolylmethane - DIM   sulfamethoxazole-trimethoprim 800-160 MG tablet Commonly known as: BACTRIM DS Take 1 tablet by mouth 2 (two) times daily. Start the day prior to foley removal appointment   traMADol 50 MG tablet Commonly known as: Ultram Take 1-2 tablets (50-100 mg total) by mouth every 6 (six) hours as needed for moderate pain or severe pain.        Followup: He will followup in 1 week for catheter removal and to discuss his surgical pathology results.

## 2020-08-06 NOTE — Plan of Care (Signed)

## 2020-08-06 NOTE — Progress Notes (Signed)
Patient ID: Ernest Hudson, male   DOB: November 12, 1955, 65 y.o.   MRN: [id number]  1 Day Post-Op Subjective: The patient is doing well.  No nausea or vomiting. Pain across abdomen but controlled.  No flatus.  Objective: Vital signs in last 24 hours: Temp:  [97.4 F (36.3 C)-98.5 F (36.9 C)] 98.2 F (36.8 C) (06/17 0628) Pulse Rate:  [52-97] 54 (06/17 0628) Resp:  [8-18] 18 (06/17 0628) BP: (117-151)/(59-104) 117/59 (06/17 0628) SpO2:  [96 %-100 %] 98 % (06/17 0628)  Intake/Output from previous day: 06/16 0701 - 06/17 0700 In: 3389 [P.O.:240; I.V.:2050; IV Piggyback:1099] Out: 3100 [Urine:2730; Drains:320; Blood:50] Intake/Output this shift: No intake/output data recorded.  Physical Exam:  General: Alert and oriented. CV: RRR Lungs: Clear bilaterally. GI: Soft, Nondistended. Positive bowel sounds Incisions: Clean, dry, and intact Urine: Clear Extremities: Nontender, no erythema, no edema.  Lab Results: Recent Labs    08/05/20 1327 08/06/20 0457  HGB 15.9 12.9*  HCT 43.7 37.6*      Assessment/Plan: POD# 1 s/p robotic prostatectomy.  1) SL IVF 2) Ambulate, Incentive spirometry 3) Transition to oral pain medication 4) Dulcolax suppository 5) D/C pelvic drain later if Hgb stable 6) Re check H/H 7) Plan for likely discharge later today   Pryor Curia. MD   LOS: 0 days   Dutch Gray 08/06/2020, 7:37 AM

## 2020-08-10 LAB — SURGICAL PATHOLOGY

## 2020-09-01 ENCOUNTER — Other Ambulatory Visit (HOSPITAL_COMMUNITY): Payer: Self-pay

## 2020-09-23 ENCOUNTER — Other Ambulatory Visit: Payer: Self-pay

## 2020-09-23 MED FILL — Lisinopril & Hydrochlorothiazide Tab 20-25 MG: ORAL | 90 days supply | Qty: 90 | Fill #1 | Status: AC

## 2020-09-23 MED FILL — Amlodipine Besylate Tab 10 MG (Base Equivalent): ORAL | 90 days supply | Qty: 90 | Fill #1 | Status: AC

## 2020-09-28 ENCOUNTER — Other Ambulatory Visit: Payer: Self-pay

## 2021-01-10 ENCOUNTER — Other Ambulatory Visit: Payer: Self-pay

## 2021-01-10 ENCOUNTER — Other Ambulatory Visit: Payer: Self-pay | Admitting: Internal Medicine

## 2021-01-10 MED ORDER — CARESTART COVID-19 HOME TEST VI KIT
PACK | 0 refills | Status: DC
Start: 2021-01-10 — End: 2021-02-11
  Filled 2021-01-10: qty 2, 4d supply, fill #0

## 2021-01-10 MED ORDER — LISINOPRIL-HYDROCHLOROTHIAZIDE 20-25 MG PO TABS
1.0000 | ORAL_TABLET | Freq: Every day | ORAL | 0 refills | Status: DC
  Filled 2021-01-10: qty 90, 90d supply, fill #0

## 2021-01-10 MED ORDER — AMLODIPINE BESYLATE 10 MG PO TABS
ORAL_TABLET | Freq: Every day | ORAL | 0 refills | Status: DC
  Filled 2021-01-10: qty 90, 90d supply, fill #0

## 2021-02-11 ENCOUNTER — Encounter: Payer: Self-pay | Admitting: Internal Medicine

## 2021-02-11 ENCOUNTER — Ambulatory Visit (INDEPENDENT_AMBULATORY_CARE_PROVIDER_SITE_OTHER): Payer: Medicare Other | Admitting: Internal Medicine

## 2021-02-11 ENCOUNTER — Other Ambulatory Visit: Payer: Self-pay

## 2021-02-11 VITALS — BP 138/84 | HR 64 | Temp 98.3°F | Wt 209.0 lb

## 2021-02-11 DIAGNOSIS — Z0000 Encounter for general adult medical examination without abnormal findings: Secondary | ICD-10-CM | POA: Diagnosis not present

## 2021-02-11 DIAGNOSIS — I10 Essential (primary) hypertension: Secondary | ICD-10-CM | POA: Diagnosis not present

## 2021-02-11 DIAGNOSIS — Z23 Encounter for immunization: Secondary | ICD-10-CM | POA: Diagnosis not present

## 2021-02-11 DIAGNOSIS — I700 Atherosclerosis of aorta: Secondary | ICD-10-CM

## 2021-02-11 DIAGNOSIS — Q8789 Other specified congenital malformation syndromes, not elsewhere classified: Secondary | ICD-10-CM

## 2021-02-11 DIAGNOSIS — F39 Unspecified mood [affective] disorder: Secondary | ICD-10-CM

## 2021-02-11 DIAGNOSIS — Z8546 Personal history of malignant neoplasm of prostate: Secondary | ICD-10-CM

## 2021-02-11 LAB — COMPREHENSIVE METABOLIC PANEL
ALT: 44 U/L (ref 0–53)
AST: 28 U/L (ref 0–37)
Albumin: 4.5 g/dL (ref 3.5–5.2)
Alkaline Phosphatase: 73 U/L (ref 39–117)
BUN: 15 mg/dL (ref 6–23)
CO2: 30 mEq/L (ref 19–32)
Calcium: 9.9 mg/dL (ref 8.4–10.5)
Chloride: 101 mEq/L (ref 96–112)
Creatinine, Ser: 1.1 mg/dL (ref 0.40–1.50)
GFR: 69.93 mL/min (ref >60.00)
Glucose, Bld: 91 mg/dL (ref 70–99)
Potassium: 3.6 mEq/L (ref 3.5–5.1)
Sodium: 139 mEq/L (ref 135–145)
Total Bilirubin: 0.6 mg/dL (ref 0.2–1.2)
Total Protein: 7.7 g/dL (ref 6.0–8.3)

## 2021-02-11 LAB — CBC
HCT: 45.3 % (ref 39.0–52.0)
Hemoglobin: 15.7 g/dL (ref 13.0–17.0)
MCHC: 34.6 g/dL (ref 30.0–36.0)
MCV: 87.3 fl (ref 78.0–100.0)
Platelets: 212 10*3/uL (ref 150.0–400.0)
RBC: 5.19 Mil/uL (ref 4.22–5.81)
RDW: 14.1 % (ref 11.5–15.5)
WBC: 5.5 10*3/uL (ref 4.0–10.5)

## 2021-02-11 LAB — LIPID PANEL
Cholesterol: 191 mg/dL (ref 0–200)
HDL: 35.5 mg/dL — ABNORMAL LOW (ref >39.00)
LDL Cholesterol: 133 mg/dL — ABNORMAL HIGH (ref 0–99)
NonHDL: 155.13
Total CHOL/HDL Ratio: 5
Triglycerides: 113 mg/dL (ref 0.0–149.0)
VLDL: 22.6 mg/dL (ref 0.0–40.0)

## 2021-02-11 MED ORDER — LISINOPRIL-HYDROCHLOROTHIAZIDE 20-25 MG PO TABS: 1.0000 | ORAL_TABLET | Freq: Every day | ORAL | 3 refills | Status: DC

## 2021-02-11 MED ORDER — AMLODIPINE BESYLATE 10 MG PO TABS: ORAL_TABLET | Freq: Every day | ORAL | 3 refills | Status: DC

## 2021-02-11 MED ORDER — ROSUVASTATIN CALCIUM 10 MG PO TABS: 10.0000 mg | ORAL_TABLET | Freq: Every day | ORAL | 3 refills | Status: DC

## 2021-02-11 NOTE — Addendum Note (Signed)
Addended by: Pilar Grammes on: 02/11/2021 11:27 AM   Modules accepted: Orders

## 2021-02-11 NOTE — Assessment & Plan Note (Signed)
BP Readings from Last 3 Encounters:  02/11/21 138/84  08/06/20 130/60  08/02/20 140/74   Control okay on amlodipine and lisinopril/HCTZ Will check labs

## 2021-02-11 NOTE — Assessment & Plan Note (Signed)
Will plan to check kidneys every few years

## 2021-02-11 NOTE — Assessment & Plan Note (Signed)
Next step would be RT if biochemical recurrence

## 2021-02-11 NOTE — Assessment & Plan Note (Signed)
Some dysthymia with issues this year No MDD Will hold off on meds

## 2021-02-11 NOTE — Assessment & Plan Note (Signed)
On imaging Did poorly on niacin and one statin Will try low dose rosuvastatin

## 2021-02-11 NOTE — Assessment & Plan Note (Signed)
I have personally reviewed the Medicare Annual Wellness questionnaire and have noted 1. The patient's medical and social history 2. Their use of alcohol, tobacco or illicit drugs 3. Their current medications and supplements 4. The patient's functional ability including ADL's, fall risks, home safety risks and hearing or visual             impairment. 5. Diet and physical activities 6. Evidence for depression or mood disorders  The patients weight, height, BMI and visual acuity have been recorded in the chart I have made referrals, counseling and provided education to the patient based review of the above and I have provided the pt with a written personalized care plan for preventive services.  I have provided you with a copy of your personalized plan for preventive services. Please take the time to review along with your updated medication list.  Flu vaccine today Prefers no COVID vaccines Consider shingrix when covered Colon due 2027 Discussed increasing exercise and resistance

## 2021-02-11 NOTE — Progress Notes (Signed)
Vision Screening   Right eye Left eye Both eyes  Without correction 20/30 20/40 20/30   With correction     Hearing Screening - Comments:: Deaf in left ear.

## 2021-02-11 NOTE — Progress Notes (Signed)
Subjective:    Patient ID: Ernest Hudson, male    DOB: 10-Nov-1955, 65 y.o.   MRN: [id number]  HPI Here for 1st Medicare wellness visit and follow up of chronic health conditions Reviewed advanced directives Reviewed other doctors---Dr Finn--dentist, Dr Gwen Pounds, Dr Sharyn Blitz Did have total prostatectomy in June (with repair of hernia mesh at the same time). 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 and some pain in right ear Does do some walking/trail hiking--discussed resistance exercise No falls  Mild mood problems Independent with instrumental ADLs Some memory problems---will forget what he is talking about when in the middle of a conversation  Did have prostate biopsy and was positive Had total prostatectomy Testing PSA regularly now Has viagra but not really helping yet  Having some pain when raising up arms above head Has retired--so not doing the lifting he used to do Brief spasms of shoulder pain if he moves it the wrong way No regular meds for this  No chest pain No SOB No dizziness or syncope No edema Rare palpitations ---if "high anxiety" No headaches  Some stress with kids around--one in house/--daughter and 20 year oldone in camper on property (4 people) Occasional depressed mood--but nothing persistent Not anhedonic  Current Outpatient Medications on File Prior to Visit  Medication Sig Dispense Refill   amLODipine (NORVASC) 10 MG tablet TAKE 1 TABLET BY MOUTH ONCE DAILY 90 tablet 0   cetirizine (ZYRTEC) 10 MG tablet Take 10 mg by mouth every morning.     fluticasone (FLONASE) 50 MCG/ACT nasal spray Place 2 sprays into both nostrils daily as needed. For allergies. 16 g 6   lisinopril-hydrochlorothiazide (ZESTORETIC) 20-25 MG tablet TAKE 1 TABLET BY MOUTH DAILY 90 tablet 0   OVER THE COUNTER MEDICATION Take 100 mg by mouth daily. Diindolylmethane - DIM     sildenafil (VIAGRA)  100 MG tablet Take 100 mg by mouth daily.     No current facility-administered medications on file prior to visit.    Allergies  Allergen Reactions   Lipitor [Atorvastatin]     Body sore all over   Niacin Itching and Rash    Past Medical History:  Diagnosis Date   Adrenal mass ([place])    "benign" (04/08/2013   Allergic rhinitis    Arthritis    "probably in my fingers" (04/08/2013)   Basal cell carcinoma 2000's   'chest"   Birt-Hogg-Dube syndrome    Bradycardia    Dyslipidemia (high LDL; low HDL)    Hearing loss in left ear    "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"   History of blood transfusion ?1995   Hx of colonic polyps    Hyperlipidemia    Hypertension    Ileus, postoperative ([place]) 04/17/2013   Spontaneous pneumothorax 1995    Past Surgical History:  Procedure Laterality Date   APPENDECTOMY  04/07/2013   Procedure: OPEN APPENDECTOMY;  Surgeon: Gayland Curry, MD;  Location: [facility];  Service: General;;   cardiolyte stress  04/2008   CHOLECYSTECTOMY  2004   COLONOSCOPY  06/2005   ELBOW SURGERY Left 2001   "tendonitis"   EXTERNAL EAR [place] N/A 05/12/2014   Procedure: OPEN REPAIR OF INCISIONAL HERNIA REPAIR;  Surgeon: Greer Pickerel, MD;  Location: [facility];  Service: General;  Laterality: N/A;   INGUINAL HERNIA REPAIR Left 05/31/2017   Procedure: LEFT INGUINAL HERNIA REPAIR WITH MESH;  Surgeon: Greer Pickerel, MD;  Location: [facility];  Service: General;  Laterality: Left;   INSERTION OF MESH N/A 05/12/2014   Procedure: WITH INSERTION OF MESH;  Surgeon: Greer Pickerel, MD;  Location: [facility];  Service: General;  Laterality: N/A;   INSERTION OF MESH Left 05/31/2017   Procedure: INSERTION OF MESH;  Surgeon: Greer Pickerel, MD;  Location: [facility];  Service: General;  Laterality: Left;   KNEE ARTHROSCOPY Left 1990's   LAPAROSCOPIC APPENDECTOMY N/A 04/07/2013   Procedure: ATTEMPTED APPENDECTOMY LAPAROSCOPIC;  Surgeon:  Gayland Curry, MD;  Location: [facility];  Service: General;  Laterality: N/A;   LUNG SURGERY     "repaired ruptured bleb" '95   LYMPHADENECTOMY Bilateral 08/05/2020   Procedure: LYMPHADENECTOMY, PELVIC;  Surgeon: Raynelle Bring, MD;  Location: [facility];  Service: Urology;  Laterality: Bilateral;   MIDDLE EAR SURGERY Left 1972   exploratory tympanotomy/notes 07/17/2000   NASAL SEPTUM SURGERY     PARTIAL COLECTOMY  04/07/2013   Procedure: PARTIAL COLECTOMY, ILEOCECTOMY;  Surgeon: Gayland Curry, MD;  Location: [facility];  Service: General;;   ROBOT ASSISTED LAPAROSCOPIC RADICAL PROSTATECTOMY N/A 08/05/2020   Procedure: XI ROBOTIC ASSISTED LAPAROSCOPIC RADICAL PROSTATECTOMY LEVEL 3;  Surgeon: Raynelle Bring, MD;  Location: [facility];  Service: Urology;  Laterality: N/A;  ONLY NEEDS 210 MIN   ROBOTIC ASSISTED LAPAROSCOPIC LYSIS OF ADHESION  08/05/2020   Procedure: XI ROBOTIC ASSISTED LAPAROSCOPIC LYSIS OF ADHESION;  Surgeon: Raynelle Bring, MD;  Location: [facility];  Service: Urology;;   SKIN CANCER EXCISION  2000's   "chest"   TENDON REPAIR Left 1990's   "thumb"    Family History  Problem Relation Age of Onset   Healthy Sister    Lung disease Sister        has blebs on MRI,    Breast cancer Maternal Aunt        diagnosed in her 17s   Melanoma Maternal Uncle    Breast cancer Maternal Aunt        diagnosed in her 61s   Breast cancer Maternal Aunt        diagnosed in her 42s   Breast cancer Mother    Diabetes Mother    Cancer Mother        breast   Heart attack Father    Diabetes Father    Prostate cancer Father    Cancer Father        prostate   Healthy Brother    Prostate cancer Brother    Cancer Brother        prostate cancer   Healthy Brother    Cancer Cousin        multiple myeloma   Colon cancer Neg Hx     Social History   Socioeconomic History   Marital status: Married    Spouse name: Not on file   Number of children: 4   Years of education: Not on file   Highest education level:  Not on file  Occupational History   Occupation: Psychologist, 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)    Comment: Retired  Tobacco Use   Smoking status: Former    Packs/day: 2.00    Years: 30.00    Pack years: 60.00    Types: Cigarettes    Quit date: 07/30/2012    Years since quitting: 8.5   Smokeless tobacco: Never  Vaping Use   Vaping Use: Never used  Substance and Sexual Activity   Alcohol  use: No    Alcohol/week: 0.0 standard drinks   Drug use: No   Sexual activity: Not Currently  Other Topics Concern   Not on file  Social History Narrative   Married--1 daughter and 3 step daughters      No living will   Wife should be health care---alternate would be daughter--Melissa   Would accept resuscitation attempts   No tube feeds if cognitively unaware   Social Determinants of Health   Financial Resource Strain: Not on file  Food Insecurity: Not on file  Transportation Needs: Not on file  Physical Activity: Not on file  Stress: Not on file  Social Connections: Not on file  Intimate Partner Violence: Not on file   Review of Systems Appetite is good Weight is stable Sleeps well in general Wears seat belt Teeth are okay---has needed fillings though No suspicious skin lesions now No sig back or knee problems Occasional heartburn---not regular. No dysphagia On zyrtec daily. Flonase prn Bowels are fine--tend to be loose. No blood Voids okay--occ leakage    Objective:   Physical Exam Constitutional:      Appearance: Normal appearance.  HENT:     Right Ear: Tympanic membrane and ear canal normal.     Left Ear: Tympanic membrane and ear canal normal.     Mouth/Throat:     Pharynx: No oropharyngeal exudate or posterior oropharyngeal erythema.     Comments: No lesions Eyes:     Conjunctiva/sclera: Conjunctivae normal.     Pupils: Pupils are equal, round, and reactive to light.  Cardiovascular:     Rate and Rhythm: Normal rate and regular rhythm.     Pulses: Normal pulses.      Heart sounds: No murmur heard.   No gallop.  Pulmonary:     Effort: Pulmonary effort is normal.     Breath sounds: Normal breath sounds. No wheezing or rales.  Abdominal:     Palpations: Abdomen is soft.     Tenderness: There is no abdominal tenderness.  Musculoskeletal:     Cervical back: Neck supple.     Right lower leg: No edema.     Left lower leg: No edema.     Comments: Fairly normal ROM in shoulders  Lymphadenopathy:     Cervical: No cervical adenopathy.  Skin:    Findings: No lesion or rash.  Neurological:     Mental Status: He is alert and oriented to person, place, and time.     Comments: President---"Biden, Trump, black guy" [phone] [id number] Recall 2/3  Psychiatric:        Mood and Affect: Mood normal.        Behavior: Behavior normal.           Assessment & Plan:

## 2021-08-24 ENCOUNTER — Other Ambulatory Visit: Payer: Self-pay

## 2021-08-24 MED ORDER — SILDENAFIL CITRATE 100 MG PO TABS
ORAL_TABLET | ORAL | 11 refills | Status: DC
  Filled 2021-08-24: qty 6, 30d supply, fill #0

## 2021-08-30 ENCOUNTER — Other Ambulatory Visit: Payer: Self-pay

## 2021-08-30 MED ORDER — SILDENAFIL CITRATE 100 MG PO TABS
ORAL_TABLET | ORAL | 11 refills | Status: DC
  Filled 2021-08-30: qty 6, 30d supply, fill #0

## 2021-10-19 ENCOUNTER — Ambulatory Visit (INDEPENDENT_AMBULATORY_CARE_PROVIDER_SITE_OTHER): Payer: Medicare Other | Admitting: Internal Medicine

## 2021-10-19 ENCOUNTER — Encounter: Payer: Self-pay | Admitting: Internal Medicine

## 2021-10-19 DIAGNOSIS — M79671 Pain in right foot: Secondary | ICD-10-CM | POA: Diagnosis not present

## 2021-10-19 MED ORDER — MECLIZINE HCL 25 MG PO TABS: 25.0000 mg | ORAL_TABLET | Freq: Three times a day (TID) | ORAL | 0 refills | Status: DC | PRN

## 2021-10-19 NOTE — Assessment & Plan Note (Signed)
Clearly seems mechanical Discussed new hiking shoes and arch supports Consider referral to Dr Lorelei Pont

## 2021-10-19 NOTE — Progress Notes (Signed)
Subjective:    Patient ID: Ernest Hudson, male    DOB: August 02, 1955, 66 y.o.   MRN: [id number]  HPI Here due to right foot pain  Not terrible today---but can be excruciating, when first putting foot down is worst Feels it from the front of the heel---all the way towards the toes Goes back 3 months Does walk a lot---often at [employer] in [employer] ---quite old Average 3 times a week Pain is not all the time when walking  Doesn't usually walk on concrete  Current Outpatient Medications on File Prior to Visit  Medication Sig Dispense Refill   amLODipine (NORVASC) 10 MG tablet TAKE 1 TABLET BY MOUTH ONCE DAILY 90 tablet 3   cetirizine (ZYRTEC) 10 MG tablet Take 10 mg by mouth every morning.     fluticasone (FLONASE) 50 MCG/ACT nasal spray Place 2 sprays into both nostrils daily as needed. For allergies. 16 g 6   lisinopril-hydrochlorothiazide (ZESTORETIC) 20-25 MG tablet TAKE 1 TABLET BY MOUTH DAILY 90 tablet 3   OVER THE COUNTER MEDICATION Take 100 mg by mouth daily. Diindolylmethane - DIM     rosuvastatin (CRESTOR) 10 MG tablet Take 1 tablet (10 mg total) by mouth daily. 90 tablet 3   sildenafil (VIAGRA) 100 MG tablet 1 tablet PO as directed PRN 30 tablet 11   No current facility-administered medications on file prior to visit.    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) 04/17/2013   Spontaneous pneumothorax 1995    Past  Surgical History:  Procedure Laterality Date   APPENDECTOMY  04/07/2013   Procedure: OPEN APPENDECTOMY;  Surgeon: Gayland Curry, MD;  Location: [place];  Service: General;;   cardiolyte stress  04/2008   CHOLECYSTECTOMY  2004   COLONOSCOPY  06/2005   ELBOW SURGERY Left 2001   "tendonitis"   EXTERNAL EAR [place] N/A 05/12/2014   Procedure: OPEN REPAIR OF INCISIONAL HERNIA REPAIR;  Surgeon: Greer Pickerel, MD;  Location[facility];  Service: General;  Laterality: N/A;   INGUINAL HERNIA REPAIR Left 05/31/2017   Procedure: LEFT INGUINAL HERNIA REPAIR WITH MESH;  Surgeon: Greer Pickerel, MD;  Location: [place];  Service: General;  Laterality: Left;   INSERTION OF MESH N/A 05/12/2014   Procedure: WITH INSERTION OF MESH;  Surgeon: Greer Pickerel, MD;  Location[facility];  Service: General;  Laterality: N/A;   INSERTION OF MESH Left 05/31/2017   Procedure: INSERTION OF MESH;  Surgeon: Greer Pickerel, MD;  Location: [place];  Service: General;  Laterality: Left;   KNEE ARTHROSCOPY Left 1990's   LAPAROSCOPIC APPENDECTOMY N/A 04/07/2013   Procedure: ATTEMPTED APPENDECTOMY LAPAROSCOPIC;  Surgeon: Gayland Curry, MD;  Location: [place];  Service: General;  Laterality: N/A;   LUNG SURGERY     "repaired ruptured bleb" '95   LYMPHADENECTOMY Bilateral 08/05/2020   Procedure: LYMPHADENECTOMY, PELVIC;  Surgeon: Raynelle Bring, MD;  [facility];  Service: Urology;  Laterality: Bilateral;   MIDDLE EAR SURGERY Left 1972   exploratory tympanotomy/notes 07/17/2000   NASAL SEPTUM SURGERY     PARTIAL COLECTOMY  04/07/2013   Procedure: PARTIAL COLECTOMY, ILEOCECTOMY;  Surgeon: Gayland Curry, MD;  Location: [place];  Service: General;;   ROBOT ASSISTED LAPAROSCOPIC RADICAL PROSTATECTOMY N/A 08/05/2020   Procedure: XI ROBOTIC ASSISTED LAPAROSCOPIC RADICAL PROSTATECTOMY LEVEL 3;  Surgeon: Raynelle Bring, MD;  [facility];  Service: Urology;   Laterality: N/A;  ONLY NEEDS 210 MIN   ROBOTIC ASSISTED LAPAROSCOPIC LYSIS OF ADHESION  08/05/2020   Procedure: XI ROBOTIC ASSISTED LAPAROSCOPIC LYSIS OF ADHESION;  Surgeon: Raynelle Bring, MD;  [facility];  Service: Urology;;   SKIN CANCER EXCISION  2000's   "chest"   TENDON REPAIR Left 1990's   "thumb"    Family History  Problem Relation Age of Onset   Healthy Sister    Lung disease Sister        has blebs on MRI,    Breast cancer Maternal Aunt        diagnosed in her 70s   Melanoma Maternal Uncle    Breast cancer Maternal Aunt        diagnosed in her 82s   Breast cancer Maternal Aunt        diagnosed in her 30s   Breast cancer Mother    Diabetes Mother    Cancer Mother        breast   Heart attack Father    Diabetes Father    Prostate cancer Father    Cancer Father        prostate   Healthy Brother    Prostate cancer Brother    Cancer Brother        prostate cancer   Healthy Brother    Cancer Cousin        multiple myeloma   Colon cancer Neg Hx     Social History   Socioeconomic History   Marital status: Married    Spouse name: Not on file   Number of children: 4   Years of education: Not on file   Highest education level: Not on file  Occupational History   Occupation: Psychologist, 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)    Comment: Retired  Tobacco Use   Smoking status: Former    Packs/day: 2.00    Years: 30.00    Total pack years: 60.00    Types: Cigarettes    Quit date: 07/30/2012    Years since quitting: 9.2   Smokeless tobacco: Never  Vaping Use   Vaping Use: Never used  Substance and Sexual Activity   Alcohol use: No    Alcohol/week: 0.0 standard drinks of alcohol   Drug use: No   Sexual activity: Not Currently  Other Topics Concern   Not on file  Social History Narrative   Married--1 daughter and 3 step daughters      No living will   Wife should be health care---alternate would be daughter--Melissa   Would accept resuscitation  attempts   No tube feeds if cognitively unaware   Social Determinants of Health   Financial Resource Strain: Not on file  Food Insecurity: Not on file  Transportation Needs: Not on file  Physical  Activity: Not on file  Stress: Not on file  Social Connections: Not on file  Intimate Partner Violence: Not on file   Review of Systems Chronic motion sickness---plans another deep sea fishing trip and wants some help OTC dramamine didn't help     Objective:   Physical Exam Constitutional:      Appearance: Normal appearance.  Musculoskeletal:     Comments: Well preserved arch No tenderness  Neurological:     Mental Status: He is alert.     Comments: Gait normal            Assessment & Plan:

## 2022-02-16 ENCOUNTER — Other Ambulatory Visit: Payer: Self-pay | Admitting: Internal Medicine

## 2022-02-17 ENCOUNTER — Ambulatory Visit (INDEPENDENT_AMBULATORY_CARE_PROVIDER_SITE_OTHER): Payer: Medicare Other | Admitting: Internal Medicine

## 2022-02-17 ENCOUNTER — Encounter: Payer: Self-pay | Admitting: Internal Medicine

## 2022-02-17 VITALS — BP 138/78 | HR 72 | Temp 99.0°F | Ht 70.0 in | Wt 216.0 lb

## 2022-02-17 DIAGNOSIS — Z23 Encounter for immunization: Secondary | ICD-10-CM

## 2022-02-17 DIAGNOSIS — Z0000 Encounter for general adult medical examination without abnormal findings: Secondary | ICD-10-CM

## 2022-02-17 DIAGNOSIS — F39 Unspecified mood [affective] disorder: Secondary | ICD-10-CM | POA: Diagnosis not present

## 2022-02-17 DIAGNOSIS — Z8546 Personal history of malignant neoplasm of prostate: Secondary | ICD-10-CM

## 2022-02-17 DIAGNOSIS — I10 Essential (primary) hypertension: Secondary | ICD-10-CM | POA: Diagnosis not present

## 2022-02-17 DIAGNOSIS — Q8789 Other specified congenital malformation syndromes, not elsewhere classified: Secondary | ICD-10-CM

## 2022-02-17 DIAGNOSIS — I700 Atherosclerosis of aorta: Secondary | ICD-10-CM

## 2022-02-17 LAB — LIPID PANEL
Cholesterol: 127 mg/dL (ref 0–200)
HDL: 44.4 mg/dL (ref >39.00)
LDL Cholesterol: 66 mg/dL (ref 0–99)
NonHDL: 82.39
Total CHOL/HDL Ratio: 3
Triglycerides: 80 mg/dL (ref 0.0–149.0)
VLDL: 16 mg/dL (ref 0.0–40.0)

## 2022-02-17 LAB — CBC
HCT: 43.8 % (ref 39.0–52.0)
Hemoglobin: 15.6 g/dL (ref 13.0–17.0)
MCHC: 35.6 g/dL (ref 30.0–36.0)
MCV: 86.9 fl (ref 78.0–100.0)
Platelets: 158 10*3/uL (ref 150.0–400.0)
RBC: 5.04 Mil/uL (ref 4.22–5.81)
RDW: 13.9 % (ref 11.5–15.5)
WBC: 3.8 10*3/uL — ABNORMAL LOW (ref 4.0–10.5)

## 2022-02-17 LAB — COMPREHENSIVE METABOLIC PANEL
ALT: 29 U/L (ref 0–53)
AST: 21 U/L (ref 0–37)
Albumin: 4.3 g/dL (ref 3.5–5.2)
Alkaline Phosphatase: 54 U/L (ref 39–117)
BUN: 16 mg/dL (ref 6–23)
CO2: 33 mEq/L — ABNORMAL HIGH (ref 19–32)
Calcium: 9 mg/dL (ref 8.4–10.5)
Chloride: 102 mEq/L (ref 96–112)
Creatinine, Ser: 1.06 mg/dL (ref 0.40–1.50)
GFR: 72.59 mL/min (ref >60.00)
Glucose, Bld: 84 mg/dL (ref 70–99)
Potassium: 3.5 mEq/L (ref 3.5–5.1)
Sodium: 143 mEq/L (ref 135–145)
Total Bilirubin: 0.8 mg/dL (ref 0.2–1.2)
Total Protein: 6.8 g/dL (ref 6.0–8.3)

## 2022-02-17 NOTE — Progress Notes (Signed)
Hearing Screening - Comments:: Passed whisper test Vision Screening - Comments:: March 2023

## 2022-02-17 NOTE — Assessment & Plan Note (Signed)
Will plan renal ultrasound again next year

## 2022-02-17 NOTE — Assessment & Plan Note (Signed)
On imaging Is on the rosuvastatin '10mg'$  daily

## 2022-02-17 NOTE — Assessment & Plan Note (Signed)
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

## 2022-02-17 NOTE — Assessment & Plan Note (Signed)
I have personally reviewed the Medicare Annual Wellness questionnaire and have noted 1. The patient's medical and social history 2. Their use of alcohol, tobacco or illicit drugs 3. Their current medications and supplements 4. The patient's functional ability including ADL's, fall risks, home safety risks and hearing or visual             impairment. 5. Diet and physical activities 6. Evidence for depression or mood disorders  The patients weight, height, BMI and visual acuity have been recorded in the chart I have made referrals, counseling and provided education to the patient based review of the above and I have provided the pt with a written personalized care plan for preventive services.  I have provided you with a copy of your personalized plan for preventive services. Please take the time to review along with your updated medication list.  Colon due 2027 Discussed improved lifestyle Flu vaccine today Still prefers no COVID vaccine Shingrix at the pharmacy

## 2022-02-17 NOTE — Assessment & Plan Note (Signed)
Last PSA 0

## 2022-02-17 NOTE — Progress Notes (Signed)
Subjective:    Patient ID: Ernest Hudson, male    DOB: 1955/07/01, 67 y.o.   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 Hearing okay on right--gone on left No alcohol or tobacco No falls Independent with instrumental ADLs  Has had cold---low grade fever recently Got something from the grandkids Lots of cough Wouldn't have come for evaluation ---is improving  Stress is better Still has 3 grandkids in the house and 2 in a camper on property Prostate cancer seems to be gone No depression lately Not anhedonic  Checks BP every once in a while Mildly up at urologist---okay otherwise No chest pain or SOB No dizziness or syncope No edema No palpitations Rare headaches  Still having some foot pain--despite inserts from [employer] (did help some)  No problems with cholesterol med Known aortic atherosclerosis  Current Outpatient Medications on File Prior to Visit  Medication Sig Dispense Refill   cetirizine (ZYRTEC) 10 MG tablet Take 10 mg by mouth every morning.     fluticasone (FLONASE) 50 MCG/ACT nasal spray Place 2 sprays into both nostrils daily as needed. For allergies. 16 g 6   rosuvastatin (CRESTOR) 10 MG tablet Take 1 tablet (10 mg total) by mouth daily. 90 tablet 3   tadalafil (CIALIS) 5 MG tablet Take 5 mg by mouth daily.     amLODipine (NORVASC) 10 MG tablet TAKE 1 TABLET BY MOUTH ONCE DAILY 90 tablet 3   lisinopril-hydrochlorothiazide (ZESTORETIC) 20-25 MG tablet TAKE 1 TABLET BY MOUTH DAILY 90 tablet 3   OVER THE COUNTER MEDICATION Take 100 mg by mouth daily. Diindolylmethane - DIM     No current facility-administered medications on file prior to  visit.    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"   History of blood transfusion ?1995   Hx of colonic polyps    Hyperlipidemia    Hypertension    Ileus, postoperative ([place]) 04/17/2013   Spontaneous pneumothorax 1995    Past Surgical History:  Procedure Laterality Date   APPENDECTOMY  04/07/2013   Procedure: OPEN APPENDECTOMY;  Surgeon: Gayland Curry, MD;  Location: [place];  Service: General;;   cardiolyte stress  04/2008   CHOLECYSTECTOMY  2004   COLONOSCOPY  06/2005   ELBOW SURGERY Left 2001   "tendonitis"   EXTERNAL EAR [place] N/A 05/12/2014   Procedure: OPEN REPAIR OF INCISIONAL HERNIA REPAIR;  Surgeon: Greer Pickerel, MD;  [facility];  Service: General;  Laterality: N/A;   INGUINAL HERNIA REPAIR Left 05/31/2017   Procedure: LEFT INGUINAL HERNIA REPAIR WITH MESH;  Surgeon: Greer Pickerel, MD;  Location: [place];  Service: General;  Laterality: Left;   INSERTION OF MESH N/A 05/12/2014   Procedure: WITH INSERTION OF MESH;  Surgeon: Greer Pickerel, MD;  [facility];  Service: General;  Laterality: N/A;   INSERTION OF MESH Left 05/31/2017   Procedure: INSERTION OF MESH;  Surgeon: Greer Pickerel, MD;  Location: [place];  Service: General;  Laterality: Left;   KNEE ARTHROSCOPY Left 1990's   LAPAROSCOPIC APPENDECTOMY N/A 04/07/2013   Procedure: ATTEMPTED APPENDECTOMY LAPAROSCOPIC;  Surgeon: Gayland Curry, MD;  Location: [place];  Service: General;  Laterality: N/A;   LUNG SURGERY     "repaired  ruptured bleb" '95   LYMPHADENECTOMY Bilateral 08/05/2020   Procedure: LYMPHADENECTOMY, PELVIC;  Surgeon: Raynelle Bring, MD;  [facility];  Service: Urology;  Laterality: Bilateral;   MIDDLE EAR SURGERY Left 1972   exploratory tympanotomy/notes 07/17/2000   NASAL SEPTUM SURGERY     PARTIAL COLECTOMY  04/07/2013   Procedure: PARTIAL COLECTOMY, ILEOCECTOMY;  Surgeon: Gayland Curry, MD;  Location: [place];  Service: General;;   ROBOT ASSISTED LAPAROSCOPIC RADICAL PROSTATECTOMY N/A 08/05/2020   Procedure: XI ROBOTIC ASSISTED LAPAROSCOPIC RADICAL PROSTATECTOMY LEVEL 3;  Surgeon: Raynelle Bring, MD;  [facility];  Service: Urology;  Laterality: N/A;  ONLY NEEDS 210 MIN   ROBOTIC ASSISTED LAPAROSCOPIC LYSIS OF ADHESION  08/05/2020   Procedure: XI ROBOTIC ASSISTED LAPAROSCOPIC LYSIS OF ADHESION;  Surgeon: Raynelle Bring, MD;  [facility];  Service: Urology;;   SKIN CANCER EXCISION  2000's   "chest"   TENDON REPAIR Left 1990's   "thumb"    Family History  Problem Relation Age of Onset   Healthy Sister    Lung disease Sister        has blebs on MRI,    Breast cancer Maternal Aunt        diagnosed in her 77s   Melanoma Maternal Uncle    Breast cancer Maternal Aunt        diagnosed in her 49s   Breast cancer Maternal Aunt        diagnosed in her 59s   Breast cancer Mother    Diabetes Mother    Cancer Mother        breast   Heart attack Father    Diabetes Father    Prostate cancer Father    Cancer Father        prostate   Healthy Brother    Prostate cancer Brother    Cancer Brother        prostate cancer   Healthy Brother    Cancer Cousin        multiple myeloma   Colon cancer Neg Hx     Social History   Socioeconomic History   Marital status: Married    Spouse name: Not on file   Number of children: 4   Years of education: Not on file   Highest education level: Not on file  Occupational History   Occupation: Psychologist, 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  Tobacco Use   Smoking status: Former    Packs/day: 2.00    Years: 30.00    Total pack years: 60.00    Types: Cigarettes    Quit date: 07/30/2012    Years since quitting: 9.5    Passive exposure: Never   Smokeless tobacco: Never  Vaping Use   Vaping Use: Never used  Substance and Sexual Activity   Alcohol use: No  Alcohol/week: 0.0 standard drinks of alcohol   Drug use: No   Sexual activity: Not Currently  Other Topics Concern   Not on file  Social History Narrative   Married--1 daughter and 3 step daughters      No living will   Wife should be health care---alternate would be daughter--Melissa   Would accept resuscitation attempts   No tube feeds if cognitively unaware   Social Determinants of Health   Financial Resource Strain: Not on file  Food Insecurity: Not on file  Transportation Needs: Not on file  Physical Activity: Not on file  Stress: Not on file  Social Connections: Not on file  Intimate Partner Violence: Not on file   Review of Systems Appetite is good Weight up some--tends to go up with holidays Sleeps okay Wears seat belt Needs crown--other teeth okay Still no success with viagra--but voids okay. Some incontinence--annoying No suspicious skin lesions Some heartburn last week--unusual. Mustard helped. No swallowing Bowels move fine--no blood No sig back or joint pains      Physical Exam Constitutional:      Appearance: Normal appearance.  HENT:     Mouth/Throat:     Comments: No lesions Eyes:     Conjunctiva/sclera: Conjunctivae normal.     Pupils: Pupils are equal, round, and reactive to light.  Cardiovascular:     Rate and Rhythm: Normal rate and regular rhythm.     Pulses: Normal pulses.     Heart sounds: No murmur heard.    No gallop.  Pulmonary:     Effort: Pulmonary effort is normal.     Breath sounds: Normal breath sounds. No wheezing or rales.  Abdominal:     Palpations: Abdomen is soft.     Tenderness:  There is no abdominal tenderness.  Musculoskeletal:     Cervical back: Neck supple.     Right lower leg: No edema.     Left lower leg: No edema.  Lymphadenopathy:     Cervical: No cervical adenopathy.  Skin:    Findings: No lesion or rash.  Neurological:     General: No focal deficit present.     Mental Status: He is alert and oriented to person, place, and time.     Comments: Mini-cog okay----clock normal, recall 2/3  Psychiatric:        Mood and Affect: Mood normal.        Behavior: Behavior normal.            Assessment & Plan:

## 2022-02-17 NOTE — Assessment & Plan Note (Signed)
BP Readings from Last 3 Encounters:  02/17/22 138/78  10/19/21 122/70  02/11/21 138/84   Good control on the lisinopril/HCTZ 20/25, amlodipine 10 Will check labs

## 2022-02-17 NOTE — Addendum Note (Signed)
Addended by: Pilar Grammes on: 02/17/2022 08:54 AM   Modules accepted: Orders

## 2022-02-21 ENCOUNTER — Encounter: Payer: Self-pay | Admitting: Internal Medicine

## 2022-02-22 MED ORDER — DOXYCYCLINE HYCLATE 100 MG PO TABS: 100.0000 mg | ORAL_TABLET | Freq: Two times a day (BID) | ORAL | 0 refills | Status: DC

## 2022-03-08 DIAGNOSIS — D229 Melanocytic nevi, unspecified: Secondary | ICD-10-CM | POA: Diagnosis not present

## 2022-03-08 DIAGNOSIS — Z86018 Personal history of other benign neoplasm: Secondary | ICD-10-CM | POA: Diagnosis not present

## 2022-03-08 DIAGNOSIS — L578 Other skin changes due to chronic exposure to nonionizing radiation: Secondary | ICD-10-CM | POA: Diagnosis not present

## 2022-03-08 DIAGNOSIS — L821 Other seborrheic keratosis: Secondary | ICD-10-CM | POA: Diagnosis not present

## 2022-03-08 DIAGNOSIS — L249 Irritant contact dermatitis, unspecified cause: Secondary | ICD-10-CM | POA: Diagnosis not present

## 2022-03-08 DIAGNOSIS — L820 Inflamed seborrheic keratosis: Secondary | ICD-10-CM | POA: Diagnosis not present

## 2022-03-08 DIAGNOSIS — L814 Other melanin hyperpigmentation: Secondary | ICD-10-CM | POA: Diagnosis not present

## 2022-03-08 DIAGNOSIS — D485 Neoplasm of uncertain behavior of skin: Secondary | ICD-10-CM | POA: Diagnosis not present

## 2022-03-08 DIAGNOSIS — D1801 Hemangioma of skin and subcutaneous tissue: Secondary | ICD-10-CM | POA: Diagnosis not present

## 2022-03-08 DIAGNOSIS — D225 Melanocytic nevi of trunk: Secondary | ICD-10-CM | POA: Diagnosis not present

## 2022-03-08 DIAGNOSIS — Z85828 Personal history of other malignant neoplasm of skin: Secondary | ICD-10-CM | POA: Diagnosis not present

## 2022-04-05 DIAGNOSIS — L700 Acne vulgaris: Secondary | ICD-10-CM | POA: Diagnosis not present

## 2022-04-05 DIAGNOSIS — L249 Irritant contact dermatitis, unspecified cause: Secondary | ICD-10-CM | POA: Diagnosis not present

## 2022-05-04 ENCOUNTER — Other Ambulatory Visit: Payer: Self-pay | Admitting: Internal Medicine

## 2022-05-09 DIAGNOSIS — Z0100 Encounter for examination of eyes and vision without abnormal findings: Secondary | ICD-10-CM | POA: Diagnosis not present

## 2022-05-09 DIAGNOSIS — H2513 Age-related nuclear cataract, bilateral: Secondary | ICD-10-CM | POA: Diagnosis not present

## 2022-05-09 DIAGNOSIS — H16223 Keratoconjunctivitis sicca, not specified as Sjogren's, bilateral: Secondary | ICD-10-CM | POA: Diagnosis not present

## 2022-07-21 DIAGNOSIS — C61 Malignant neoplasm of prostate: Secondary | ICD-10-CM | POA: Diagnosis not present

## 2022-08-02 DIAGNOSIS — N5201 Erectile dysfunction due to arterial insufficiency: Secondary | ICD-10-CM | POA: Diagnosis not present

## 2022-08-02 DIAGNOSIS — N393 Stress incontinence (female) (male): Secondary | ICD-10-CM | POA: Diagnosis not present

## 2022-08-02 DIAGNOSIS — C61 Malignant neoplasm of prostate: Secondary | ICD-10-CM | POA: Diagnosis not present

## 2022-08-20 ENCOUNTER — Other Ambulatory Visit: Payer: Self-pay

## 2022-08-20 ENCOUNTER — Encounter: Payer: Self-pay | Admitting: Emergency Medicine

## 2022-08-20 ENCOUNTER — Emergency Department
Admission: EM | Admit: 2022-08-20 | Discharge: 2022-08-20 | Disposition: A | Discharge status: Home / Self Care | Payer: Medicare HMO | Attending: Emergency Medicine | Admitting: Emergency Medicine

## 2022-08-20 ENCOUNTER — Emergency Department: Payer: Medicare HMO

## 2022-08-20 DIAGNOSIS — M549 Dorsalgia, unspecified: Secondary | ICD-10-CM

## 2022-08-20 DIAGNOSIS — M5459 Other low back pain: Secondary | ICD-10-CM | POA: Diagnosis not present

## 2022-08-20 DIAGNOSIS — R079 Chest pain, unspecified: Secondary | ICD-10-CM | POA: Diagnosis not present

## 2022-08-20 DIAGNOSIS — R9389 Abnormal findings on diagnostic imaging of other specified body structures: Secondary | ICD-10-CM | POA: Diagnosis not present

## 2022-08-20 DIAGNOSIS — J439 Emphysema, unspecified: Secondary | ICD-10-CM | POA: Diagnosis not present

## 2022-08-20 DIAGNOSIS — K4020 Bilateral inguinal hernia, without obstruction or gangrene, not specified as recurrent: Secondary | ICD-10-CM | POA: Diagnosis not present

## 2022-08-20 DIAGNOSIS — M546 Pain in thoracic spine: Secondary | ICD-10-CM | POA: Insufficient documentation

## 2022-08-20 DIAGNOSIS — N281 Cyst of kidney, acquired: Secondary | ICD-10-CM | POA: Diagnosis not present

## 2022-08-20 DIAGNOSIS — R109 Unspecified abdominal pain: Secondary | ICD-10-CM | POA: Diagnosis not present

## 2022-08-20 DIAGNOSIS — K439 Ventral hernia without obstruction or gangrene: Secondary | ICD-10-CM | POA: Diagnosis not present

## 2022-08-20 LAB — URINALYSIS, ROUTINE W REFLEX MICROSCOPIC
Bilirubin Urine: NEGATIVE
Glucose, UA: NEGATIVE mg/dL
Hgb urine dipstick: NEGATIVE
Ketones, ur: NEGATIVE mg/dL
Leukocytes,Ua: NEGATIVE
Nitrite: NEGATIVE
Protein, ur: NEGATIVE mg/dL
Specific Gravity, Urine: 1.018 (ref 1.005–1.030)
pH: 5 (ref 5.0–8.0)

## 2022-08-20 MED ORDER — BACLOFEN 10 MG PO TABS: 10.0000 mg | ORAL_TABLET | Freq: Three times a day (TID) | ORAL | 0 refills | Status: AC

## 2022-08-20 MED ORDER — KETOROLAC TROMETHAMINE 30 MG/ML IJ SOLN
30.0000 mg | Freq: Once | INTRAMUSCULAR | Status: AC
  Administered 2022-08-20: 30 mg via INTRAMUSCULAR
  Filled 2022-08-20: qty 1

## 2022-08-20 MED ORDER — OXYCODONE-ACETAMINOPHEN 5-325 MG PO TABS: 1.0000 | ORAL_TABLET | ORAL | 0 refills | Status: DC | PRN

## 2022-08-20 MED ORDER — CYCLOBENZAPRINE HCL 10 MG PO TABS
10.0000 mg | ORAL_TABLET | Freq: Once | ORAL | Status: AC
  Administered 2022-08-20: 10 mg via ORAL
  Filled 2022-08-20: qty 1

## 2022-08-20 MED ORDER — OXYCODONE-ACETAMINOPHEN 5-325 MG PO TABS
1.0000 | ORAL_TABLET | Freq: Once | ORAL | Status: AC
  Administered 2022-08-20: 1 via ORAL
  Filled 2022-08-20: qty 1

## 2022-08-20 NOTE — ED Notes (Signed)
See triage note  Presents with sudden onset of mid back pain  States pain started when he was getting a shower this am   Denies any trauma  n/v

## 2022-08-20 NOTE — ED Triage Notes (Signed)
Pt states coming in with mid back pain that started out of no where. Pt states no history of back pain and denies trauma or injury when the pain started. Pt denies any urinary symptoms as well.

## 2022-08-20 NOTE — ED Provider Notes (Signed)
[facility] Provider Note    Event Date/Time   First MD Initiated Contact with Patient 08/20/22 [phone]     (approximate)   History   Back Pain   HPI  Ernest Hudson is a 67 y.o. male with history of hypertension, hyperlipidemia and remote history of a kidney stone presents emergency department complaining of sudden onset of mid back pain that radiated around to both flanks.  States the pain was severe for about 20 minutes but now has subsided.  No fever or chills.  No vomiting.  Did not get sweaty.  Denies chest pain or shortness of breath.      Physical Exam   Triage Vital Signs: ED Triage Vitals [08/20/22 0935]  Enc Vitals Group     BP (!) 138/90     Pulse Rate (!) 52     Resp 18     Temp 98.2 F (36.8 C)     Temp src      SpO2 100 %     Weight 207 lb (93.9 kg)     Height 5\' 10"  (1.778 m)     Head Circumference      Peak Flow      Pain Score 7     Pain Loc      Pain Edu?      Excl. in GC?     Most recent vital signs: Vitals:   08/20/22 0935 08/20/22 1221  BP: (!) 138/90 130/80  Pulse: (!) 52 (!) 55  Resp: 18 18  Temp: 98.2 F (36.8 C)   SpO2: 100% 100%     General: Awake, no distress.   CV:  Good peripheral perfusion. regular rate and  rhythm Resp:  Normal effort.  Abd:  No distention.  Nontender, positive CVA tenderness on the left Other:      ED Results / Procedures / Treatments   Labs (all labs ordered are listed, but only abnormal results are displayed) Labs Reviewed  URINALYSIS, ROUTINE W REFLEX MICROSCOPIC - Abnormal; Notable for the following components:      Result Value   Color, Urine YELLOW (*)    APPearance CLEAR (*)    All other components within normal limits     EKG     RADIOLOGY CT renal stone cxr    PROCEDURES:   Procedures   MEDICATIONS ORDERED IN ED: Medications  oxyCODONE-acetaminophen (PERCOCET/ROXICET) 5-325 MG per tablet 1 tablet (1 tablet Oral Given 08/20/22 1034)  ketorolac  (TORADOL) 30 MG/ML injection 30 mg (30 mg Intramuscular Given 08/20/22 1205)  cyclobenzaprine (FLEXERIL) tablet 10 mg (10 mg Oral Given 08/20/22 1205)     IMPRESSION / MDM / ASSESSMENT AND PLAN / ED COURSE  I reviewed the triage vital signs and the nursing notes.                              Differential diagnosis includes, but is not limited to, kidney stone, pyelonephritis, muscle strain, AAA  Patient's presentation is most consistent with acute presentation with potential threat to life or bodily function.   CT renal stone study ordered, UA ordered  Patient be given Percocet by mouth.  Patient does not present with AAA symptoms/signs.  Abdomen is nontender.  Pain has gone away at this time.  He appears to be hemodynamically stable and blood pressure appears to be normal.   CT renal stone study independently reviewed interpreted by me as being  negative for any acute abnormality  Patient is feeling better with Percocet.  I did explain the CT findings.  However due to the past medical history of spontaneous pneumothorax we will do a chest x-ray to ensure this was not the pain that he felt earlier.  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  He feels that he is ready to go home.  Do not feel that he has any other acute abnormalities he has had relief with the medication and he has not had any more pain and vitals have remained stable.  He was given very strict instructions to return the emergency department if worsening.  He is in agreement treatment plan.  Discharged stable condition.   FINAL CLINICAL IMPRESSION(S) / ED DIAGNOSES   Final diagnoses:  Acute mid back pain     Rx / DC Orders   ED Discharge Orders          Ordered    oxyCODONE-acetaminophen (PERCOCET) 5-325 MG  tablet  Every 4 hours PRN        08/20/22 1159    baclofen (LIORESAL) 10 MG tablet  3 times daily        08/20/22 1159             Note:  This document was prepared using Dragon voice recognition software and may include unintentional dictation errors.    Faythe Ghee, PA-C 08/20/22 1456    Sharyn Creamer, MD 08/20/22 [phone]

## 2023-01-30 DIAGNOSIS — C61 Malignant neoplasm of prostate: Secondary | ICD-10-CM | POA: Diagnosis not present

## 2023-02-06 DIAGNOSIS — N5201 Erectile dysfunction due to arterial insufficiency: Secondary | ICD-10-CM | POA: Diagnosis not present

## 2023-02-06 DIAGNOSIS — N393 Stress incontinence (female) (male): Secondary | ICD-10-CM | POA: Diagnosis not present

## 2023-02-06 DIAGNOSIS — C61 Malignant neoplasm of prostate: Secondary | ICD-10-CM | POA: Diagnosis not present

## 2023-02-13 ENCOUNTER — Other Ambulatory Visit: Payer: Self-pay | Admitting: Internal Medicine

## 2023-02-22 ENCOUNTER — Encounter: Payer: Medicare HMO | Admitting: Internal Medicine

## 2023-02-26 DIAGNOSIS — N5201 Erectile dysfunction due to arterial insufficiency: Secondary | ICD-10-CM | POA: Diagnosis not present

## 2023-02-26 DIAGNOSIS — N393 Stress incontinence (female) (male): Secondary | ICD-10-CM | POA: Diagnosis not present

## 2023-03-02 ENCOUNTER — Ambulatory Visit (INDEPENDENT_AMBULATORY_CARE_PROVIDER_SITE_OTHER): Payer: No Typology Code available for payment source | Admitting: Internal Medicine

## 2023-03-02 ENCOUNTER — Encounter: Payer: Self-pay | Admitting: Internal Medicine

## 2023-03-02 VITALS — BP 134/88 | HR 60 | Temp 98.9°F | Ht 69.0 in | Wt 209.5 lb

## 2023-03-02 DIAGNOSIS — Z23 Encounter for immunization: Secondary | ICD-10-CM

## 2023-03-02 DIAGNOSIS — Z8546 Personal history of malignant neoplasm of prostate: Secondary | ICD-10-CM

## 2023-03-02 DIAGNOSIS — Z0000 Encounter for general adult medical examination without abnormal findings: Secondary | ICD-10-CM | POA: Diagnosis not present

## 2023-03-02 DIAGNOSIS — I700 Atherosclerosis of aorta: Secondary | ICD-10-CM | POA: Diagnosis not present

## 2023-03-02 DIAGNOSIS — Q8789 Other specified congenital malformation syndromes, not elsewhere classified: Secondary | ICD-10-CM

## 2023-03-02 DIAGNOSIS — I10 Essential (primary) hypertension: Secondary | ICD-10-CM

## 2023-03-02 LAB — COMPREHENSIVE METABOLIC PANEL
ALT: 27 U/L (ref 0–53)
AST: 17 U/L (ref 0–37)
Albumin: 4.8 g/dL (ref 3.5–5.2)
Alkaline Phosphatase: 71 U/L (ref 39–117)
BUN: 16 mg/dL (ref 6–23)
CO2: 34 meq/L — ABNORMAL HIGH (ref 19–32)
Calcium: 9.9 mg/dL (ref 8.4–10.5)
Chloride: 101 meq/L (ref 96–112)
Creatinine, Ser: 1.08 mg/dL (ref 0.40–1.50)
GFR: 70.47 mL/min (ref >60.00)
Glucose, Bld: 98 mg/dL (ref 70–99)
Potassium: 3.6 meq/L (ref 3.5–5.1)
Sodium: 143 meq/L (ref 135–145)
Total Bilirubin: 0.7 mg/dL (ref 0.2–1.2)
Total Protein: 7.7 g/dL (ref 6.0–8.3)

## 2023-03-02 LAB — CBC
HCT: 48.2 % (ref 39.0–52.0)
Hemoglobin: 16.6 g/dL (ref 13.0–17.0)
MCHC: 34.5 g/dL (ref 30.0–36.0)
MCV: 87.7 fL (ref 78.0–100.0)
Platelets: 229 10*3/uL (ref 150.0–400.0)
RBC: 5.49 Mil/uL (ref 4.22–5.81)
RDW: 14.1 % (ref 11.5–15.5)
WBC: 5.9 10*3/uL (ref 4.0–10.5)

## 2023-03-02 LAB — LIPID PANEL
Cholesterol: 139 mg/dL (ref 0–200)
HDL: 43.9 mg/dL (ref >39.00)
LDL Cholesterol: 82 mg/dL (ref 0–99)
NonHDL: 95.02
Total CHOL/HDL Ratio: 3
Triglycerides: 64 mg/dL (ref 0.0–149.0)
VLDL: 12.8 mg/dL (ref 0.0–40.0)

## 2023-03-02 NOTE — Assessment & Plan Note (Signed)
 On imaging Is on rosuvastatin 10mg  dialy

## 2023-03-02 NOTE — Assessment & Plan Note (Signed)
 I have personally reviewed the Medicare Annual Wellness questionnaire and have noted 1. The patient's medical and social history 2. Their use of alcohol, tobacco or illicit drugs 3. Their current medications and supplements 4. The patient's functional ability including ADL's, fall risks, home safety risks and hearing or visual             impairment. 5. Diet and physical activities 6. Evidence for depression or mood disorders  The patients weight, height, BMI and visual acuity have been recorded in the chart I have made referrals, counseling and provided education to the patient based review of the above and I have provided the pt with a written personalized care plan for preventive services.  I have provided you with a copy of your personalized plan for preventive services. Please take the time to review along with your updated medication list.  Colonoscopy due 2027 Still prefers no COVID vaccine Flu vaccine today Td at pharmacy Plan prevnar 20 next year

## 2023-03-02 NOTE — Assessment & Plan Note (Signed)
 Did have imaging of kidneys in July--no tumors

## 2023-03-02 NOTE — Addendum Note (Signed)
 Addended by: Shon Millet on: 03/02/2023 01:26 PM   Modules accepted: Orders

## 2023-03-02 NOTE — Assessment & Plan Note (Signed)
 BP Readings from Last 3 Encounters:  03/02/23 134/88  08/20/22 130/80  02/17/22 138/78   Controlled with lisinopril/hydrochlorothiazide 20/25 and amlodipine 10mg  daily

## 2023-03-02 NOTE — Progress Notes (Signed)
 Subjective:    Patient ID: Ernest Hudson, male    DOB: Feb 10, 1956, 68 y.o.   MRN: [id number]  HPI Here for Medicare wellness visit and follow up of chronic health conditions Reviewed advanced directives Reviewed other doctors----Dr Borden--urology, [facility], Dr Justin, Dr Celene No hospitalizations or surgery in the past year No tobacco or alcohol Walks and hikes. Bikes at the beach. 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 Plan audiology evaluation No falls Mild mood issues Independent with instrumental ADLs Mild memory issues--may repeat himself per wife. Can lose his train of thought. No functional changes  Recent death of daughter---Christmas Her kids still live in camper in driveway with their dad 2 cousins also died (older)  No chest pain or SOB No dizziness or syncope No palpitations No edema No headaches  No problems with statin Known aortic atherosclerosis  PSA still down ---no treatment Still has persistent urinary leakage--getting testing to consider sling  Current Outpatient Medications on File Prior to Visit  Medication Sig Dispense Refill   amLODipine  (NORVASC ) 10 MG tablet TAKE ONE TABLET BY MOUTH DAILY 90 tablet 3   cetirizine  (ZYRTEC ) 10 MG tablet Take 10 mg by mouth every morning.     fluticasone  (FLONASE ) 50 MCG/ACT nasal spray Place 2 sprays into both nostrils daily as needed. For allergies. 16 g 6   lisinopril -hydrochlorothiazide  (ZESTORETIC ) 20-25 MG tablet TAKE ONE TABLET BY MOUTH DAILY 90 tablet 3   rosuvastatin  (CRESTOR ) 10 MG tablet TAKE 1 TABLET BY MOUTH DAILY 90 tablet 3   tadalafil (CIALIS) 5 MG tablet Take 5 mg by mouth daily.     No current facility-administered medications on file prior to visit.    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   History of blood transfusion ?1995   Hx of colonic polyps    Hyperlipidemia    Hypertension    Ileus, postoperative (HCC) 04/17/2013   Spontaneous pneumothorax 1995    Past Surgical History:  Procedure Laterality Date   APPENDECTOMY  04/07/2013   Procedure: OPEN APPENDECTOMY;  Surgeon: Camellia CHRISTELLA Blush, MD;  Location: [facility] OR;  Service: General;;   cardiolyte stress  04/2008   CHOLECYSTECTOMY  2004   COLONOSCOPY  06/2005   ELBOW SURGERY Left 2001   tendonitis   EXTERNAL EAR SURGERY  1971   HERNIA REPAIR     INCISIONAL HERNIA REPAIR N/A 05/12/2014   Procedure: OPEN REPAIR OF INCISIONAL HERNIA REPAIR;  Surgeon: Camellia Blush, MD;  [facility];  Service: General;  Laterality: N/A;   INGUINAL HERNIA REPAIR Left 05/31/2017   Procedure: LEFT INGUINAL HERNIA REPAIR WITH MESH;  Surgeon: Blush Camellia, MD;  Location: [facility];  Service: General;  Laterality: Left;   INSERTION OF MESH N/A 05/12/2014   Procedure: WITH INSERTION OF MESH;  Surgeon: Camellia Blush, MD;  [facility];  Service: General;  Laterality:  N/A;   INSERTION OF MESH Left 05/31/2017   Procedure: INSERTION OF MESH;  Surgeon: Tanda Locus, MD;  Location: [facility];  Service: General;  Laterality: Left;   KNEE ARTHROSCOPY Left 1990's   LAPAROSCOPIC APPENDECTOMY N/A 04/07/2013   Procedure: ATTEMPTED APPENDECTOMY LAPAROSCOPIC;  Surgeon: Locus CHRISTELLA Tanda, MD;  Location: [facility] OR;  Service: General;  Laterality: N/A;   LUNG SURGERY     repaired ruptured bleb '95   LYMPHADENECTOMY Bilateral 08/05/2020   Procedure: LYMPHADENECTOMY, PELVIC;  Surgeon: Renda Glance, MD;  [facility];   Service: Urology;  Laterality: Bilateral;   MIDDLE EAR SURGERY Left 1972   exploratory tympanotomy/notes 07/17/2000   NASAL SEPTUM SURGERY     PARTIAL COLECTOMY  04/07/2013   Procedure: PARTIAL COLECTOMY, ILEOCECTOMY;  Surgeon: Locus CHRISTELLA Tanda, MD;  Location: [facility] OR;  Service: General;;   ROBOT ASSISTED LAPAROSCOPIC RADICAL PROSTATECTOMY N/A 08/05/2020   Procedure: XI ROBOTIC ASSISTED LAPAROSCOPIC RADICAL PROSTATECTOMY LEVEL 3;  Surgeon: Renda Glance, MD;  [facility];  Service: Urology;  Laterality: N/A;  ONLY NEEDS 210 MIN   ROBOTIC ASSISTED LAPAROSCOPIC LYSIS OF ADHESION  08/05/2020   Procedure: XI ROBOTIC ASSISTED LAPAROSCOPIC LYSIS OF ADHESION;  Surgeon: Renda Glance, MD;  [facility];  Service: Urology;;   SKIN CANCER EXCISION  2000's   chest   TENDON REPAIR Left 1990's   thumb    Family History  Problem Relation Age of Onset   Breast cancer Mother    Diabetes Mother    Cancer Mother        breast   Heart attack Father    Diabetes Father    Prostate cancer Father    Cancer Father        prostate   Healthy Sister    Lung disease Sister        has blebs on MRI,    Healthy Brother    Prostate cancer Brother    Cancer Brother        prostate cancer   Healthy Brother    Breast cancer Daughter    Breast cancer Maternal Aunt        diagnosed in her 19s   Breast cancer Maternal Aunt        diagnosed in her 56s   Breast cancer Maternal Aunt        diagnosed in her 31s   Melanoma Maternal Uncle    Cancer Cousin        multiple myeloma   Colon cancer Neg Hx     Social History   Socioeconomic History   Marital status: Married    Spouse name: Not on file   Number of children: 4   Years of education: Not on file   Highest education level: Not on file  Occupational History   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)    Comment: Retired  Tobacco Use   Smoking status: Former    Current packs/day: 0.00    Average packs/day: 2.0 packs/day  for 30.0 years (60.0 ttl pk-yrs)    Types: Cigarettes    Start date: 07/31/1982    Quit date: 07/30/2012    Years since quitting: 10.5    Passive exposure: Never   Smokeless tobacco: Never  Vaping Use   Vaping status: Never Used  Substance and Sexual Activity   Alcohol use: No    Alcohol/week: 0.0 standard drinks of alcohol   Drug use: No   Sexual activity: Not Currently  Other Topics Concern   Not on file  Social History Narrative   Married--1 daughter and 3 step daughters      No living will   Wife should be health care---alternate would be daughter--Melissa   Would accept resuscitation attempts   No tube feeds if cognitively unaware   Social Drivers of Health   Financial Resource Strain: Not on file  Food Insecurity: Not on file  Transportation Needs: Not on file  Physical Activity: Not on file  Stress: Not on file  Social Connections: Not on file  Intimate Partner Violence: Not on file   Review of Systems Appetite is okay Weight is stable Sleeps okay Wears seat belt Teeth okay--keeps up with dentist No suspicious skin lesions Some heartburn--but not persistent. Tums helps. No dysphagia Bowels move fine--no blood No sig joint pains. Back pain in AM---better when he starts moving around     Objective:   Physical Exam Constitutional:      Appearance: Normal appearance.  HENT:     Mouth/Throat:     Pharynx: No oropharyngeal exudate or posterior oropharyngeal erythema.  Eyes:     Conjunctiva/sclera: Conjunctivae normal.     Pupils: Pupils are equal, round, and reactive to light.  Cardiovascular:     Rate and Rhythm: Normal rate and regular rhythm.     Pulses: Normal pulses.     Heart sounds: No murmur heard.    No gallop.  Pulmonary:     Effort: Pulmonary effort is normal.     Breath sounds: Normal breath sounds. No wheezing or rales.  Abdominal:     Palpations: Abdomen is soft.     Tenderness: There is no abdominal tenderness.  Musculoskeletal:      Cervical back: Neck supple.     Right lower leg: No edema.     Left lower leg: No edema.  Lymphadenopathy:     Cervical: No cervical adenopathy.  Skin:    Findings: No lesion or rash.  Neurological:     General: No focal deficit present.     Mental Status: He is alert and oriented to person, place, and time.     Comments: Mini-cog--normal  Psychiatric:        Mood and Affect: Mood normal.        Behavior: Behavior normal.            Assessment & Plan:

## 2023-03-02 NOTE — Assessment & Plan Note (Signed)
 Still in remission--no Rx

## 2023-03-06 DIAGNOSIS — R350 Frequency of micturition: Secondary | ICD-10-CM | POA: Diagnosis not present

## 2023-03-06 DIAGNOSIS — N393 Stress incontinence (female) (male): Secondary | ICD-10-CM | POA: Diagnosis not present

## 2023-03-06 DIAGNOSIS — R351 Nocturia: Secondary | ICD-10-CM | POA: Diagnosis not present

## 2023-03-12 DIAGNOSIS — D1801 Hemangioma of skin and subcutaneous tissue: Secondary | ICD-10-CM | POA: Diagnosis not present

## 2023-03-12 DIAGNOSIS — D229 Melanocytic nevi, unspecified: Secondary | ICD-10-CM | POA: Diagnosis not present

## 2023-03-12 DIAGNOSIS — L814 Other melanin hyperpigmentation: Secondary | ICD-10-CM | POA: Diagnosis not present

## 2023-03-12 DIAGNOSIS — Z86018 Personal history of other benign neoplasm: Secondary | ICD-10-CM | POA: Diagnosis not present

## 2023-03-12 DIAGNOSIS — L578 Other skin changes due to chronic exposure to nonionizing radiation: Secondary | ICD-10-CM | POA: Diagnosis not present

## 2023-03-12 DIAGNOSIS — L821 Other seborrheic keratosis: Secondary | ICD-10-CM | POA: Diagnosis not present

## 2023-03-12 DIAGNOSIS — Z85828 Personal history of other malignant neoplasm of skin: Secondary | ICD-10-CM | POA: Diagnosis not present

## 2023-03-12 DIAGNOSIS — L570 Actinic keratosis: Secondary | ICD-10-CM | POA: Diagnosis not present

## 2023-03-12 DIAGNOSIS — L820 Inflamed seborrheic keratosis: Secondary | ICD-10-CM | POA: Diagnosis not present

## 2023-03-29 DIAGNOSIS — N393 Stress incontinence (female) (male): Secondary | ICD-10-CM | POA: Diagnosis not present

## 2023-04-13 ENCOUNTER — Other Ambulatory Visit: Payer: Self-pay | Admitting: Urology

## 2023-04-18 NOTE — Progress Notes (Signed)
 COVID Vaccine received:  [x]  No []  Yes Date of any COVID positive Test in last 90 days: none  PCP - Tillman Abide, MD  Cardiologist - none  Chest x-ray -  EKG - 08-02-2020    Will repeat  Stress Test - Myoview 04-23-2008  Epic ECHO -  Cardiac Cath -   PCR screen: []  Ordered & Completed []   No Order but Needs PROFEND     [x]   N/A for this surgery  Surgery Plan:  [x]  Ambulatory   []  Outpatient in bed  []  Admit Anesthesia:    [x]  General  []  Spinal  []   Choice []   MAC  Bowel Prep - [x]  No  []   Yes ______  Pacemaker / ICD device [x]  No []  Yes   Spinal Cord Stimulator:[x]  No []  Yes       History of Sleep Apnea? [x]  No []  Yes   CPAP used?- [x]  No []  Yes    Does the patient monitor blood sugar?   [x]  N/A   []  No []  Yes  Patient has: [x]  NO Hx DM   []  Pre-DM   []  DM1  []   DM2  Blood Thinner / Instructions:  none Aspirin Instructions:  none  ERAS Protocol Ordered: [x]  No  []  Yes Patient is to be NPO after: midnight prior  Dental hx: []  Dentures:  [x]  N/A      []  Bridge or Partial:                   []  Loose or Damaged teeth:   Activity level: Patient is able  to climb a flight of stairs without difficulty; [x]  No CP  [x]  No SOB.  Patient can perform ADLs without assistance.   Anesthesia review: HTN, Hx Bullous emphysema- s/p repair ruptured bleb (1995), Birt- Hogg- Dube' syndrome (Lungs, kidneys, skin abnormality). Bradycardia, HOH-  no HAs,   Patient denies shortness of breath, fever, cough and chest pain at PAT appointment.  Patient verbalized understanding and agreement to the Pre-Surgical Instructions that were given to them at this PAT appointment. Patient was also educated of the need to review these PAT instructions again prior to his surgery.I reviewed the appropriate phone numbers to call if they have any and questions or concerns.

## 2023-04-18 NOTE — Patient Instructions (Signed)
 SURGICAL WAITING ROOM VISITATION Patients having surgery or a procedure may have no more than 2 support people in the waiting area - these visitors may rotate in the visitor waiting room.   Due to an increase in RSV and influenza rates and associated hospitalizations, children ages 93 and under may not visit patients in [facility] hospitals. If the patient needs to stay at the hospital during part of their recovery, the visitor guidelines for inpatient rooms apply.  PRE-OP VISITATION  Pre-op nurse will coordinate an appropriate time for 1 support person to accompany the patient in pre-op.  This support person may not rotate.  This visitor will be contacted when the time is appropriate for the visitor to come back in the pre-op area.  Please refer to the [facility] website for the visitor guidelines for Inpatients (after your surgery is over and you are in a regular room).  You are not required to quarantine at this time prior to your surgery. However, you must do this: Hand Hygiene often Do NOT share personal items Notify your provider if you are in close contact with someone who has COVID or you develop fever 100.4 or greater, new onset of sneezing, cough, sore throat, shortness of breath or body aches.  If you test positive for Covid or have been in contact with anyone that has tested positive in the last 10 days please notify you surgeon.    Your procedure is scheduled on:  Tuesday   April 24, 2023  Report to [facility] Main Entrance: [address] entrance where the [employer] is available.   Report to admitting at:  06:45   AM  Call this number if you have any questions or problems the morning of surgery [phone]  DO NOT EAT OR DRINK ANYTHING AFTER MIDNIGHT THE NIGHT PRIOR TO YOUR SURGERY / PROCEDURE.   FOLLOW  ANY ADDITIONAL PRE OP INSTRUCTIONS YOU RECEIVED FROM YOUR SURGEON'S OFFICE!!!   Oral Hygiene is also important to reduce your risk of infection.        Remember  - BRUSH YOUR TEETH THE MORNING OF SURGERY WITH YOUR REGULAR TOOTHPASTE  Do NOT smoke after Midnight the night before surgery.   STOP TAKING all Vitamins, Herbs and supplements 1 week before your surgery.   Take ONLY these medicines the morning of surgery with A SIP OF WATER: amlodipine, cetirizine (Zyrtec)                    You may not have any metal on your body including  jewelry, and body piercing  Do not wear  lotions, powders, cologne, or deodorant  Men may shave face and neck.  Contacts, Hearing Aids, dentures or bridgework may not be worn into surgery. DENTURES WILL BE REMOVED PRIOR TO SURGERY PLEASE DO NOT APPLY "Poly grip" OR ADHESIVES!!!  Patients discharged on the day of surgery will not be allowed to drive home.  Someone NEEDS to stay with you for the first 24 hours after anesthesia.  Do not bring your home medications to the hospital. The Pharmacy will dispense medications listed on your medication list to you during your admission in the Hospital.  Please read over the following fact sheets you were given: IF YOU HAVE QUESTIONS ABOUT YOUR PRE-OP INSTRUCTIONS, PLEASE CALL [phone].   [facility] - Preparing for Surgery Before surgery, you can play an important role.  Because skin is not sterile, your skin needs to be as free of germs as possible.  You can reduce the number of germs on your skin by washing with CHG (chlorahexidine gluconate) soap before surgery.  CHG is an antiseptic cleaner which kills germs and bonds with the skin to continue killing germs even after washing. Please DO NOT use if you have an allergy to CHG or antibacterial soaps.  If your skin becomes reddened/irritated stop using the CHG and inform your nurse when you arrive at Short Stay. Do not shave (including legs and underarms) for at least 48 hours prior to the first CHG shower.  You may shave your face/neck.  Please follow these instructions carefully:  1.  Shower with CHG Soap the night  before surgery and the  morning of surgery.  2.  If you choose to wash your hair, wash your hair first as usual with your normal  shampoo.  3.  After you shampoo, rinse your hair and body thoroughly to remove the shampoo.                             4.  Use CHG as you would any other liquid soap.  You can apply chg directly to the skin and wash.  Gently with a scrungie or clean washcloth.  5.  Apply the CHG Soap to your body ONLY FROM THE NECK DOWN.   Do not use on face/ open                           Wound or open sores. Avoid contact with eyes, ears mouth and genitals (private parts).                       Wash face,  Genitals (private parts) with your normal soap.             6.  Wash thoroughly, paying special attention to the area where your  surgery  will be performed.  7.  Thoroughly rinse your body with warm water from the neck down.  8.  DO NOT shower/wash with your normal soap after using and rinsing off the CHG Soap.            9.  Pat yourself dry with a clean towel.            10.  Wear clean pajamas.            11.  Place clean sheets on your bed the night of your first shower and do not  sleep with pets.  ON THE DAY OF SURGERY : Do not apply any lotions/deodorants the morning of surgery.  Please wear clean clothes to the hospital/surgery center.    FAILURE TO FOLLOW THESE INSTRUCTIONS MAY RESULT IN THE CANCELLATION OF YOUR SURGERY  PATIENT SIGNATURE_________________________________  NURSE SIGNATURE__________________________________  ________________________________________________________________________

## 2023-04-19 ENCOUNTER — Other Ambulatory Visit: Payer: Self-pay

## 2023-04-19 ENCOUNTER — Encounter (HOSPITAL_COMMUNITY)
Admission: RE | Admit: 2023-04-19 | Discharge: 2023-04-19 | Disposition: A | Discharge status: Home / Self Care | Payer: No Typology Code available for payment source | Source: Ambulatory Visit | Attending: Urology | Admitting: Urology

## 2023-04-19 ENCOUNTER — Encounter (HOSPITAL_COMMUNITY): Payer: Self-pay

## 2023-04-19 ENCOUNTER — Telehealth: Payer: Self-pay

## 2023-04-19 VITALS — BP 151/75 | HR 56 | Temp 98.2°F | Resp 14 | Ht 70.0 in | Wt 210.0 lb

## 2023-04-19 DIAGNOSIS — Z87891 Personal history of nicotine dependence: Secondary | ICD-10-CM | POA: Diagnosis not present

## 2023-04-19 DIAGNOSIS — N393 Stress incontinence (female) (male): Secondary | ICD-10-CM | POA: Diagnosis not present

## 2023-04-19 DIAGNOSIS — Q8789 Other specified congenital malformation syndromes, not elsewhere classified: Secondary | ICD-10-CM | POA: Insufficient documentation

## 2023-04-19 DIAGNOSIS — J439 Emphysema, unspecified: Secondary | ICD-10-CM | POA: Insufficient documentation

## 2023-04-19 DIAGNOSIS — Z01818 Encounter for other preprocedural examination: Secondary | ICD-10-CM | POA: Diagnosis not present

## 2023-04-19 DIAGNOSIS — I10 Essential (primary) hypertension: Secondary | ICD-10-CM | POA: Diagnosis not present

## 2023-04-19 HISTORY — DX: Pneumonia, unspecified organism: J18.9

## 2023-04-19 HISTORY — DX: Squamous cell carcinoma of skin of scalp and neck: C44.42

## 2023-04-19 HISTORY — DX: Cardiac arrhythmia, unspecified: I49.9

## 2023-04-19 HISTORY — DX: Personal history of urinary calculi: Z87.442

## 2023-04-19 LAB — CBC
HCT: 49.1 % (ref 39.0–52.0)
Hemoglobin: 16.6 g/dL (ref 13.0–17.0)
MCH: 30 pg (ref 26.0–34.0)
MCHC: 33.8 g/dL (ref 30.0–36.0)
MCV: 88.8 fL (ref 80.0–100.0)
Platelets: 194 10*3/uL (ref 150–400)
RBC: 5.53 MIL/uL (ref 4.22–5.81)
RDW: 13.2 % (ref 11.5–15.5)
WBC: 5.8 10*3/uL (ref 4.0–10.5)
nRBC: 0 % (ref 0.0–0.2)

## 2023-04-19 LAB — BASIC METABOLIC PANEL
Anion gap: 10 (ref 5–15)
BUN: 17 mg/dL (ref 8–23)
CO2: 27 mmol/L (ref 22–32)
Calcium: 9.3 mg/dL (ref 8.9–10.3)
Chloride: 101 mmol/L (ref 98–111)
Creatinine, Ser: 1.14 mg/dL (ref 0.61–1.24)
GFR, Estimated: >60 mL/min (ref >60)
Glucose, Bld: 100 mg/dL — ABNORMAL HIGH (ref 70–99)
Potassium: 3.6 mmol/L (ref 3.5–5.1)
Sodium: 138 mmol/L (ref 135–145)

## 2023-04-19 NOTE — Telephone Encounter (Signed)
 Left message on VM for Ernest Hudson advising I faxed that back either yesterday or the day before and received a fax confirmation. I sent it to the front to be scanned in. But I specifically remember seeing this.

## 2023-04-19 NOTE — Telephone Encounter (Signed)
 Copied from CRM [id number]. Topic: General - Other >> Apr 17, 2023 11:39 AM Adele Barthel wrote: Reason for CRM:   Amani from [facility] is calling regarding the pre surgery clearance form she faxed over on 02/21. Reviewed chart and Media tab, was not able to find any forms received recently from their office. Contacted [facility]; however, no one answered.   Amani requests call back to verify if clinic received the fax and an estimated time period of when she would receive the form back. Patient is scheduled for surgery on 04/24/2023  CB# [phone], ext [phone]. Can leave a message with details if no answer, busy in clinic today with patients.

## 2023-04-20 LAB — URINE CULTURE: Culture: NO GROWTH

## 2023-04-23 MED ORDER — GENTAMICIN SULFATE 40 MG/ML IJ SOLN
5.0000 mg/kg | INTRAVENOUS | Status: AC
  Administered 2023-04-24: 410 mg via INTRAVENOUS
  Filled 2023-04-23: qty 10.25

## 2023-04-23 NOTE — Progress Notes (Signed)
 Anesthesia Chart Review   Case: [id number] Date/Time: 04/24/23 0845   Procedure: MALE SLING - 90 MINUTES NEEDED FOR CASE   Anesthesia type: General   Pre-op diagnosis: STRESS INCONTINENCE   [facility]   Surgeons: Despina Arias, MD       DISCUSSION:68 y.o. former smoker with h/o HTN, chronic bullous emphysema, Birt-Hogg-Dube syndrome, stress incontinence scheduled for above procedure 04/24/2023 with Dr. Traci Sermon.   Chest Xray 08/20/2022 with chronic bullous emphysema, no acute findings.   Follows with PCP, last seen 03/02/2023. Per OV note pt very active, "Walks and hikes. Bikes at the beach. 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  No shortness of breath.   VS: BP (!) 151/75 Comment: right arm sitting  Pulse (!) 56   Temp 36.8 C (Oral)   Resp 14   Ht 5\' 10"  (1.778 m)   Wt 95.3 kg   SpO2 97%   BMI 30.13 kg/m   PROVIDERS: Karie Schwalbe, MD is PCP    LABS: Labs reviewed: Acceptable for surgery. (all labs ordered are listed, but only abnormal results are displayed)  Labs Reviewed  BASIC METABOLIC PANEL - Abnormal; Notable for the following components:      Result Value   Glucose, Bld 100 (*)    All other components within normal limits  URINE CULTURE  CBC     IMAGES:   EKG:   CV:  Past Medical History:  Diagnosis Date   Adrenal mass (HCC)    "benign" (04/08/2013   Allergic rhinitis    Arthritis    "probably in my fingers" (04/08/2013)   Basal cell carcinoma 2000's   'chest"   Birt-Hogg-Dube syndrome    Bradycardia    Dyslipidemia (high LDL; low HDL)    Dysrhythmia    bradycardia   Hearing loss in left ear    "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"   History of blood transfusion ?1995   History of kidney stones    Hx of colonic polyps    Hyperlipidemia    Hypertension    Ileus, postoperative (HCC) 04/17/2013   Pneumonia    Spontaneous pneumothorax 1995   Squamous cell carcinoma of head and neck     Past Surgical History:   Procedure Laterality Date   APPENDECTOMY  04/07/2013   Procedure: OPEN APPENDECTOMY;  Surgeon: Atilano Ina, MD;  Location: [facility] OR;  Service: General;;   cardiolyte stress  04/2008   CHOLECYSTECTOMY  2004   COLONOSCOPY  06/2005   ELBOW SURGERY Left 2001   "tendonitis"   EXTERNAL EAR SURGERY Left 1971   HERNIA REPAIR     INCISIONAL HERNIA REPAIR N/A 05/12/2014   Procedure: OPEN REPAIR OF INCISIONAL HERNIA REPAIR;  Surgeon: Gaynelle Adu, MD;  [facility];  Service: General;  Laterality: N/A;   INGUINAL HERNIA REPAIR Left 05/31/2017   Procedure: LEFT INGUINAL HERNIA REPAIR WITH MESH;  Surgeon: Gaynelle Adu, MD;  Location: [facility];  Service: General;  Laterality: Left;   INSERTION OF MESH N/A 05/12/2014   Procedure: WITH INSERTION OF MESH;  Surgeon: Gaynelle Adu, MD;  [facility];  Service: General;  Laterality: N/A;   INSERTION OF MESH Left 05/31/2017   Procedure: INSERTION OF MESH;  Surgeon: Gaynelle Adu, MD;  Location: [facility];  Service: General;  Laterality: Left;   KNEE ARTHROSCOPY Left 1990's   LAPAROSCOPIC APPENDECTOMY N/A 04/07/2013   Procedure: ATTEMPTED APPENDECTOMY LAPAROSCOPIC;  Surgeon: Mary Sella  Andrey Campanile, MD;  Location: [facility] OR;  Service: General;  Laterality: N/A;   LUNG SURGERY     "repaired ruptured bleb" '95   LYMPHADENECTOMY Bilateral 08/05/2020   Procedure: LYMPHADENECTOMY, PELVIC;  Surgeon: Heloise Purpura, MD;  [facility];  Service: Urology;  Laterality: Bilateral;   MIDDLE EAR SURGERY Left 1972   exploratory tympanotomy/notes 07/17/2000   NASAL SEPTUM SURGERY     PARTIAL COLECTOMY  04/07/2013   Procedure: PARTIAL COLECTOMY, ILEOCECTOMY;  Surgeon: Atilano Ina, MD;  Location: [facility];  Service: General;;   ROBOT ASSISTED LAPAROSCOPIC RADICAL PROSTATECTOMY N/A 08/05/2020   Procedure: XI ROBOTIC ASSISTED LAPAROSCOPIC RADICAL PROSTATECTOMY LEVEL 3;  Surgeon: Heloise Purpura, MD;  [facility];  Service: Urology;  Laterality:  N/A;  ONLY NEEDS 210 MIN   ROBOTIC ASSISTED LAPAROSCOPIC LYSIS OF ADHESION  08/05/2020   Procedure: XI ROBOTIC ASSISTED LAPAROSCOPIC LYSIS OF ADHESION;  Surgeon: Heloise Purpura, MD;  [facility];  Service: Urology;;   SKIN CANCER EXCISION  2000's   "chest"   TENDON REPAIR Left 1990's   "thumb"    MEDICATIONS:  amLODipine (NORVASC) 10 MG tablet   cetirizine (ZYRTEC) 10 MG tablet   fluticasone (FLONASE) 50 MCG/ACT nasal spray   lisinopril-hydrochlorothiazide (ZESTORETIC) 20-25 MG tablet   rosuvastatin (CRESTOR) 10 MG tablet   No current facility-administered medications for this encounter.    [START ON 04/24/2023] gentamicin (GARAMYCIN) 410 mg in dextrose 5 % 100 mL IVPB    [facility], PA-C [facility] Pre-Surgical Testing [phone]

## 2023-04-23 NOTE — Anesthesia Preprocedure Evaluation (Signed)
 Anesthesia Evaluation  Patient identified by MRN, date of birth, ID band Patient awake    Reviewed: Allergy & Precautions, H&P , NPO status , Patient's Chart, lab work & pertinent test results  Airway Mallampati: III  TM Distance: >3 FB Neck ROM: Full    Dental no notable dental hx. (+) Teeth Intact, Dental Advisory Given   Pulmonary neg pulmonary ROS, former smoker   Pulmonary exam normal breath sounds clear to auscultation       Cardiovascular Exercise Tolerance: Good hypertension, Pt. on medications  Rhythm:Regular Rate:Normal     Neuro/Psych negative neurological ROS  negative psych ROS   GI/Hepatic negative GI ROS, Neg liver ROS,,,  Endo/Other  negative endocrine ROS    Renal/GU negative Renal ROS  negative genitourinary   Musculoskeletal  (+) Arthritis , Osteoarthritis,    Abdominal   Peds  Hematology negative hematology ROS (+)   Anesthesia Other Findings   Reproductive/Obstetrics negative OB ROS                             Anesthesia Physical Anesthesia Plan  ASA: 3  Anesthesia Plan: General   Post-op Pain Management: Minimal or no pain anticipated, Tylenol PO (pre-op)* and Celebrex PO (pre-op)*   Induction: Intravenous  PONV Risk Score and Plan: 3 and Ondansetron, Dexamethasone and Midazolam  Airway Management Planned: Oral ETT  Additional Equipment: None  Intra-op Plan:   Post-operative Plan: Extubation in OR  Informed Consent: I have reviewed the patients History and Physical, chart, labs and discussed the procedure including the risks, benefits and alternatives for the proposed anesthesia with the patient or authorized representative who has indicated his/her understanding and acceptance.     Dental advisory given  Plan Discussed with: CRNA and Anesthesiologist  Anesthesia Plan Comments:         Anesthesia Quick Evaluation

## 2023-04-24 ENCOUNTER — Ambulatory Visit (HOSPITAL_BASED_OUTPATIENT_CLINIC_OR_DEPARTMENT_OTHER): Payer: Self-pay | Admitting: Certified Registered Nurse Anesthetist

## 2023-04-24 ENCOUNTER — Other Ambulatory Visit: Payer: Self-pay

## 2023-04-24 ENCOUNTER — Encounter (HOSPITAL_COMMUNITY): Payer: Self-pay | Admitting: Urology

## 2023-04-24 ENCOUNTER — Encounter (HOSPITAL_COMMUNITY)
Admission: RE | Disposition: A | Discharge status: Home / Self Care | Payer: Self-pay | Source: Ambulatory Visit | Attending: Urology

## 2023-04-24 ENCOUNTER — Ambulatory Visit (HOSPITAL_COMMUNITY)
Admission: RE | Admit: 2023-04-24 | Discharge: 2023-04-24 | Disposition: A | Discharge status: Home / Self Care | Payer: No Typology Code available for payment source | Source: Ambulatory Visit | Attending: Urology | Admitting: Urology

## 2023-04-24 ENCOUNTER — Ambulatory Visit (HOSPITAL_COMMUNITY): Payer: Self-pay | Admitting: Physician Assistant

## 2023-04-24 DIAGNOSIS — I10 Essential (primary) hypertension: Secondary | ICD-10-CM | POA: Diagnosis not present

## 2023-04-24 DIAGNOSIS — Z8546 Personal history of malignant neoplasm of prostate: Secondary | ICD-10-CM | POA: Diagnosis not present

## 2023-04-24 DIAGNOSIS — Z87891 Personal history of nicotine dependence: Secondary | ICD-10-CM | POA: Diagnosis not present

## 2023-04-24 DIAGNOSIS — N393 Stress incontinence (female) (male): Secondary | ICD-10-CM | POA: Insufficient documentation

## 2023-04-24 DIAGNOSIS — Z9079 Acquired absence of other genital organ(s): Secondary | ICD-10-CM | POA: Insufficient documentation

## 2023-04-24 HISTORY — PX: URETHRAL SLING: SHX2621

## 2023-04-24 SURGERY — CREATION, URETHRAL SLING, MALE
Anesthesia: General | Site: Pelvis

## 2023-04-24 MED ORDER — CEFAZOLIN SODIUM-DEXTROSE 2-3 GM-%(50ML) IV SOLR
INTRAVENOUS | Status: DC | PRN
  Administered 2023-04-24: 2 g via INTRAVENOUS

## 2023-04-24 MED ORDER — CHLORHEXIDINE GLUCONATE 0.12 % MT SOLN
15.0000 mL | Freq: Once | OROMUCOSAL | Status: AC
  Administered 2023-04-24: 15 mL via OROMUCOSAL

## 2023-04-24 MED ORDER — ONDANSETRON HCL 4 MG/2ML IJ SOLN
INTRAMUSCULAR | Status: AC
  Filled 2023-04-24: qty 2

## 2023-04-24 MED ORDER — OXYCODONE HCL 5 MG PO TABS: 5.0000 mg | ORAL_TABLET | Freq: Four times a day (QID) | ORAL | 0 refills | Status: DC | PRN

## 2023-04-24 MED ORDER — GLYCOPYRROLATE 0.2 MG/ML IJ SOLN
INTRAMUSCULAR | Status: DC | PRN
  Administered 2023-04-24 (×2): .1 mg via INTRAVENOUS

## 2023-04-24 MED ORDER — SUGAMMADEX SODIUM 200 MG/2ML IV SOLN
INTRAVENOUS | Status: AC
  Filled 2023-04-24: qty 2

## 2023-04-24 MED ORDER — ROCURONIUM BROMIDE 100 MG/10ML IV SOLN
INTRAVENOUS | Status: DC | PRN
  Administered 2023-04-24: 60 mg via INTRAVENOUS

## 2023-04-24 MED ORDER — PHENYLEPHRINE 80 MCG/ML (10ML) SYRINGE FOR IV PUSH (FOR BLOOD PRESSURE SUPPORT)
PREFILLED_SYRINGE | INTRAVENOUS | Status: AC
  Filled 2023-04-24: qty 10

## 2023-04-24 MED ORDER — CELECOXIB 200 MG PO CAPS: 200.0000 mg | ORAL_CAPSULE | Freq: Two times a day (BID) | ORAL | 1 refills | Status: AC

## 2023-04-24 MED ORDER — LACTATED RINGERS IV SOLN: INTRAVENOUS | Status: DC

## 2023-04-24 MED ORDER — MIDAZOLAM HCL 2 MG/2ML IJ SOLN
INTRAMUSCULAR | Status: DC | PRN
  Administered 2023-04-24: 2 mg via INTRAVENOUS

## 2023-04-24 MED ORDER — ACETAMINOPHEN 160 MG/5ML PO SOLN: 325.0000 mg | ORAL | Status: DC | PRN

## 2023-04-24 MED ORDER — ACETAMINOPHEN 500 MG PO TABS
1000.0000 mg | ORAL_TABLET | Freq: Once | ORAL | Status: AC
  Administered 2023-04-24: 1000 mg via ORAL
  Filled 2023-04-24: qty 2

## 2023-04-24 MED ORDER — OXYCODONE HCL 5 MG/5ML PO SOLN: 5.0000 mg | Freq: Once | ORAL | Status: DC | PRN

## 2023-04-24 MED ORDER — CEFAZOLIN SODIUM-DEXTROSE 2-4 GM/100ML-% IV SOLN
2.0000 g | INTRAVENOUS | Status: DC
Start: 2023-04-24 — End: 2023-04-24
  Filled 2023-04-24: qty 100

## 2023-04-24 MED ORDER — LIDOCAINE HCL (PF) 1 % IJ SOLN
INTRAMUSCULAR | Status: DC | PRN
  Administered 2023-04-24: 5 mL
  Administered 2023-04-24: 12 mL

## 2023-04-24 MED ORDER — FENTANYL CITRATE (PF) 100 MCG/2ML IJ SOLN
INTRAMUSCULAR | Status: AC
  Filled 2023-04-24: qty 2

## 2023-04-24 MED ORDER — DEXAMETHASONE SODIUM PHOSPHATE 4 MG/ML IJ SOLN
INTRAMUSCULAR | Status: DC | PRN
  Administered 2023-04-24: 8 mg via INTRAVENOUS

## 2023-04-24 MED ORDER — FENTANYL CITRATE PF 50 MCG/ML IJ SOSY: 25.0000 ug | PREFILLED_SYRINGE | INTRAMUSCULAR | Status: DC | PRN

## 2023-04-24 MED ORDER — ORAL CARE MOUTH RINSE: 15.0000 mL | Freq: Once | OROMUCOSAL | Status: AC

## 2023-04-24 MED ORDER — CELECOXIB 200 MG PO CAPS
200.0000 mg | ORAL_CAPSULE | Freq: Once | ORAL | Status: AC
  Administered 2023-04-24: 200 mg via ORAL
  Filled 2023-04-24: qty 1

## 2023-04-24 MED ORDER — EPHEDRINE SULFATE-NACL 50-0.9 MG/10ML-% IV SOSY
PREFILLED_SYRINGE | INTRAVENOUS | Status: DC | PRN
  Administered 2023-04-24 (×2): 10 mg via INTRAVENOUS

## 2023-04-24 MED ORDER — ROCURONIUM BROMIDE 10 MG/ML (PF) SYRINGE
PREFILLED_SYRINGE | INTRAVENOUS | Status: AC
  Filled 2023-04-24: qty 10

## 2023-04-24 MED ORDER — ONDANSETRON HCL 4 MG/2ML IJ SOLN
INTRAMUSCULAR | Status: DC | PRN
  Administered 2023-04-24: 4 mg via INTRAVENOUS

## 2023-04-24 MED ORDER — MEPERIDINE HCL 50 MG/ML IJ SOLN: 6.2500 mg | INTRAMUSCULAR | Status: DC | PRN

## 2023-04-24 MED ORDER — PHENYLEPHRINE HCL-NACL 20-0.9 MG/250ML-% IV SOLN
INTRAVENOUS | Status: DC | PRN
  Administered 2023-04-24: 30 ug/min via INTRAVENOUS

## 2023-04-24 MED ORDER — SUCCINYLCHOLINE CHLORIDE 200 MG/10ML IV SOSY
PREFILLED_SYRINGE | INTRAVENOUS | Status: AC
  Filled 2023-04-24: qty 10

## 2023-04-24 MED ORDER — MIDAZOLAM HCL 2 MG/2ML IJ SOLN
INTRAMUSCULAR | Status: AC
  Filled 2023-04-24: qty 2

## 2023-04-24 MED ORDER — CHLORHEXIDINE GLUCONATE 4 % EX SOLN: Freq: Once | CUTANEOUS | Status: DC

## 2023-04-24 MED ORDER — PROPOFOL 10 MG/ML IV BOLUS
INTRAVENOUS | Status: DC | PRN
  Administered 2023-04-24: 170 mg via INTRAVENOUS
  Administered 2023-04-24: 30 mg via INTRAVENOUS

## 2023-04-24 MED ORDER — PHENYLEPHRINE HCL (PRESSORS) 10 MG/ML IV SOLN
INTRAVENOUS | Status: DC | PRN
  Administered 2023-04-24 (×2): 100 ug via INTRAVENOUS
  Administered 2023-04-24: 200 ug via INTRAVENOUS

## 2023-04-24 MED ORDER — OXYCODONE HCL 5 MG PO TABS: 5.0000 mg | ORAL_TABLET | Freq: Once | ORAL | Status: DC | PRN

## 2023-04-24 MED ORDER — SUGAMMADEX SODIUM 200 MG/2ML IV SOLN
INTRAVENOUS | Status: DC | PRN
  Administered 2023-04-24: 200 mg via INTRAVENOUS

## 2023-04-24 MED ORDER — ACETAMINOPHEN 325 MG PO TABS: 325.0000 mg | ORAL_TABLET | ORAL | Status: DC | PRN

## 2023-04-24 MED ORDER — EPHEDRINE 5 MG/ML INJ
INTRAVENOUS | Status: AC
  Filled 2023-04-24: qty 5

## 2023-04-24 MED ORDER — ONDANSETRON HCL 4 MG/2ML IJ SOLN: 4.0000 mg | Freq: Once | INTRAMUSCULAR | Status: DC | PRN

## 2023-04-24 MED ORDER — DEXAMETHASONE SODIUM PHOSPHATE 10 MG/ML IJ SOLN
INTRAMUSCULAR | Status: AC
  Filled 2023-04-24: qty 1

## 2023-04-24 MED ORDER — FENTANYL CITRATE (PF) 100 MCG/2ML IJ SOLN
INTRAMUSCULAR | Status: DC | PRN
Start: 2023-04-24 — End: 2023-04-24
  Administered 2023-04-24 (×2): 50 ug via INTRAVENOUS

## 2023-04-24 MED ORDER — LIDOCAINE HCL (PF) 1 % IJ SOLN
INTRAMUSCULAR | Status: AC
  Filled 2023-04-24: qty 30

## 2023-04-24 MED ORDER — GLYCOPYRROLATE 0.2 MG/ML IJ SOLN
INTRAMUSCULAR | Status: AC
  Filled 2023-04-24: qty 1

## 2023-04-24 MED ORDER — LIDOCAINE HCL (PF) 2 % IJ SOLN
INTRAMUSCULAR | Status: AC
  Filled 2023-04-24: qty 5

## 2023-04-24 MED ORDER — 0.9 % SODIUM CHLORIDE (POUR BTL) OPTIME
TOPICAL | Status: DC | PRN
  Administered 2023-04-24: 1000 mL

## 2023-04-24 MED ORDER — SULFAMETHOXAZOLE-TRIMETHOPRIM 800-160 MG PO TABS: 1.0000 | ORAL_TABLET | Freq: Two times a day (BID) | ORAL | 0 refills | Status: AC

## 2023-04-24 MED ORDER — PROPOFOL 10 MG/ML IV BOLUS
INTRAVENOUS | Status: AC
  Filled 2023-04-24: qty 20

## 2023-04-24 MED ORDER — ACETAMINOPHEN 500 MG PO TABS: 1000.0000 mg | ORAL_TABLET | Freq: Four times a day (QID) | ORAL | 0 refills | Status: DC

## 2023-04-24 MED ORDER — BUPIVACAINE HCL (PF) 0.5 % IJ SOLN
INTRAMUSCULAR | Status: AC
Start: 2023-04-24 — End: ?
  Filled 2023-04-24: qty 30

## 2023-04-24 SURGICAL SUPPLY — 48 items
BAG COUNTER SPONGE SURGICOUNT (BAG) IMPLANT
BAG URINE DRAIN 2000ML AR STRL (UROLOGICAL SUPPLIES) ×2 IMPLANT
BLADE SURG 15 STRL LF DISP TIS (BLADE) ×2 IMPLANT
BNDG GAUZE DERMACEA FLUFF 4 (GAUZE/BANDAGES/DRESSINGS) ×2 IMPLANT
BRIEF MESH DISP LRG (UNDERPADS AND DIAPERS) ×2 IMPLANT
CATH FOLEY 2WAY SLVR 5CC 14FR (CATHETERS) ×2 IMPLANT
CATH TIEMANN FOLEY 18FR 5CC (CATHETERS) ×2 IMPLANT
CHLORAPREP W/TINT 26 (MISCELLANEOUS) ×4 IMPLANT
COVER BACK TABLE 60X90IN (DRAPES) ×2 IMPLANT
COVER MAYO STAND STRL (DRAPES) ×2 IMPLANT
COVER SURGICAL LIGHT HANDLE (MISCELLANEOUS) ×2 IMPLANT
DERMABOND ADVANCED .7 DNX12 (GAUZE/BANDAGES/DRESSINGS) ×4 IMPLANT
DRAPE UNDERBUTTOCKS STRL (DISPOSABLE) ×2 IMPLANT
DRSG TELFA 3X8 NADH STRL (GAUZE/BANDAGES/DRESSINGS) ×2 IMPLANT
ELECT PENCIL ROCKER SW 15FT (MISCELLANEOUS) ×2 IMPLANT
GAUZE 4X4 16PLY ~~LOC~~+RFID DBL (SPONGE) ×4 IMPLANT
GLOVE BIOGEL M 7.0 STRL (GLOVE) ×2 IMPLANT
GLOVE BIOGEL PI IND STRL 7.0 (GLOVE) ×2 IMPLANT
GOWN STRL REUS W/ TWL XL LVL3 (GOWN DISPOSABLE) ×2 IMPLANT
HOLDER FOLEY CATH W/STRAP (MISCELLANEOUS) ×2 IMPLANT
KIT BASIN OR (CUSTOM PROCEDURE TRAY) ×2 IMPLANT
KIT TURNOVER KIT A (KITS) IMPLANT
LUBRICANT JELLY K Y 4OZ (MISCELLANEOUS) ×2 IMPLANT
NDL HYPO 22X1.5 SAFETY MO (MISCELLANEOUS) ×2 IMPLANT
NDL SPNL 22GX3.5 QUINCKE BK (NEEDLE) ×2 IMPLANT
NEEDLE HYPO 22X1.5 SAFETY MO (MISCELLANEOUS) ×1 IMPLANT
NEEDLE SPNL 22GX3.5 QUINCKE BK (NEEDLE) ×1 IMPLANT
PLUG CATH AND CAP STRL 200 (CATHETERS) ×2 IMPLANT
PROTECTOR NERVE ULNAR (MISCELLANEOUS) ×2 IMPLANT
RETRACTOR DEEP SCROTAL PENILE (MISCELLANEOUS) IMPLANT
SET IRRIG Y TYPE TUR BLADDER L (SET/KITS/TRAYS/PACK) ×2 IMPLANT
SHEET LAVH (DRAPES) ×2 IMPLANT
SLING ADVANCE MALE SYSTEM (Mesh General) IMPLANT
SPIKE FLUID TRANSFER (MISCELLANEOUS) IMPLANT
STAPLER SKIN PROX 35W (STAPLE) IMPLANT
STAPLER SKIN PROX WIDE 3.9 (STAPLE) ×2 IMPLANT
SUT MNCRL AB 4-0 PS2 18 (SUTURE) ×2 IMPLANT
SUT SILK 2 0 30 PSL (SUTURE) IMPLANT
SUT VIC AB 2-0 SH 27X BRD (SUTURE) IMPLANT
SUT VIC AB 3-0 SH 27XBRD (SUTURE) ×8 IMPLANT
SUT VIC AB 4-0 PS2 27 (SUTURE) IMPLANT
SUT VIC AB 4-0 RB1 27XBRD (SUTURE) IMPLANT
SYR 10ML LL (SYRINGE) ×2 IMPLANT
SYR 20ML LL LF (SYRINGE) ×2 IMPLANT
SYR BULB IRRIG 60ML STRL (SYRINGE) ×2 IMPLANT
TOWEL OR 17X26 10 PK STRL BLUE (TOWEL DISPOSABLE) ×4 IMPLANT
TUBING CONNECTING 10 (TUBING) ×2 IMPLANT
YANKAUER SUCT BULB TIP 10FT TU (MISCELLANEOUS) ×2 IMPLANT

## 2023-04-24 NOTE — Anesthesia Procedure Notes (Signed)
 Procedure Name: Intubation Date/Time: 04/24/2023 10:21 AM  Performed by: Floydene Flock, CRNAPre-anesthesia Checklist: Patient identified, Emergency Drugs available, Suction available and Patient being monitored Patient Re-evaluated:Patient Re-evaluated prior to induction Oxygen Delivery Method: Circle system utilized Preoxygenation: Pre-oxygenation with 100% oxygen Induction Type: IV induction Ventilation: Two handed mask ventilation required and Oral airway inserted - appropriate to patient size Grade View: Grade I Tube type: Oral Tube size: 7.5 mm Number of attempts: 1 Airway Equipment and Method: Stylet Secured at: 22 cm Tube secured with: Tape Dental Injury: Teeth and Oropharynx as per pre-operative assessment

## 2023-04-24 NOTE — H&P (Signed)
 H&P  History of Present Illness: Ernest Hudson is a 68 y.o. year old M who presents today for insertion of a sling  No acute complaints  Past Medical History:  Diagnosis Date   Adrenal mass (HCC)    "benign" (04/08/2013   Allergic rhinitis    Arthritis    "probably in my fingers" (04/08/2013)   Basal cell carcinoma 2000's   'chest"   Birt-Hogg-Dube syndrome    Bradycardia    Dyslipidemia (high LDL; low HDL)    Dysrhythmia    bradycardia   Hearing loss in left ear    "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"   History of blood transfusion ?1995   History of kidney stones    Hx of colonic polyps    Hyperlipidemia    Hypertension    Ileus, postoperative (HCC) 04/17/2013   Pneumonia    Spontaneous pneumothorax 1995   Squamous cell carcinoma of head and neck     Past Surgical History:  Procedure Laterality Date   APPENDECTOMY  04/07/2013   Procedure: OPEN APPENDECTOMY;  Surgeon: Atilano Ina, MD;  Location: [facility] OR;  Service: General;;   cardiolyte stress  04/2008   CHOLECYSTECTOMY  2004   COLONOSCOPY  06/2005   ELBOW SURGERY Left 2001   "tendonitis"   EXTERNAL EAR SURGERY Left 1971   HERNIA REPAIR     INCISIONAL HERNIA REPAIR N/A 05/12/2014   Procedure: OPEN REPAIR OF INCISIONAL HERNIA REPAIR;  Surgeon: Gaynelle Adu, MD;  Location: [facility];  Service: General;  Laterality: N/A;   INGUINAL HERNIA REPAIR Left 05/31/2017   Procedure: LEFT INGUINAL HERNIA REPAIR WITH MESH;  Surgeon: Gaynelle Adu, MD;  Location: [facility];  Service: General;  Laterality: Left;   INSERTION OF MESH N/A 05/12/2014   Procedure: WITH INSERTION OF MESH;  Surgeon: Gaynelle Adu, MD;  Location: [facility];  Service: General;  Laterality: N/A;   INSERTION OF MESH Left 05/31/2017   Procedure: INSERTION OF MESH;  Surgeon: Gaynelle Adu, MD;  Location:  SURGERY CENTER;  Service: General;  Laterality: Left;   KNEE ARTHROSCOPY Left 1990's   LAPAROSCOPIC APPENDECTOMY N/A 04/07/2013   Procedure: ATTEMPTED  APPENDECTOMY LAPAROSCOPIC;  Surgeon: Atilano Ina, MD;  Location: [facility] OR;  Service: General;  Laterality: N/A;   LUNG SURGERY     "repaired ruptured bleb" '95   LYMPHADENECTOMY Bilateral 08/05/2020   Procedure: LYMPHADENECTOMY, PELVIC;  Surgeon: Heloise Purpura, MD;  Location: [facility];  Service: Urology;  Laterality: Bilateral;   MIDDLE EAR SURGERY Left 1972   exploratory tympanotomy/notes 07/17/2000   NASAL SEPTUM SURGERY     PARTIAL COLECTOMY  04/07/2013   Procedure: PARTIAL COLECTOMY, ILEOCECTOMY;  Surgeon: Atilano Ina, MD;  Location: [facility] OR;  Service: General;;   ROBOT ASSISTED LAPAROSCOPIC RADICAL PROSTATECTOMY N/A 08/05/2020   Procedure: XI ROBOTIC ASSISTED LAPAROSCOPIC RADICAL PROSTATECTOMY LEVEL 3;  Surgeon: Heloise Purpura, MD;  Location: [facility];  Service: Urology;  Laterality: N/A;  ONLY NEEDS 210 MIN   ROBOTIC ASSISTED LAPAROSCOPIC LYSIS OF ADHESION  08/05/2020   Procedure: XI ROBOTIC ASSISTED LAPAROSCOPIC LYSIS OF ADHESION;  Surgeon: Heloise Purpura, MD;  Location: [facility];  Service: Urology;;   SKIN CANCER EXCISION  2000's   "chest"   TENDON REPAIR Left 1990's   "thumb"    Home Medications:  Current Meds  Medication Sig   amLODipine (NORVASC) 10 MG tablet TAKE ONE TABLET BY MOUTH DAILY   cetirizine (ZYRTEC) 10 MG tablet Take 10 mg by mouth  every morning.   fluticasone (FLONASE) 50 MCG/ACT nasal spray Place 2 sprays into both nostrils daily as needed. For allergies.   lisinopril-hydrochlorothiazide (ZESTORETIC) 20-25 MG tablet TAKE ONE TABLET BY MOUTH DAILY   rosuvastatin (CRESTOR) 10 MG tablet TAKE 1 TABLET BY MOUTH DAILY    Allergies:  Allergies  Allergen Reactions   Lipitor [Atorvastatin]     Body sore all over   Niacin Itching and Rash    Family History  Problem Relation Age of Onset   Breast cancer Mother    Diabetes Mother    Cancer Mother        breast   Heart attack Father    Diabetes Father    Prostate cancer Father    Cancer Father        prostate    Healthy Sister    Lung disease Sister        has blebs on MRI,    Healthy Brother    Prostate cancer Brother    Cancer Brother        prostate cancer   Healthy Brother    Breast cancer Daughter    Breast cancer Maternal Aunt        diagnosed in her 25s   Breast cancer Maternal Aunt        diagnosed in her 22s   Breast cancer Maternal Aunt        diagnosed in her 70s   Melanoma Maternal Uncle    Cancer Cousin        multiple myeloma   Colon cancer Neg Hx     Social History:  reports that he quit smoking about 10 years ago. His smoking use included cigarettes. He started smoking about 40 years ago. He has a 60 pack-year smoking history. He has never been exposed to tobacco smoke. He has never used smokeless tobacco. He reports that he does not drink alcohol and does not use drugs.  ROS: A complete review of systems was performed.  All systems are negative except for pertinent findings as noted.  Physical Exam:  Vital signs in last 24 hours: Temp:  [97.7 F (36.5 C)] 97.7 F (36.5 C) (03/04 0704) Pulse Rate:  [60] 60 (03/04 0704) Resp:  [16] 16 (03/04 0704) BP: (161)/(80) 161/80 (03/04 0704) SpO2:  [97 %] 97 % (03/04 0704) Weight:  [95.3 kg] 95.3 kg (03/04 0717) Constitutional:  Alert and oriented, No acute distress Cardiovascular: Regular rate and rhythm Respiratory: Normal respiratory effort, Lungs clear bilaterally GI: Abdomen is soft, nontender, nondistended, no abdominal masses Lymphatic: No lymphadenopathy Neurologic: Grossly intact, no focal deficits Psychiatric: Normal mood and affect   Laboratory Data:  No results for input(s): "WBC", "HGB", "HCT", "PLT" in the last 72 hours.  No results for input(s): "NA", "K", "CL", "GLUCOSE", "BUN", "CALCIUM", "CREATININE" in the last 72 hours.  Invalid input(s): "CO3"   No results found for this or any previous visit (from the past 24 hours). Recent Results (from the past 240 hours)  Urine Culture     Status: None    Collection Time: 04/19/23  8:20 AM   Specimen: Urine, Clean Catch  Result Value Ref Range Status   Specimen Description   Final    URINE, CLEAN CATCH Performed at [facility], [address] [place], [place] [address]    Special Requests   Final    NONE Performed at [facility], [address], [place], [place] [address]    Culture  Final    NO GROWTH Performed at [facility], [address] [place], [place] [address]    Report Status 04/20/2023 FINAL  Final    Renal Function: Recent Labs    04/19/23 0820  CREATININE 1.14   Estimated Creatinine Clearance: 71.8 mL/min (by C-G formula based on SCr of 1.14 mg/dL).  Radiologic Imaging: No results found.  Assessment:  Ernest Hudson is a 68 y.o. year old M with SUI after prostatectomy  Plan:  To OR as planned for sling. Procedure and risks reviewed, including but not limited to bleeding, infection, implant infection, implant malfunction, implant malplacement, erosion, damage to adjacent structures, pain, urinary retention, failure to improve or make dry. All questions answered   Irine Seal, MD 04/24/2023, 9:13 AM  [facility] Pager: [phone]

## 2023-04-24 NOTE — Op Note (Signed)
 Preoperative diagnosis:  1. Stress urinary incontinence 2. History of prostate cancer s/p prostatectomy  Postoperative diagnosis: same  Procedure(s): 1. Insertion of male sling (advance XP) 2. Flexible cystoscopy  Surgeon: Dr. Irine Seal  Anesthesia: General  Complications: none  EBL: 20  Intraoperative findings: Good bulbar urethra displacement and urethral coaptation after placement and tightening of sling  Indication: Ernest Hudson is a 68 y.o.yo M who with stress incontinence s/p prostatectomy. After an extensive discussion, he is interested in proceeding with a sling  Description of procedure:  After appropriate consent had been obtained, the patient was brought to the operative suite where anesthesia was induced. The patient was prepped and draped in the usual sterile fashion. Extra care was taken with leg positioning to minimize the risk of compartment syndrome neuropathy and deep vein thrombosis.  Preoperative antibiotics were given.  I an made approximately 5 cm incision in the perineum involving the lower aspect of the scrotum.  I dissected down through soft tissue and mobilized the subcutaneous tissue from the bulbospongiosus midline. The lonestar retractor was placed and used throughout for exposure. I then split the muscle in the midline with my usual technique. Using a curved cerebellar retractor I exposed nicely the bulbar urethra.  It was easy to see and feel the perineal tendon that was sharply taken down with Metzenbaum scissors.  There was 2 to 4 cm of mobility of the bulbar urethra towards the patient's head.  I was very pleased with identification and mobilization. I placed a 3-0 Vicryl where the tendon was initially attached to the bulb  I was very diligent at the beginning of the case and throughout the case to feel the abductor tendon relative to the obturator foramen.  I marked the area of entrance of the foramen needle with a marking pen.  I used 22 French  foramen needle to find the inferior rami and then through the foramen itself below the tendon.  I kept the angle appropriate with the patient in Trendelenburg.  I made a scalpel incision on the patient's left side approximately 1 cm in length.  With appropriate exposure I placed my finger in the upper aspect of the triangle on the patient's left side.  With the described technique I passed a trocar initially placing it against the buttock at 45 degree angle.  With my thumb I did a double pop maneuver through the soft tissues layers and then dropping the handle and delivering the tip onto the pulp of my index finger on the patient's left side.  The foramen needle was easily passed and with correct orientation the mesh was attached.  I double checked the location of the needle and was excellent.  I gently rotated the trocar with the mesh sling coming back through the skin  I did the exact same procedure on the right.  The mesh was attached in correct orientation.  Gently pulling on both slings and with my finger between the bulb and the mesh I gently brought the mesh close to the bulbar urethra.  The 3-0 Vicryl that was initially placed through the sponge was brought out through the distal aspect of the mesh.  I then gently synched the mesh down to the bulb.  I placed two 3-0 Vicryl's in the proximal aspect of the mesh through the bulbospongiosus.  I then passed another in the midline.  I was very happy with the orientation of the mesh.  I then gently tensioned the sling over the 18 Fr  foley until it "clam-shelled." I then inserted the cystoscope to examine. Ultimately I tensioned the sling just a little more and I was very happy with the coaptation of the urethra and stopped at that point. A 14 fr catheter was then inserted.  Visually and palpably there was excellent rotation of the bulbar urethra.   The sling was cut below each blue dot.  It had been rolled by my assistant prior to help for its release.   I irrigated.  I placed a hemostat deeply across the silicone and white sheath removing them easily.  This was done on both sides.  The mesh was cut below the skin level in the inguinal creases.  I closed each inguinal incision with 2 interrupted 4-0 Vicryl followed by Dermabond.  A 4 layer closure was used for the perineum.  I was cognizant of the scrotal aspect of the closure with 3-0 Vicryl that also included reapproximation of the bulbospongiosus muscle.  The next layer was deeper subcutaneous suture with 3-0 Vicryl.  A third layer was close to the skin with level with 3-0 Vicryl.  4-0 running mattress sutures used for the perineum.  Dermabond was applied with fluff dressing  The Foley catheter was draining well at the end of the case.  Leg position was good. I was very pleased with the surgery and hopefully it reaches the patient's treatment goal  Irine Seal MD 04/24/2023, 11:34 AM  [facility]  Pager: [phone]

## 2023-04-24 NOTE — Transfer of Care (Signed)
 Immediate Anesthesia Transfer of Care Note  Patient: Ernest Hudson  Procedure(s) Performed: CREATION, URETHRAL SLING, MALE (Pelvis)  Patient Location: PACU  Anesthesia Type:General  Level of Consciousness: drowsy  Airway & Oxygen Therapy: 8 lpm simple face mask  Post-op Assessment: Report given to RN and Post -op Vital signs reviewed and stable  Post vital signs: Reviewed and stable  Last Vitals:  Vitals Value Taken Time  BP 142/74 04/24/23 1200  Temp    Pulse 81 04/24/23 1200  Resp 12 04/24/23 1200  SpO2 100 % 04/24/23 1200  Vitals shown include unfiled device data.  Last Pain:  Vitals:   04/24/23 0717  TempSrc:   PainSc: 0-No pain         Complications: No notable events documented.

## 2023-04-24 NOTE — Anesthesia Postprocedure Evaluation (Signed)
 Anesthesia Post Note  Patient: Ernest Hudson  Procedure(s) Performed: CREATION, URETHRAL SLING, MALE (Pelvis)     Anesthesia Type: General Anesthetic complications: no   No notable events documented.  Last Vitals:  Vitals:   04/24/23 1215 04/24/23 1230  BP: 116/66 132/71  Pulse: 64 63  Resp: 12 13  Temp:    SpO2: 94% 93%    Last Pain:  Vitals:   04/24/23 0717  TempSrc:   PainSc: 0-No pain                 Ervey Fallin

## 2023-04-24 NOTE — Discharge Instructions (Signed)
Male Sling Post Operative Instructions Dr. Luke Machen  You may notice some swelling or black and blue bruising. This is very common, and may increase slightly over the next several days. Typically it will begin to improve 1-2 weeks after surgery You may take a shower 48 hours after surgery. Avoid submerging yourself completely in water (bath tubs, hot tubs, swimming pools, etc) until 1 month after the surgery. To clean the incision, let soapy water gently wash over the area and pat lightly. Use supportive, tight-fitting underwear for the first two weeks after surgery. For example, jock straps, sliding shorts, or briefs Apply ice packs 20 minutes on/20 minutes off for at least the first 2 days following surgery. This will help  minimize swelling and discomfort. After the first few days you can continue using ice packs if they are helpful  The skin incision is closed with sutures and purple skin glue. Both of these things dissolve on their own over the course of several weeks.   Avoid lifting anything heavier than 15 pounds for 1 month Do not put any direct pressure on the incision for long periods of time for the first 6 weeks following surgery. For example, do not ride a bicycle, motorcycle, ATV, or horse.  Sitting on a soft cushion, "donut" cushion, or pillow can help with the discomfort Avoid all sexual contact for 2 weeks following the surgery. You will be prescribed an antibiotic following the surgery; please take this as prescribed.  For pain post-operatively, you will typically be prescribed 2 medicines. The first is an anti-inflammatory medication (Celebrex, Toradol, Meloxicam). The second is narcotic pain medication (Tramadol, Oxycodone). You can take these as needed, and can supplement with over the counter tylenol. I recommend limiting the amount of narcotic pain medication you take as these medications can cause constipation and in rare cases, addiction.   

## 2023-04-25 ENCOUNTER — Encounter (HOSPITAL_COMMUNITY): Payer: Self-pay | Admitting: Urology

## 2023-04-27 ENCOUNTER — Telehealth: Payer: Self-pay

## 2023-04-27 DIAGNOSIS — L570 Actinic keratosis: Secondary | ICD-10-CM | POA: Diagnosis not present

## 2023-04-27 MED ORDER — ROSUVASTATIN CALCIUM 10 MG PO TABS: 10.0000 mg | ORAL_TABLET | Freq: Every day | ORAL | 3 refills | Status: DC

## 2023-04-27 MED ORDER — AMLODIPINE BESYLATE 10 MG PO TABS: 10.0000 mg | ORAL_TABLET | Freq: Every day | ORAL | 3 refills | Status: AC

## 2023-04-27 MED ORDER — LISINOPRIL-HYDROCHLOROTHIAZIDE 20-25 MG PO TABS: 1.0000 | ORAL_TABLET | Freq: Every day | ORAL | 3 refills | Status: AC

## 2023-04-27 NOTE — Telephone Encounter (Signed)
 Rx sent electronically.

## 2023-04-29 ENCOUNTER — Other Ambulatory Visit: Payer: Self-pay | Admitting: Internal Medicine

## 2023-05-15 DIAGNOSIS — H2513 Age-related nuclear cataract, bilateral: Secondary | ICD-10-CM | POA: Diagnosis not present

## 2023-05-15 DIAGNOSIS — H524 Presbyopia: Secondary | ICD-10-CM | POA: Diagnosis not present

## 2023-06-04 DIAGNOSIS — N393 Stress incontinence (female) (male): Secondary | ICD-10-CM | POA: Diagnosis not present

## 2023-06-05 DIAGNOSIS — H903 Sensorineural hearing loss, bilateral: Secondary | ICD-10-CM | POA: Diagnosis not present

## 2023-07-19 DIAGNOSIS — L0889 Other specified local infections of the skin and subcutaneous tissue: Secondary | ICD-10-CM | POA: Diagnosis not present

## 2023-07-19 DIAGNOSIS — L739 Follicular disorder, unspecified: Secondary | ICD-10-CM | POA: Diagnosis not present

## 2023-11-23 DIAGNOSIS — C61 Malignant neoplasm of prostate: Secondary | ICD-10-CM | POA: Diagnosis not present

## 2023-11-30 DIAGNOSIS — R399 Unspecified symptoms and signs involving the genitourinary system: Secondary | ICD-10-CM | POA: Diagnosis not present

## 2023-11-30 DIAGNOSIS — C61 Malignant neoplasm of prostate: Secondary | ICD-10-CM | POA: Diagnosis not present

## 2023-11-30 DIAGNOSIS — N5201 Erectile dysfunction due to arterial insufficiency: Secondary | ICD-10-CM | POA: Diagnosis not present

## 2024-03-04 ENCOUNTER — Telehealth: Payer: Self-pay

## 2024-03-04 ENCOUNTER — Encounter: Payer: Self-pay | Admitting: Nurse Practitioner

## 2024-03-04 ENCOUNTER — Ambulatory Visit: Payer: No Typology Code available for payment source | Admitting: Nurse Practitioner

## 2024-03-04 VITALS — BP 122/80 | HR 60 | Temp 98.2°F | Ht 69.5 in | Wt 206.4 lb

## 2024-03-04 DIAGNOSIS — F39 Unspecified mood [affective] disorder: Secondary | ICD-10-CM | POA: Diagnosis not present

## 2024-03-04 DIAGNOSIS — E669 Obesity, unspecified: Secondary | ICD-10-CM

## 2024-03-04 DIAGNOSIS — Z1159 Encounter for screening for other viral diseases: Secondary | ICD-10-CM

## 2024-03-04 DIAGNOSIS — I10 Essential (primary) hypertension: Secondary | ICD-10-CM

## 2024-03-04 DIAGNOSIS — Z0000 Encounter for general adult medical examination without abnormal findings: Secondary | ICD-10-CM | POA: Diagnosis not present

## 2024-03-04 DIAGNOSIS — Q8789 Other specified congenital malformation syndromes, not elsewhere classified: Secondary | ICD-10-CM

## 2024-03-04 DIAGNOSIS — F332 Major depressive disorder, recurrent severe without psychotic features: Secondary | ICD-10-CM | POA: Diagnosis not present

## 2024-03-04 DIAGNOSIS — Z122 Encounter for screening for malignant neoplasm of respiratory organs: Secondary | ICD-10-CM

## 2024-03-04 DIAGNOSIS — Z8546 Personal history of malignant neoplasm of prostate: Secondary | ICD-10-CM | POA: Diagnosis not present

## 2024-03-04 DIAGNOSIS — I700 Atherosclerosis of aorta: Secondary | ICD-10-CM

## 2024-03-04 DIAGNOSIS — R251 Tremor, unspecified: Secondary | ICD-10-CM

## 2024-03-04 LAB — COMPREHENSIVE METABOLIC PANEL WITH GFR
ALT: 31 U/L (ref 3–53)
AST: 21 U/L (ref 5–37)
Albumin: 4.6 g/dL (ref 3.5–5.2)
Alkaline Phosphatase: 82 U/L (ref 39–117)
BUN: 17 mg/dL (ref 6–23)
CO2: 32 meq/L (ref 19–32)
Calcium: 9.7 mg/dL (ref 8.4–10.5)
Chloride: 104 meq/L (ref 96–112)
Creatinine, Ser: 1.07 mg/dL (ref 0.40–1.50)
GFR: 70.76 mL/min
Glucose, Bld: 95 mg/dL (ref 70–99)
Potassium: 4.2 meq/L (ref 3.5–5.1)
Sodium: 145 meq/L (ref 135–145)
Total Bilirubin: 0.7 mg/dL (ref 0.2–1.2)
Total Protein: 7.4 g/dL (ref 6.0–8.3)

## 2024-03-04 LAB — CBC WITH DIFFERENTIAL/PLATELET
Basophils Absolute: 0 K/uL (ref 0.0–0.1)
Basophils Relative: 0.5 % (ref 0.0–3.0)
Eosinophils Absolute: 0.3 K/uL (ref 0.0–0.7)
Eosinophils Relative: 5.5 % — ABNORMAL HIGH (ref 0.0–5.0)
HCT: 47.8 % (ref 39.0–52.0)
Hemoglobin: 16.8 g/dL (ref 13.0–17.0)
Lymphocytes Relative: 23.3 % (ref 12.0–46.0)
Lymphs Abs: 1.5 K/uL (ref 0.7–4.0)
MCHC: 35.2 g/dL (ref 30.0–36.0)
MCV: 87.7 fl (ref 78.0–100.0)
Monocytes Absolute: 0.4 K/uL (ref 0.1–1.0)
Monocytes Relative: 6.1 % (ref 3.0–12.0)
Neutro Abs: 4.1 K/uL (ref 1.4–7.7)
Neutrophils Relative %: 64.6 % (ref 43.0–77.0)
Platelets: 235 K/uL (ref 150.0–400.0)
RBC: 5.46 Mil/uL (ref 4.22–5.81)
RDW: 13.9 % (ref 11.5–15.5)
WBC: 6.3 K/uL (ref 4.0–10.5)

## 2024-03-04 LAB — LIPID PANEL
Cholesterol: 128 mg/dL (ref 28–200)
HDL: 43.5 mg/dL
LDL Cholesterol: 69 mg/dL (ref 10–99)
NonHDL: 84.83
Total CHOL/HDL Ratio: 3
Triglycerides: 79 mg/dL (ref 10.0–149.0)
VLDL: 15.8 mg/dL (ref 0.0–40.0)

## 2024-03-04 LAB — HEMOGLOBIN A1C: Hgb A1c MFr Bld: 5.3 % (ref 4.6–6.5)

## 2024-03-04 LAB — VITAMIN D 25 HYDROXY (VIT D DEFICIENCY, FRACTURES): VITD: 27.63 ng/mL — ABNORMAL LOW (ref 30.00–100.00)

## 2024-03-04 LAB — TSH: TSH: 3.87 u[IU]/mL (ref 0.35–5.50)

## 2024-03-04 LAB — VITAMIN B12: Vitamin B-12: 263 pg/mL (ref 211–911)

## 2024-03-04 MED ORDER — SERTRALINE HCL 50 MG PO TABS: ORAL_TABLET | ORAL | 0 refills | Status: DC

## 2024-03-04 MED ORDER — SERTRALINE HCL 50 MG PO TABS: 50.0000 mg | ORAL_TABLET | Freq: Every day | ORAL | 3 refills | Status: DC

## 2024-03-04 NOTE — Assessment & Plan Note (Signed)
 Discussed age-appropriate immunizations and screening exams.  Did review patient's personal, surgical, social, family histories.  Patient is up-to-date with all age-appropriate vaccinations he would like.  Update flu vaccine today.  Patient is up-to-date on CRC screening.  Patient is followed by urology for prostate cancer.  Patient was given information at discharge about preventative healthcare maintenance with anticipatory guidance.

## 2024-03-04 NOTE — Telephone Encounter (Signed)
 Start taking the medication now. When they are getting low reach out to pharmacy and they will notify me to send in a refill

## 2024-03-04 NOTE — Assessment & Plan Note (Signed)
 Patient currently maintained daily and lisinopril -hydrochlorothiazide  20-25 mg daily.  Blood pressure controlled.  Continue medication as prescribed

## 2024-03-04 NOTE — Assessment & Plan Note (Signed)
 Pending lipid panel.  Patient currently maintained on rosuvastatin  10 mg daily.

## 2024-03-04 NOTE — Patient Instructions (Signed)
 Nice to see you today I will be in touch with the labs Follow up with me in 6 weeks sooner if you need me

## 2024-03-04 NOTE — Telephone Encounter (Signed)
 Copied from CRM #[id number]. Topic: Clinical - Medication Question >> Mar 04, 2024 10:25 AM Suzen RAMAN wrote: Reason for CRM: patient was prescribed a 30 day supply of sertraline  (ZOLOFT ) 50 MG tablet but due back for a follow up in 6 weeks. Patient will be out of medication before follow up. Patient spouse would like to know if patient should start medication today or wait to start taking medication in a couple weeks since the follow up is out for 6 weeks.    CB#[phone] or [phone]

## 2024-03-04 NOTE — Assessment & Plan Note (Signed)
 History of same pending A1c, lipid panel, TSH.  Continue working healthy lifestyle modifications

## 2024-03-04 NOTE — Assessment & Plan Note (Signed)
 Patient denies HI/SI/AVH.  Will start patient on sertraline  25 mg for 10 days then increase to 50 mg daily thereafter.

## 2024-03-04 NOTE — Assessment & Plan Note (Signed)
 Followed by urology continue

## 2024-03-04 NOTE — Telephone Encounter (Signed)
 Called pt. No answer and Unable to leave voicemail.

## 2024-03-04 NOTE — Assessment & Plan Note (Signed)
 Patient is already followed by dermatology.  Most recent imaging of kidneys was performed in 2024 we will repeat in 2026 recommended screenings every 12 to 36 months. Will do LDCT in the setting of tobacco abuse

## 2024-03-04 NOTE — Progress Notes (Signed)
 "  Established Patient Office Visit  Subjective   Patient ID: Ernest Hudson, male    DOB: 1955-12-29  Age: 69 y.o. MRN: [id number]  Chief Complaint  Patient presents with   Annual Exam    Flu vaccine     HPI  HTN: Patient currently maintained on amlodipine  10 mg daily, lisinopril -hydrochlorothiazide  20-25 mg daily.  HLD: Patient currently maintained on rosuvastatin  10 mg daily  Prostate cancer: Followed by urology. Followed by Gretel Ferrara  Brit-Hobb-Dube syndrome: History of same.  Patient is followed dermatology.  Patient does have a history of skin cancer.  Last imaging study was done 08/20/2022 that showed a 2.6 cm 21 Hounsfield units left adrenal nodule stable since 2006.  Also showed a 1 cm exophytic proteinaceous cyst from the lower pole of the right kidney slightly decreased since 2016.  for complete physical and follow up of chronic conditions.  Immunizations: -Tetanus: Completed in 2013, update local pharmacy -Influenza:up date today  -Shingles:get at local pharmacy  -Pneumonia: Completed   Diet: Fair diet. He will eat 2-3 meals and then he may snack. He does coffee and sweet tea. Soda. Some water   Exercise:  He will walk 2 miles a day 4-6 days a week   Eye exam: Completes annually.  Dental exam: Completes semi-annually    Colonoscopy: Completed in 12/13/2015, repeat 10 years.  Patient will be due 2027 Lung Cancer Screening: order today   PSA: Followed by urology  Sleep: goes to bed around 8-11 and then will get up around varying times. He does have troble with sleep at night   Mood: Patient has been dealing with mood for little over 12 months.  States they had a daughter die Christmas of 2024 since then patient has been daily with difficulties in mood.  He states that he is bored.  Watches TV a lot throughout the day and will nap.  Sleeps at night well.  Does not seem to have lots of motivation.  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  He was  able to find reliable help.  Patient never did any medications for mood in the past nor done therapy.  There is also concern for memory     03/04/2024    8:24 AM 03/02/2023   12:55 PM 03/02/2023   12:30 PM  PHQ9 SCORE ONLY  PHQ-9 Total Score 18 1  8       Data saved with a previous flowsheet row definition       03/04/2024    8:24 AM 03/02/2023   12:30 PM  GAD 7 : Generalized Anxiety Score  Nervous, Anxious, on Edge 3 2  Control/stop worrying 2 2  Worry too much - different things 2 0  Trouble relaxing 0 0  Restless 0 0  Easily annoyed or irritable 3 2  Afraid - awful might happen 3 0  Total GAD 7 Score 13 6  Anxiety Difficulty Not difficult at all Somewhat difficult          Review of Systems  Constitutional:  Negative for chills and fever.  Respiratory:  Negative for shortness of breath.   Cardiovascular:  Negative for chest pain and leg swelling.  Gastrointestinal:  Positive for diarrhea (liquid to soft). Negative for abdominal pain, blood in stool, constipation, nausea and vomiting.       Bm daily   Genitourinary:  Negative for dysuria and hematuria.  Neurological:  Negative for tingling and headaches.  Psychiatric/Behavioral:  Negative for  hallucinations and suicidal ideas.       Objective:     BP 122/80   Pulse 60   Temp 98.2 F (36.8 C) (Oral)   Ht 5' 9.5 (1.765 m)   Wt 206 lb 6.4 oz (93.6 kg)   SpO2 97%   BMI 30.04 kg/m  BP Readings from Last 3 Encounters:  03/04/24 122/80  04/24/23 129/86  04/19/23 (!) 151/75   Wt Readings from Last 3 Encounters:  03/04/24 206 lb 6.4 oz (93.6 kg)  04/24/23 210 lb 1.6 oz (95.3 kg)  04/19/23 210 lb (95.3 kg)   SpO2 Readings from Last 3 Encounters:  03/04/24 97%  04/24/23 97%  04/19/23 97%      Physical Exam Vitals and nursing note reviewed.  Constitutional:      Appearance: Normal appearance.  HENT:     Right Ear: Tympanic membrane, ear canal and external ear normal.     Left Ear: Tympanic membrane, ear  canal and external ear normal.     Mouth/Throat:     Mouth: Mucous membranes are moist.     Pharynx: Oropharynx is clear.  Eyes:     Extraocular Movements: Extraocular movements intact.     Pupils: Pupils are equal, round, and reactive to light.  Cardiovascular:     Rate and Rhythm: Normal rate and regular rhythm.     Pulses: Normal pulses.     Heart sounds: Normal heart sounds.  Pulmonary:     Effort: Pulmonary effort is normal.     Breath sounds: Normal breath sounds.  Abdominal:     General: Bowel sounds are normal. There is no distension.     Palpations: There is no mass.     Tenderness: There is no abdominal tenderness.     Hernia: No hernia is present.  Musculoskeletal:     Right lower leg: No edema.     Left lower leg: No edema.  Lymphadenopathy:     Cervical: No cervical adenopathy.  Skin:    General: Skin is warm.  Neurological:     General: No focal deficit present.     Mental Status: He is alert.     Deep Tendon Reflexes:     Reflex Scores:      Bicep reflexes are 2+ on the right side and 2+ on the left side.      Patellar reflexes are 2+ on the right side and 2+ on the left side.    Comments: Bilateral upper and lower extremity strength 5/5  Psychiatric:        Mood and Affect: Mood normal. Affect is flat and tearful.        Behavior: Behavior normal.        Thought Content: Thought content normal.        Judgment: Judgment normal.      No results found for any visits on 03/04/24.    The 10-year ASCVD risk score (Arnett DK, et al., 2019) is: 15.9%    Assessment & Plan:   Problem List Items Addressed This Visit       Cardiovascular and Mediastinum   Essential hypertension (Chronic)   Patient currently maintained daily and lisinopril -hydrochlorothiazide  20-25 mg daily.  Blood pressure controlled.  Continue medication as prescribed      Relevant Orders   CBC with Differential/Platelet   Comprehensive metabolic panel with GFR   Hemoglobin A1c    TSH   Lipid panel   Atherosclerosis of aorta   Pending lipid panel.  Patient currently maintained on rosuvastatin  10 mg daily.      Relevant Orders   Lipid panel     Musculoskeletal and Integument   Birt-Hogg-Dube syndrome (Chronic)   Patient is already followed by dermatology.  Most recent imaging of kidneys was performed in 2024 we will repeat in 2026 recommended screenings every 12 to 36 months. Will do LDCT in the setting of tobacco abuse        Other   Preventative health care - Primary   Discussed age-appropriate immunizations and screening exams.  Did review patient's personal, surgical, social, family histories.  Patient is up-to-date with all age-appropriate vaccinations he would like.  Update flu vaccine today.  Patient is up-to-date on CRC screening.  Patient is followed by urology for prostate cancer.  Patient was given information at discharge about preventative healthcare maintenance with anticipatory guidance.      Relevant Orders   CBC with Differential/Platelet   Comprehensive metabolic panel with GFR   TSH   History of prostate cancer   Followed by urology continue      Mood disorder   Relevant Medications   sertraline  (ZOLOFT ) 50 MG tablet   Other Relevant Orders   VITAMIN D  25 Hydroxy (Vit-D Deficiency, Fractures)   Severe episode of recurrent major depressive disorder, without psychotic features (HCC)   Patient denies HI/SI/AVH.  Will start patient on sertraline  25 mg for 10 days then increase to 50 mg daily thereafter.      Relevant Medications   sertraline  (ZOLOFT ) 50 MG tablet   Other Relevant Orders   TSH   VITAMIN D  25 Hydroxy (Vit-D Deficiency, Fractures)   Tremor   Relevant Orders   Vitamin B12   Obesity (BMI 30-39.9)   History of same pending A1c, lipid panel, TSH.  Continue working healthy lifestyle modifications      Relevant Orders   Hemoglobin A1c   TSH   Lipid panel   Other Visit Diagnoses       Encounter for hepatitis C  screening test for low risk patient       Relevant Orders   Hepatitis C antibody     Screening for lung cancer       Relevant Orders   Ambulatory Referral Lung Cancer Screening [facility]       Return in about 6 weeks (around 04/15/2024) for MDD/memory .    Adina Crandall, NP  "

## 2024-03-05 LAB — HEPATITIS C ANTIBODY: Hepatitis C Ab: NONREACTIVE

## 2024-03-05 NOTE — Telephone Encounter (Signed)
 Called and spoke with patient. Relayed information. Pt verbalized understanding and has no questions or concerns at this time.

## 2024-03-09 ENCOUNTER — Ambulatory Visit: Payer: Self-pay | Admitting: Nurse Practitioner

## 2024-03-19 ENCOUNTER — Telehealth: Payer: Self-pay | Admitting: Acute Care

## 2024-03-19 DIAGNOSIS — Z122 Encounter for screening for malignant neoplasm of respiratory organs: Secondary | ICD-10-CM

## 2024-03-19 DIAGNOSIS — Z87891 Personal history of nicotine dependence: Secondary | ICD-10-CM

## 2024-03-19 NOTE — Telephone Encounter (Signed)
 Lung Cancer Screening Narrative/Criteria Questionnaire (Cigarette Smokers Only- No Cigars/Pipes/vapes)   Ernest Hudson   SDMV:03/25/2024 9:30a [name]       1956-01-31   LDCT: 03/26/2024 10:00 [facility]    69 y.o.   Phone: [phone]  Lung Screening Narrative (confirm age 41-77 yrs Medicare / 50-80 yrs Private pay insurance)   Insurance information:[employer]   Referring Provider:James Cable NP   This screening involves an initial phone call with a team member from our program. It is called a shared decision making visit. The initial meeting is required by  insurance and Medicare to make sure you understand the program. This appointment takes about 15-20 minutes to complete. You will complete the screening scan at your scheduled date/time.  This scan takes about 5-10 minutes to complete. You can eat and drink normally before and after the scan.  Criteria questions for Lung Cancer Screening:   Are you a current or former smoker? Former Age began smoking: 69 yo    If you are a former smoker, what year did you quit smoking? 2014, patient quit a couple times totaling 20 years quit but started up again before quitting again in 2014(within 15 yrs)   To calculate your smoking history, I need an accurate estimate of how many packs of cigarettes you smoked per day and for how many years. (Not just the number of PPD you are now smoking)   Years smoking 17 x Packs per day 2 = Pack years 34   (at least 20 pack yrs)   (Make sure they understand that we need to know how much they have smoked in the past, not just the number of PPD they are smoking now)  Do you have a personal history of cancer?  Yes - (type and when diagnosed - 5 yrs cancer free) Prostate, sx in 2022.     Do you have a family history of cancer? Yes  (cancer type and and relative) Brother - prostate, Father - prostate, mother - breast  Are you coughing up blood?  No  Have you had unexplained weight loss of 15 lbs or more in the  last 6 months? No  It looks like you meet all criteria.  When would be a good time for us  to schedule you for this screening?   Additional information: N/A

## 2024-03-25 ENCOUNTER — Encounter: Payer: Self-pay | Admitting: *Deleted

## 2024-03-25 ENCOUNTER — Ambulatory Visit: Admitting: *Deleted

## 2024-03-25 DIAGNOSIS — Z87891 Personal history of nicotine dependence: Secondary | ICD-10-CM | POA: Diagnosis not present

## 2024-03-25 NOTE — Patient Instructions (Signed)

## 2024-03-26 ENCOUNTER — Ambulatory Visit
Admission: RE | Admit: 2024-03-26 | Discharge: 2024-03-26 | Disposition: A | Source: Ambulatory Visit | Attending: Acute Care | Admitting: Acute Care

## 2024-03-26 ENCOUNTER — Other Ambulatory Visit: Payer: Self-pay | Admitting: Nurse Practitioner

## 2024-03-26 DIAGNOSIS — F332 Major depressive disorder, recurrent severe without psychotic features: Secondary | ICD-10-CM

## 2024-03-26 DIAGNOSIS — F39 Unspecified mood [affective] disorder: Secondary | ICD-10-CM

## 2024-03-26 DIAGNOSIS — Z122 Encounter for screening for malignant neoplasm of respiratory organs: Secondary | ICD-10-CM

## 2024-03-26 DIAGNOSIS — Z87891 Personal history of nicotine dependence: Secondary | ICD-10-CM

## 2024-04-15 ENCOUNTER — Ambulatory Visit: Admitting: Nurse Practitioner
